# Patient Record
Sex: Male | Born: 1945 | Race: White | Hispanic: No | Marital: Married | Smoking: Former smoker
Health system: Southern US, Community
[De-identification: ages and names within clinical notes are randomized; demographics above are authoritative.]

## PROBLEM LIST (undated history)

## (undated) DIAGNOSIS — R0789 Other chest pain: Secondary | ICD-10-CM

## (undated) DIAGNOSIS — Z87442 Personal history of urinary calculi: Secondary | ICD-10-CM

## (undated) DIAGNOSIS — K219 Gastro-esophageal reflux disease without esophagitis: Secondary | ICD-10-CM

## (undated) DIAGNOSIS — I2510 Atherosclerotic heart disease of native coronary artery without angina pectoris: Secondary | ICD-10-CM

## (undated) DIAGNOSIS — E785 Hyperlipidemia, unspecified: Secondary | ICD-10-CM

## (undated) DIAGNOSIS — G4730 Sleep apnea, unspecified: Secondary | ICD-10-CM

## (undated) HISTORY — DX: Hyperlipidemia, unspecified: E78.5

## (undated) HISTORY — PX: FINGER SURGERY: SHX640

## (undated) HISTORY — DX: Other chest pain: R07.89

## (undated) HISTORY — PX: CORONARY ARTERY BYPASS GRAFT: SHX141

## (undated) HISTORY — PX: NO PAST SURGERIES: SHX2092

## (undated) HISTORY — DX: Atherosclerotic heart disease of native coronary artery without angina pectoris: I25.10

---

## 2001-06-27 ENCOUNTER — Ambulatory Visit (HOSPITAL_COMMUNITY): Admission: RE | Admit: 2001-06-27 | Discharge: 2001-06-28 | Payer: Self-pay | Admitting: Cardiology

## 2001-06-27 ENCOUNTER — Encounter: Payer: Self-pay | Admitting: Cardiology

## 2001-08-30 ENCOUNTER — Ambulatory Visit (HOSPITAL_BASED_OUTPATIENT_CLINIC_OR_DEPARTMENT_OTHER): Admission: RE | Admit: 2001-08-30 | Discharge: 2001-08-30 | Payer: Self-pay | Admitting: Urology

## 2003-01-17 ENCOUNTER — Inpatient Hospital Stay (HOSPITAL_COMMUNITY): Admission: RE | Admit: 2003-01-17 | Discharge: 2003-01-21 | Payer: Self-pay | Admitting: Cardiology

## 2003-01-17 ENCOUNTER — Encounter: Payer: Self-pay | Admitting: Cardiology

## 2008-10-28 DIAGNOSIS — Z87442 Personal history of urinary calculi: Secondary | ICD-10-CM | POA: Insufficient documentation

## 2008-10-28 DIAGNOSIS — I25708 Atherosclerosis of coronary artery bypass graft(s), unspecified, with other forms of angina pectoris: Secondary | ICD-10-CM

## 2008-10-28 DIAGNOSIS — E088 Diabetes mellitus due to underlying condition with unspecified complications: Secondary | ICD-10-CM

## 2008-10-28 DIAGNOSIS — I25709 Atherosclerosis of coronary artery bypass graft(s), unspecified, with unspecified angina pectoris: Secondary | ICD-10-CM | POA: Insufficient documentation

## 2008-10-28 DIAGNOSIS — E785 Hyperlipidemia, unspecified: Secondary | ICD-10-CM

## 2008-10-28 HISTORY — DX: Diabetes mellitus due to underlying condition with unspecified complications: E08.8

## 2008-10-28 HISTORY — DX: Atherosclerosis of coronary artery bypass graft(s), unspecified, with other forms of angina pectoris: I25.708

## 2008-10-28 HISTORY — DX: Atherosclerosis of coronary artery bypass graft(s), unspecified, with unspecified angina pectoris: I25.709

## 2008-10-28 HISTORY — DX: Personal history of urinary calculi: Z87.442

## 2008-10-29 ENCOUNTER — Encounter: Payer: Self-pay | Admitting: Cardiology

## 2008-10-29 ENCOUNTER — Ambulatory Visit: Payer: Self-pay | Admitting: Cardiology

## 2008-11-03 ENCOUNTER — Inpatient Hospital Stay (HOSPITAL_COMMUNITY): Admission: RE | Admit: 2008-11-03 | Discharge: 2008-11-04 | Payer: Self-pay | Admitting: Cardiology

## 2008-11-03 ENCOUNTER — Ambulatory Visit: Payer: Self-pay | Admitting: Cardiology

## 2008-11-12 ENCOUNTER — Telehealth (INDEPENDENT_AMBULATORY_CARE_PROVIDER_SITE_OTHER): Payer: Self-pay | Admitting: *Deleted

## 2010-09-07 ENCOUNTER — Inpatient Hospital Stay (HOSPITAL_COMMUNITY)
Admission: AD | Admit: 2010-09-07 | Discharge: 2010-09-17 | DRG: 234 | Disposition: A | Discharge status: Home Health | Payer: Medicare Other | Source: Other Acute Inpatient Hospital | Attending: Thoracic Surgery (Cardiothoracic Vascular Surgery) | Admitting: Thoracic Surgery (Cardiothoracic Vascular Surgery)

## 2010-09-07 DIAGNOSIS — E875 Hyperkalemia: Secondary | ICD-10-CM | POA: Diagnosis not present

## 2010-09-07 DIAGNOSIS — I2510 Atherosclerotic heart disease of native coronary artery without angina pectoris: Secondary | ICD-10-CM | POA: Diagnosis present

## 2010-09-07 DIAGNOSIS — I2582 Chronic total occlusion of coronary artery: Secondary | ICD-10-CM | POA: Diagnosis present

## 2010-09-07 DIAGNOSIS — Z9861 Coronary angioplasty status: Secondary | ICD-10-CM

## 2010-09-07 DIAGNOSIS — E119 Type 2 diabetes mellitus without complications: Secondary | ICD-10-CM | POA: Diagnosis present

## 2010-09-07 DIAGNOSIS — D62 Acute posthemorrhagic anemia: Secondary | ICD-10-CM | POA: Diagnosis not present

## 2010-09-07 DIAGNOSIS — E785 Hyperlipidemia, unspecified: Secondary | ICD-10-CM | POA: Diagnosis present

## 2010-09-07 DIAGNOSIS — Z7902 Long term (current) use of antithrombotics/antiplatelets: Secondary | ICD-10-CM

## 2010-09-07 DIAGNOSIS — G4733 Obstructive sleep apnea (adult) (pediatric): Secondary | ICD-10-CM | POA: Diagnosis present

## 2010-09-07 DIAGNOSIS — I214 Non-ST elevation (NSTEMI) myocardial infarction: Principal | ICD-10-CM | POA: Diagnosis present

## 2010-09-07 DIAGNOSIS — I10 Essential (primary) hypertension: Secondary | ICD-10-CM | POA: Diagnosis present

## 2010-09-07 DIAGNOSIS — I252 Old myocardial infarction: Secondary | ICD-10-CM

## 2010-09-07 DIAGNOSIS — Z87891 Personal history of nicotine dependence: Secondary | ICD-10-CM

## 2010-09-07 DIAGNOSIS — N179 Acute kidney failure, unspecified: Secondary | ICD-10-CM | POA: Diagnosis not present

## 2010-09-07 DIAGNOSIS — K560 Paralytic ileus: Secondary | ICD-10-CM | POA: Diagnosis not present

## 2010-09-07 DIAGNOSIS — I200 Unstable angina: Secondary | ICD-10-CM

## 2010-09-07 LAB — CARDIAC PANEL(CRET KIN+CKTOT+MB+TROPI)
CK, MB: 3 ng/mL (ref 0.3–4.0)
Relative Index: 2.3 (ref 0.0–2.5)
Troponin I: 0.17 ng/mL — ABNORMAL HIGH (ref 0.00–0.06)

## 2010-09-07 LAB — PLATELET INHIBITION P2Y12
P2Y12 % Inhibition: 13 %
Platelet Function  P2Y12: 219 [PRU] (ref 194–418)
Platelet Function Baseline: 253 [PRU] (ref 194–418)

## 2010-09-07 LAB — HEPARIN LEVEL (UNFRACTIONATED): Heparin Unfractionated: <0.1 IU/mL — ABNORMAL LOW (ref 0.30–0.70)

## 2010-09-07 LAB — GLUCOSE, CAPILLARY: Glucose-Capillary: 189 mg/dL — ABNORMAL HIGH (ref 70–99)

## 2010-09-08 ENCOUNTER — Inpatient Hospital Stay (HOSPITAL_COMMUNITY): Payer: Medicare Other

## 2010-09-08 DIAGNOSIS — I517 Cardiomegaly: Secondary | ICD-10-CM

## 2010-09-08 DIAGNOSIS — I2510 Atherosclerotic heart disease of native coronary artery without angina pectoris: Secondary | ICD-10-CM

## 2010-09-08 DIAGNOSIS — Z01810 Encounter for preprocedural cardiovascular examination: Secondary | ICD-10-CM

## 2010-09-08 LAB — COMPREHENSIVE METABOLIC PANEL
AST: 23 U/L (ref 0–37)
Albumin: 3.5 g/dL (ref 3.5–5.2)
Calcium: 9.5 mg/dL (ref 8.4–10.5)
Chloride: 106 mEq/L (ref 96–112)
Creatinine, Ser: 1.27 mg/dL (ref 0.4–1.5)
GFR calc Af Amer: >60 mL/min (ref >60)
Total Protein: 6.1 g/dL (ref 6.0–8.3)

## 2010-09-08 LAB — BLOOD GAS, ARTERIAL
Bicarbonate: 23.9 mEq/L (ref 20.0–24.0)
Drawn by: 270271
FIO2: 0.21 %
Patient temperature: 98.6
pH, Arterial: 7.413 (ref 7.350–7.450)
pO2, Arterial: 64.9 mmHg — ABNORMAL LOW (ref 80.0–100.0)

## 2010-09-08 LAB — LIPID PANEL
HDL: 29 mg/dL — ABNORMAL LOW (ref >39)
Total CHOL/HDL Ratio: 5.3 RATIO
Triglycerides: 461 mg/dL — ABNORMAL HIGH (ref <150)
VLDL: UNDETERMINED mg/dL (ref 0–40)

## 2010-09-08 LAB — TSH: TSH: 1.569 u[IU]/mL (ref 0.350–4.500)

## 2010-09-08 LAB — PROTIME-INR: Prothrombin Time: 13.6 seconds (ref 11.6–15.2)

## 2010-09-08 LAB — CBC
MCH: 27.5 pg (ref 26.0–34.0)
MCHC: 31.5 g/dL (ref 30.0–36.0)
Platelets: 259 10*3/uL (ref 150–400)
RBC: 4.77 MIL/uL (ref 4.22–5.81)
RDW: 13.4 % (ref 11.5–15.5)

## 2010-09-08 LAB — GLUCOSE, CAPILLARY
Glucose-Capillary: 167 mg/dL — ABNORMAL HIGH (ref 70–99)
Glucose-Capillary: 99 mg/dL (ref 70–99)
Glucose-Capillary: 114 mg/dL — ABNORMAL HIGH (ref 70–99)

## 2010-09-08 LAB — ABO/RH: ABO/RH(D): O POS

## 2010-09-08 LAB — CARDIAC PANEL(CRET KIN+CKTOT+MB+TROPI): Troponin I: 0.23 ng/mL — ABNORMAL HIGH (ref 0.00–0.06)

## 2010-09-08 LAB — HEMOGLOBIN A1C: Mean Plasma Glucose: 148 mg/dL — ABNORMAL HIGH (ref <117)

## 2010-09-08 NOTE — H&P (Signed)
NAME:  Glenn Roach, Glenn Roach NO.:  [id number]  MEDICAL RECORD NO.:  [id number]           PATIENT TYPE:  I  LOCATION:  2024                         FACILITY:  [facility]  PHYSICIAN:  Glenn Sidle, MD DATE OF BIRTH:  Jul 03, 1946  DATE OF ADMISSION:  09/07/2010 DATE OF DISCHARGE:                             HISTORY & PHYSICAL   PRIMARY CARDIOLOGIST:  Aundra Dubin. Revankar, MD  REASON FOR ADMISSION:  Unstable angina.  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  Please refer to the cardiac catheterization reports dating back to 2002 for details.  His most recent intervention was in April 2010 at which time he underwent placement of a drug-eluting stent within the right coronary artery with percutaneous intervention to the left anterior descending.  He follows with Dr. Tomie Roach and states that generally he has been feeling well until the last 24 hours.  Yesterday, he states that after walking up 1 flight of steps he felt unusually short of breath.  Later on that evening, he had some mild chest discomfort, however it resolved spontaneously.  Early this morning at around 6:20 he awoke with chest pressure reminiscent of angina, lasted for several minutes, and ultimately resolved with 2 nitroglycerin sprays.  With minimal exertion, he had recurrent symptoms over the morning and ultimately presented to the [facility] Emergency Department for further evaluation.  With low-level chest pain, his ECG showed a fairly impressive anterolateral ST-segment depression, which resolved following additional treatment  with nitroglycerin, and after being placed on heparin.  Cardiac markers have been equivocal so far with troponin-I level of 0.06, and he has not had any recurrent symptoms as yet.  He was transferred to [facility] for further management, and remains comfortable at this point.  ALLERGIES: 1. ALTACE. 2. MORPHINE. 3. History of neutropenia on TICLID.  MEDICATIONS: 1. Nitroglycerin spray p.r.n. 2. Enteric-coated aspirin 81 mg p.o. daily. 3. Centrum multivitamin one p.o. daily. 4. Toprol XL 50 mg p.o. daily. 5. Lipitor 40 mg p.o. at bedtime. 6. Janumet 50/1000 mg p.o. b.i.d. with meals. 7. Plavix 75 mg p.o. daily. 8. Omega-3 supplements 2000 mg p.o. b.i.d. 9. Pantoprazole 40 mg p.o. daily. 10.Exforge 04/319 mg p.o. daily. 11.Glimepiride 1 mg p.o. daily. 12.Fenofibrate 160 mg p.o. daily. At the present time he is also on heparin infusion and nitroglycerin paste.  PAST MEDICAL HISTORY:  Discussed above.  Records indicate placement of a bare-metal stent initially in the left anterior descending, subsequently bare-metal stents in the right coronary artery and mid left anterior descending with subsequent placement of drug-eluting stents in the proximal to mid RCA as well as proximal to mid LAD related to in-stent restenosis.  As noted above, most recent intervention was in 2010. Additional history includes nephrolithiasis requiring lithotripsy, occasional reflux symptoms, previous hand surgery.  SOCIAL HISTORY:  The patient is  married, lives in [place].  He is a retired Marine scientist.  Has a 25 pack-year history of tobacco use but quit smoking cigarettes in 1996.  No regular alcohol.  No regular exercise at this point.  FAMILY HISTORY:  The patient's mother died in her 50s with congestive heart failure and his father died in his 94s with a history of stroke. Two brothers with no clear history of cardiovascular disease.  REVIEW OF SYSTEMS:  As detailed above.  No recent fevers or  chills, no cough or hemoptysis.  No reported bleeding problems on aspirin and Plavix.  He does state that his statin medication was switched from Lipitor to Crestor and he had significant gastrointestinal side effects with this.  He is now working his Lipitor dose back up gradually to 80 mg a day, currently at 40 mg a day.  No melena or hematochezia. Otherwise reviewed and negative.  PHYSICAL EXAMINATION:  VITAL SIGNS:  Temperature is 98.1 degrees, heart rate 62 in sinus rhythm, respirations 18, blood pressure is 136/79, oxygen saturation is 98% on 2 L nasal cannula.  Weight is 96 kg. GENERAL:  This is an obese male in no acute distress without active chest pain. HEENT: Conjunctivae and lids are grossly normal.  Oropharynx clear. NECK:  Supple.  No elevated JVP or carotid bruits.  No thyromegaly. LUNGS:  Clear with clear without labored breathing at rest. CARDIAC:  Regular rate and rhythm, soft S4, no significant systolic murmur or pericardial rub. ABDOMEN:  Nontender.  Bowel sounds present. EXTREMITIES:  Exhibit no significant pitting edema.  Femorals and dorsalis pedis pulses as well as radial pulses are normal. SKIN:  Warm and dry. MUSCULOSKELETAL:  No kyphosis is noted. NEUROPSYCHIATRIC:  The patient is alert and oriented x3.  Affect is appropriate.  Laboratory data from [facility] shows a troponin-I level of 0.06, total CK of 96.  Sodium 142, potassium 5.0, chloride 105, glucose 165, BUN 20, creatinine 1.1, albumin 4.3.  ProBNP 245, AST 25, ALT 38, WBCs 5.8, hemoglobin 13.8, hematocrit 41.8, platelets 271.  C[facility] done at [facility] earlier today reports cardiomegaly with no active disease.  IMPRESSION: 1. Symptoms consistent with unstable angina, onset within the last 24     hours, and associated with dynamic ST-segment depression     principally in the anterolateral leads.  Troponin-I level from     [place] was in the equivocal range of 0.06, and at this point  he     is symptom free on nitroglycerin paste and heparin.  He reports     compliance with his medications, in particular dual antiplatelet     therapy, and has not had any progressive symptoms leading up to     this presentation. 2. Coronary artery disease status post remote myocardial infarction in     1996, status post multiple percutaneous interventions to the left     anterior descending and right coronary artery over the years, most     recently in 2010 with placement of a drug-eluting stent within the     right coronary artery and angioplasty to the left anterior     descending. 3. Type 2 diabetes mellitus. 4. Hypertension. 5. Hyperlipidemia.  The patient reports significant gastrointestinal     side effects related to Crestor, tolerating Lipitor at this point. 6. History of neutropenia on TICLID. 7. MORPHINE allergy.  PLAN:  The patient is now admitted to the telemetry unit, and is symptomatically and hemodynamically stable.  We will continue his home  medications with the exception of Janumet which will be held in anticipation of angiography, and otherwise continue nitroglycerin paste and heparin.  Labs will be repeated including continued cycling of cardiac markers and a PT/INR.  He will be scheduled for a diagnostic cardiac catheterization for tomorrow morning for reassessment of coronary anatomy and to assess for potential revascularizations options.     Glenn Sidle, MD     SGM/MEDQ  D:  09/07/2010  T:  09/07/2010  Job:  [id number]  cc:   Aundra Dubin. Revankar, M.D.  Electronically Signed by Nona Dell MD on 09/08/2010 11:30:47 AM

## 2010-09-09 ENCOUNTER — Inpatient Hospital Stay (HOSPITAL_COMMUNITY): Payer: Medicare Other

## 2010-09-09 DIAGNOSIS — I2510 Atherosclerotic heart disease of native coronary artery without angina pectoris: Secondary | ICD-10-CM

## 2010-09-09 LAB — POCT I-STAT 4, (NA,K, GLUC, HGB,HCT)
Glucose, Bld: 128 mg/dL — ABNORMAL HIGH (ref 70–99)
Glucose, Bld: 111 mg/dL — ABNORMAL HIGH (ref 70–99)
Glucose, Bld: 127 mg/dL — ABNORMAL HIGH (ref 70–99)
Glucose, Bld: 143 mg/dL — ABNORMAL HIGH (ref 70–99)
HCT: 40 % (ref 39.0–52.0)
HCT: 38 % — ABNORMAL LOW (ref 39.0–52.0)
HCT: 27 % — ABNORMAL LOW (ref 39.0–52.0)
HCT: 32 % — ABNORMAL LOW (ref 39.0–52.0)
Hemoglobin: 13.6 g/dL (ref 13.0–17.0)
Hemoglobin: 10.2 g/dL — ABNORMAL LOW (ref 13.0–17.0)
Hemoglobin: 9.2 g/dL — ABNORMAL LOW (ref 13.0–17.0)
Potassium: 4.3 mEq/L (ref 3.5–5.1)
Potassium: 5.5 mEq/L — ABNORMAL HIGH (ref 3.5–5.1)
Potassium: 4.4 mEq/L (ref 3.5–5.1)
Sodium: 141 mEq/L (ref 135–145)
Sodium: 140 mEq/L (ref 135–145)
Sodium: 135 mEq/L (ref 135–145)

## 2010-09-09 LAB — POCT I-STAT, CHEM 8
Creatinine, Ser: 1.3 mg/dL (ref 0.4–1.5)
Glucose, Bld: 159 mg/dL — ABNORMAL HIGH (ref 70–99)
HCT: 30 % — ABNORMAL LOW (ref 39.0–52.0)
Hemoglobin: 10.2 g/dL — ABNORMAL LOW (ref 13.0–17.0)
TCO2: 20 mmol/L (ref 0–100)

## 2010-09-09 LAB — POCT I-STAT 3, ART BLOOD GAS (G3+)
Bicarbonate: 24.7 mEq/L — ABNORMAL HIGH (ref 20.0–24.0)
O2 Saturation: 89 %
Patient temperature: 36.2
Patient temperature: 37.9
TCO2: 26 mmol/L (ref 0–100)
TCO2: 25 mmol/L (ref 0–100)
pCO2 arterial: 46.9 mmHg — ABNORMAL HIGH (ref 35.0–45.0)
pH, Arterial: 7.329 — ABNORMAL LOW (ref 7.350–7.450)
pH, Arterial: 7.332 — ABNORMAL LOW (ref 7.350–7.450)
pH, Arterial: 7.289 — ABNORMAL LOW (ref 7.350–7.450)
pO2, Arterial: 251 mmHg — ABNORMAL HIGH (ref 80.0–100.0)

## 2010-09-09 LAB — HEPARIN LEVEL (UNFRACTIONATED): Heparin Unfractionated: <0.1 IU/mL — ABNORMAL LOW (ref 0.30–0.70)

## 2010-09-09 LAB — MRSA PCR SCREENING: MRSA by PCR: NEGATIVE

## 2010-09-09 LAB — COMPREHENSIVE METABOLIC PANEL
AST: 22 U/L (ref 0–37)
Albumin: 3.8 g/dL (ref 3.5–5.2)
BUN: 11 mg/dL (ref 6–23)
Calcium: 9.4 mg/dL (ref 8.4–10.5)
Chloride: 109 mEq/L (ref 96–112)
Creatinine, Ser: 1.23 mg/dL (ref 0.4–1.5)
GFR calc Af Amer: >60 mL/min (ref >60)
Total Bilirubin: 0.7 mg/dL (ref 0.3–1.2)
Total Protein: 7 g/dL (ref 6.0–8.3)

## 2010-09-09 LAB — CBC
HCT: 31.7 % — ABNORMAL LOW (ref 39.0–52.0)
Hemoglobin: 13.6 g/dL (ref 13.0–17.0)
MCH: 28.3 pg (ref 26.0–34.0)
MCH: 28.2 pg (ref 26.0–34.0)
MCH: 27.8 pg (ref 26.0–34.0)
MCHC: 31.8 g/dL (ref 30.0–36.0)
MCV: 86.9 fL (ref 78.0–100.0)
MCV: 86.8 fL (ref 78.0–100.0)
Platelets: 244 10*3/uL (ref 150–400)
Platelets: 218 10*3/uL (ref 150–400)
RBC: 4.8 MIL/uL (ref 4.22–5.81)
RDW: 13.4 % (ref 11.5–15.5)
RDW: 13.4 % (ref 11.5–15.5)
WBC: 5.8 10*3/uL (ref 4.0–10.5)
WBC: 9.5 10*3/uL (ref 4.0–10.5)

## 2010-09-09 LAB — URINALYSIS, ROUTINE W REFLEX MICROSCOPIC
Bilirubin Urine: NEGATIVE
Nitrite: NEGATIVE
Specific Gravity, Urine: 1.018 (ref 1.005–1.030)
Urobilinogen, UA: 1 mg/dL (ref 0.0–1.0)
pH: 6 (ref 5.0–8.0)

## 2010-09-09 LAB — GLUCOSE, CAPILLARY
Glucose-Capillary: 104 mg/dL — ABNORMAL HIGH (ref 70–99)
Glucose-Capillary: 88 mg/dL (ref 70–99)
Glucose-Capillary: 144 mg/dL — ABNORMAL HIGH (ref 70–99)

## 2010-09-09 LAB — HEMOGLOBIN AND HEMATOCRIT, BLOOD
HCT: 28.6 % — ABNORMAL LOW (ref 39.0–52.0)
Hemoglobin: 9.3 g/dL — ABNORMAL LOW (ref 13.0–17.0)

## 2010-09-09 LAB — CREATININE, SERUM: GFR calc Af Amer: >60 mL/min (ref >60)

## 2010-09-09 LAB — APTT: aPTT: 30 seconds (ref 24–37)

## 2010-09-09 NOTE — Consult Note (Signed)
NAME:  Glenn Roach, AUXIER NO.:  [id number]  MEDICAL RECORD NO.:  [id number]           PATIENT TYPE:  I  LOCATION:  2024                         FACILITY:  [facility]  PHYSICIAN:  Salvatore Decent. Cornelius Moras, M.D. DATE OF BIRTH:  04/26/1946  DATE OF CONSULTATION:  09/08/2010 DATE OF DISCHARGE:                                CONSULTATION   REQUESTING PHYSICIAN:  Arturo Morton. Riley Kill, MD, [facility]  REASON FOR CONSULTATION:  Severe two-vessel coronary artery disease with class I unstable angina.  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  The patient's cardiac history dates back more than 15 years ago when he suffered an anterior wall myocardial infarction.  He was treated with PCI and stenting of both his left anterior descending coronary artery and the right coronary artery.  He has had numerous percutaneous coronary interventions since then, most recently in 2010 by Dr. Juanda Chance when stenosis of the distal right coronary artery was treated percutaneously.  The patient has continued to do well until yesterday morning.  He developed sudden onset of substernal chest tightness associated with shortness of breath.  Symptoms initially responded to sublingual nitroglycerin, but symptoms recurred on 3 more occasions ultimately prompting him to present to [facility].  Baseline electrocardiogram initially demonstrated some ST-segment depression across the anterolateral leads, but these ST changes resolved with medical therapy.  Cardiac enzymes have been borderline elevated consistent with unstable angina.  The patient was transferred to [facility] for further management.  Cardiac catheterization performed by Dr. Riley Kill this morning demonstrates severe two-vessel coronary artery disease with occlusion of the mid left  anterior descending coronary artery and high-grade 95% stenosis of mid right coronary artery.  Left ventricular function appears preserved.  The patient has had 2-3 brief episodes of substernal chest pain despite intravenous heparin since catheterization.  At present, the patient is pain free and elective cardiac surgical consultation has been requested.  REVIEW OF SYSTEMS:  GENERAL:  The patient reports normal appetite.  He has not been gaining nor losing weight recently.  RESPIRATORY: Negative.  The patient has intermittent nonproductive cough.  CARDIAC: Notable for new onset symptoms of unstable angina beginning yesterday. Prior to that, the patient reports feeling his usual self.  No recent history of any sort of chest pain nor shortness of breath.  The patient denies PND, orthopnea, or lower extremity edema.  The patient has not had dizzy spells or palpitations.  GASTROINTESTINAL:  Negative.  The patient has no difficulty swallowing.  He reports normal bowel function. He has occasional symptoms of reflux.  MUSCULOSKELETAL:  Negative. NEUROLOGIC:  Negative.  GENITOURINARY:  Negative.  HEENT:  Negative.  PAST MEDICAL HISTORY: 1. Coronary artery disease. 2. Hypertension. 3. Type 2 diabetes mellitus. 4. Hyperlipidemia. 5. Remote tobacco use. 6. GE reflux disease.  PAST SURGICAL HISTORY: 1. Right hand surgery. 2. Repair of blocked lacrimal ducts during childhood.  FAMILY HISTORY:  Noncontributory.  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  He lives a somewhat sedentary lifestyle.  He has a remote history of tobacco use, but he quit smoking 15 years ago.  He reports only social alcohol use.  MEDICATIONS PRIOR TO ADMISSION: 1. Lipitor 80 mg daily. 2. Metoprolol 50 mg daily. 3. Plavix 75 mg daily. 4. Fenofibrate 160 mg daily. 5. Iron. 6. Aspirin. 7. Exforge.. 8. Janumet 50/1000 one tablet twice daily with meals. 9. Fish oil  capsule twice daily. 10.Glimepiride 1 mg daily. 11.Multivitamin. 12.Protonix 40 mg daily. 13.Hydrocodone as needed. 14.Nitroglycerin spray as needed.  DRUG ALLERGIES: 1. ALTACE causes cough. 2. MORPHINE causes nausea. 3. TICLID causes neutropenia.  PHYSICAL EXAMINATION:  GENERAL:  The patient is a well-appearing, moderately obese male who appears his stated age, in no acute distress. HEENT:  Unrevealing. NECK:  Supple.  There is no cervical lymphadenopathy.  There is no jugular venous distention.  No carotid bruits noted. CHEST:  Auscultation of the chest demonstrates clear breath sounds that are symmetrical bilaterally.  No wheezes, rales, or rhonchi noted. CARDIOVASCULAR:  Regular rate and rhythm.  No murmurs, rubs, or gallops are appreciated. ABDOMEN:  Moderately obese, but soft and nontender.  Bowel sounds are present.  There are no palpable masses. EXTREMITIES:  Warm and well perfused.  There is no lower extremity edema.  Distal pulses are palpable in the posterior tibial position, both lower legs and at the ankle. RECTAL AND GU:  Both deferred. NEUROLOGIC:  Grossly nonfocal and symmetrical throughout.  DIAGNOSTIC TESTS:  Cardiac catheterization performed by Dr. Riley Kill is reviewed.  This is compared with previous catheterizations from 2010. Today's catheterization reveals 100% occlusion of the mid left anterior descending coronary artery.  There is high-grade 95% stenosis of mid right coronary artery.  There is right dominant coronary circulation. The distal left anterior descending coronary artery is faintly visualized on right-to-left collaterals, but not visualized well.  Left ventricular function appears preserved.  LABORATORY TESTS:  Routine bloodwork are all normal.  Plavix platelet function is assessed with the P2Y12 assay, and baseline platelet function is within normal range with only 13% functional inhibition.  IMPRESSION:  Severe two-vessel coronary artery  disease with restenosis and stent thrombosis following multiple previous percutaneous coronary interventions.  The patient has unstable angina.  Left ventricular function is preserved.  I agree that Mr. Gilmer would best be treated with surgical revascularization.  The patient has been taking Plavix but appears to have very low functional inhibition of platelet function.  PLAN:  I have discussed options at length with Mr. Petion and his family.  Alternative treatment strategies have been discussed.  They understand and accept all associated risks of surgery including but not limited to risks of death, stroke, myocardial infarction, congestive heart failure, respiratory failure, pneumonia, bleeding requiring blood transfusion, arrhythmia, infection, and late recurrence of coronary artery disease.  All of their questions have been addressed.  We plan to proceed with surgery tomorrow.     Salvatore Decent. Cornelius Moras, M.D.     CHO/MEDQ  D:  09/08/2010  T:  09/09/2010  Job:  [id number]  cc:   Aundra Dubin. Revankar, M.D. Beulah Gandy. Sheria Lang, MD  Electronically Signed by Tressie Stalker M.D. on 09/09/2010 06:33:32 AM

## 2010-09-10 ENCOUNTER — Inpatient Hospital Stay (HOSPITAL_COMMUNITY): Payer: Medicare Other

## 2010-09-10 LAB — CBC
HCT: 32.2 % — ABNORMAL LOW (ref 39.0–52.0)
Hemoglobin: 10.1 g/dL — ABNORMAL LOW (ref 13.0–17.0)
MCH: 27.9 pg (ref 26.0–34.0)
MCV: 89 fL (ref 78.0–100.0)
RBC: 3.62 MIL/uL — ABNORMAL LOW (ref 4.22–5.81)

## 2010-09-10 LAB — PREPARE FRESH FROZEN PLASMA
Unit division: 0
Unit division: 0

## 2010-09-10 LAB — PREPARE PLATELETS: Unit division: 0

## 2010-09-10 LAB — MAGNESIUM: Magnesium: 3.3 mg/dL — ABNORMAL HIGH (ref 1.5–2.5)

## 2010-09-10 LAB — BASIC METABOLIC PANEL
Calcium: 7.8 mg/dL — ABNORMAL LOW (ref 8.4–10.5)
GFR calc Af Amer: 44 mL/min — ABNORMAL LOW (ref >60)
GFR calc non Af Amer: 36 mL/min — ABNORMAL LOW (ref >60)
Potassium: 4.4 mEq/L (ref 3.5–5.1)
Sodium: 137 mEq/L (ref 135–145)

## 2010-09-10 LAB — GLUCOSE, CAPILLARY: Glucose-Capillary: 189 mg/dL — ABNORMAL HIGH (ref 70–99)

## 2010-09-11 ENCOUNTER — Inpatient Hospital Stay (HOSPITAL_COMMUNITY): Payer: Medicare Other

## 2010-09-11 LAB — BASIC METABOLIC PANEL
BUN: 28 mg/dL — ABNORMAL HIGH (ref 6–23)
CO2: 25 mEq/L (ref 19–32)
Chloride: 105 mEq/L (ref 96–112)
Chloride: 101 mEq/L (ref 96–112)
Creatinine, Ser: 2.48 mg/dL — ABNORMAL HIGH (ref 0.4–1.5)
GFR calc Af Amer: 32 mL/min — ABNORMAL LOW (ref >60)
GFR calc Af Amer: 31 mL/min — ABNORMAL LOW (ref >60)
GFR calc non Af Amer: 26 mL/min — ABNORMAL LOW (ref >60)
Potassium: 4.5 mEq/L (ref 3.5–5.1)
Sodium: 134 mEq/L — ABNORMAL LOW (ref 135–145)

## 2010-09-11 LAB — GLUCOSE, CAPILLARY
Glucose-Capillary: 122 mg/dL — ABNORMAL HIGH (ref 70–99)
Glucose-Capillary: 131 mg/dL — ABNORMAL HIGH (ref 70–99)
Glucose-Capillary: 168 mg/dL — ABNORMAL HIGH (ref 70–99)

## 2010-09-11 LAB — CBC
MCH: 28.1 pg (ref 26.0–34.0)
MCV: 89.8 fL (ref 78.0–100.0)
Platelets: 225 10*3/uL (ref 150–400)
RBC: 3.42 MIL/uL — ABNORMAL LOW (ref 4.22–5.81)
RDW: 14.1 % (ref 11.5–15.5)
WBC: 12 10*3/uL — ABNORMAL HIGH (ref 4.0–10.5)

## 2010-09-11 LAB — CROSSMATCH
ABO/RH(D): O POS
Antibody Screen: NEGATIVE
Unit division: 0

## 2010-09-12 ENCOUNTER — Inpatient Hospital Stay (HOSPITAL_COMMUNITY): Payer: Medicare Other

## 2010-09-12 LAB — CBC
Hemoglobin: 9.4 g/dL — ABNORMAL LOW (ref 13.0–17.0)
MCH: 28.2 pg (ref 26.0–34.0)
Platelets: 228 10*3/uL (ref 150–400)
RBC: 3.33 MIL/uL — ABNORMAL LOW (ref 4.22–5.81)
WBC: 9.5 10*3/uL (ref 4.0–10.5)

## 2010-09-12 LAB — BASIC METABOLIC PANEL
BUN: 37 mg/dL — ABNORMAL HIGH (ref 6–23)
CO2: 27 mEq/L (ref 19–32)
Chloride: 105 mEq/L (ref 96–112)
Chloride: 101 mEq/L (ref 96–112)
Creatinine, Ser: 2.6 mg/dL — ABNORMAL HIGH (ref 0.4–1.5)
GFR calc Af Amer: 30 mL/min — ABNORMAL LOW (ref >60)
GFR calc non Af Amer: 31 mL/min — ABNORMAL LOW (ref >60)
Glucose, Bld: 216 mg/dL — ABNORMAL HIGH (ref 70–99)
Potassium: 4.9 mEq/L (ref 3.5–5.1)
Sodium: 138 mEq/L (ref 135–145)

## 2010-09-12 LAB — GLUCOSE, CAPILLARY
Glucose-Capillary: 138 mg/dL — ABNORMAL HIGH (ref 70–99)
Glucose-Capillary: 140 mg/dL — ABNORMAL HIGH (ref 70–99)

## 2010-09-13 LAB — CBC
HCT: 28.3 % — ABNORMAL LOW (ref 39.0–52.0)
MCV: 87.1 fL (ref 78.0–100.0)
RBC: 3.25 MIL/uL — ABNORMAL LOW (ref 4.22–5.81)
WBC: 7.3 10*3/uL (ref 4.0–10.5)

## 2010-09-13 LAB — GLUCOSE, CAPILLARY

## 2010-09-13 LAB — BASIC METABOLIC PANEL
BUN: 34 mg/dL — ABNORMAL HIGH (ref 6–23)
CO2: 27 mEq/L (ref 19–32)
Chloride: 103 mEq/L (ref 96–112)
Glucose, Bld: 130 mg/dL — ABNORMAL HIGH (ref 70–99)
Potassium: 4 mEq/L (ref 3.5–5.1)

## 2010-09-14 ENCOUNTER — Inpatient Hospital Stay (HOSPITAL_COMMUNITY): Payer: Medicare Other

## 2010-09-14 LAB — BASIC METABOLIC PANEL
Calcium: 8.8 mg/dL (ref 8.4–10.5)
GFR calc Af Amer: 53 mL/min — ABNORMAL LOW (ref >60)
GFR calc non Af Amer: 44 mL/min — ABNORMAL LOW (ref >60)
Glucose, Bld: 133 mg/dL — ABNORMAL HIGH (ref 70–99)
Sodium: 139 mEq/L (ref 135–145)

## 2010-09-14 LAB — GLUCOSE, CAPILLARY

## 2010-09-14 LAB — CBC
HCT: 29.4 % — ABNORMAL LOW (ref 39.0–52.0)
MCHC: 32.3 g/dL (ref 30.0–36.0)
RDW: 14.2 % (ref 11.5–15.5)

## 2010-09-15 LAB — BASIC METABOLIC PANEL
GFR calc Af Amer: 59 mL/min — ABNORMAL LOW (ref >60)
GFR calc non Af Amer: 48 mL/min — ABNORMAL LOW (ref >60)
GFR calc non Af Amer: 49 mL/min — ABNORMAL LOW (ref >60)
Glucose, Bld: 130 mg/dL — ABNORMAL HIGH (ref 70–99)
Glucose, Bld: 181 mg/dL — ABNORMAL HIGH (ref 70–99)
Potassium: 4.2 mEq/L (ref 3.5–5.1)
Potassium: 4.1 mEq/L (ref 3.5–5.1)
Sodium: 137 mEq/L (ref 135–145)
Sodium: 136 mEq/L (ref 135–145)

## 2010-09-15 LAB — GLUCOSE, CAPILLARY
Glucose-Capillary: 150 mg/dL — ABNORMAL HIGH (ref 70–99)
Glucose-Capillary: 153 mg/dL — ABNORMAL HIGH (ref 70–99)
Glucose-Capillary: 123 mg/dL — ABNORMAL HIGH (ref 70–99)

## 2010-09-16 LAB — BASIC METABOLIC PANEL
BUN: 22 mg/dL (ref 6–23)
CO2: 27 mEq/L (ref 19–32)
Calcium: 9.1 mg/dL (ref 8.4–10.5)
Chloride: 101 mEq/L (ref 96–112)
Creatinine, Ser: 1.36 mg/dL (ref 0.4–1.5)
GFR calc Af Amer: >60 mL/min (ref >60)
GFR calc non Af Amer: 52 mL/min — ABNORMAL LOW (ref >60)
Glucose, Bld: 136 mg/dL — ABNORMAL HIGH (ref 70–99)
Potassium: 4.3 mEq/L (ref 3.5–5.1)
Sodium: 136 mEq/L (ref 135–145)

## 2010-09-16 LAB — GLUCOSE, CAPILLARY

## 2010-09-16 NOTE — Op Note (Signed)
NAME:  Glenn Roach, Glenn Roach NO.:  [id number]  MEDICAL RECORD NO.:  [id number]           PATIENT TYPE:  LOCATION:                                 FACILITY:  PHYSICIAN:  Salvatore Decent. Cornelius Moras, M.D. DATE OF BIRTH:  Oct 28, 1945  DATE OF PROCEDURE:  09/09/2010 DATE OF DISCHARGE:                              OPERATIVE REPORT   PREOPERATIVE DIAGNOSIS:  Severe two-vessel coronary artery disease with class IV unstable angina.  POSTOPERATIVE DIAGNOSIS:  Severe two-vessel coronary artery disease with class IV unstable angina.  PROCEDURE:  Median sternotomy for coronary artery bypass grafting x3 (left internal mammary artery to distal left anterior descending coronary artery, saphenous vein graft to second diagonal branch, saphenous vein graft to right posterolateral branch, endoscopic saphenous vein harvest from right thigh).  SURGEON:  Salvatore Decent. Cornelius Moras, MD  ASSISTANT:  Doree Fudge, PA  ANESTHESIOLOGIST:  Burna Forts, MD  BRIEF CLINICAL NOTE:  The patient is a 65 year old male with history of coronary artery disease, hypertension, hyperlipidemia, and type 2 diabetes mellitus.  The patient presents with unstable angina.  Cardiac catheterization demonstrates severe two-vessel coronary artery disease with progression of disease following multiple previous percutaneous coronary intervention procedures.  There is normal left ventricular function.  A full consultation note has been dictated previously.  The patient has been counseled at length regarding the indications, risks, and potential benefits of surgery.  The patient provides full informed consent for the surgery as described.  OPERATIVE FINDINGS: 1. Normal left ventricular systolic function with moderate left     ventricular hypertrophy. 2. Good-quality left internal mammary artery and saphenous vein     conduit for grafting. 3. Fair-quality target vessels for grafting.  OPERATIVE PROCEDURE IN DETAIL:   The patient was brought to the operating room on the above-mentioned date and central monitoring was established by the anesthesia team under the care and direction of Dr. Sharee Holster.  Specifically, a Swan-Ganz catheter was placed through the right internal jugular approach.  A radial arterial line was placed. Intravenous antibiotics were administered.  Following induction with general endotracheal anesthesia, a Foley catheter was placed.  The patient's chest, abdomen, both groins, and both lower extremities were prepared and draped in sterile manner.  Baseline transesophageal echocardiogram was performed by Dr. Jacklynn Bue.  This demonstrates normal left ventricular systolic function.  There was moderate left ventricular hypertrophy.  A median sternotomy incision was performed and the left internal mammary artery was dissected from the chest wall and prepared for bypass grafting.  Left internal mammary artery was good-quality conduit. Simultaneously, saphenous vein was obtained from the patient's right thigh using endoscopic vein harvest technique through a small incision made just above the right knee.  The saphenous vein was good-quality conduit.  After removal of the saphenous vein, the small incision in the right lower extremity was closed with absorbable suture.  Following systemic heparinization, the left internal mammary artery was transected distally and noted to have excellent flow.  The pericardium was opened.  The ascending aorta was normal in appearance.  The ascending aorta and the right atrium were cannulated for cardiopulmonary bypass.  Cardiopulmonary bypass  was begun and distal target vessels were selected for coronary bypass grafting.  A cardioplegic cannula was placed in the ascending aorta and a temperature probe was placed in the interventricular septum.  The patient was allowed to cool passively to 32 degrees systemic temperature.  The aortic cross-clamp was  applied and cold blood cardioplegia was delivered in antegrade fashion through the aortic root. Iced saline slush was applied for topical hypothermia.  The initial cardioplegic arrest was rapid with early diastolic arrest.  Repeat doses of cardioplegia were administered intermittently throughout the entire cross-clamp portion of the operation through the aortic root and down the subsequently placed vein grafts to maintain completely flat electrocardiogram and left ventricular septal myocardial temperature below 15 degrees centigrade.  The following distal coronary anastomoses were performed: 1. The posterolateral branch off the distal right coronary artery is     grafted with a saphenous vein graft in an end-to-side fashion.     This vessel measured 1.7 mm in diameter and is a fair to good     quality target vessel for grafting.  There was diffuse disease     proximally. 2. The second diagonal branch off the left anterior descending     coronary artery is grafted with a saphenous vein graft in end-to-     side fashion.  This vessel measured 1.5 mm in diameter and is a     fair to good quality target vessel for grafting. 3. The distal left anterior descending coronary artery is grafted with     left internal mammary artery in an end-to-side fashion.  This     vessel measured 1.5 mm in diameter and is a fair to good quality     target vessel for grafting.  There was diffuse disease proximally.  Both proximal saphenous vein anastomoses were performed directly to the ascending aorta prior to removal of the aortic cross-clamp.  The left ventricular septal temperature rises rapidly with reperfusion of the left internal mammary artery graft.  The aortic cross-clamp was removed after a total cross-clamp time of 65 minutes.  The heart began to beat spontaneously without need for cardioversion.  All proximal and distal coronary anastomoses were inspected for hemostasis and appropriate  graft orientation.  Epicardial pacing wires were fixed to the right ventricular free wall into the right atrial appendage.  The patient was rewarmed to 37 degrees centigrade temperature.  The patient was weaned from cardiopulmonary bypass without difficulty.  The patient's rhythm at separation from bypass was sinus rhythm.  Total cardiopulmonary bypass time for the operation is 89 minutes.  No inotropic support was required.  Followup transesophageal echocardiogram performed by Dr. Jacklynn Bue after separation from bypass demonstrates preserved left ventricular function.  The venous and arterial cannulae were removed uneventfully.  Protamine was administered to reverse the anticoagulation.  There was mild coagulopathy.  The patient was transfused two packs of platelets, 2 units of fresh frozen plasma.  The mediastinum and the left pleural space were irrigated with saline solution.  Meticulous surgical hemostasis was ascertained.  The mediastinum and the left pleural space were drained with three chest tubes placed through separate stab incisions inferiorly.  The pericardium and soft tissues anterior to the aorta were reapproximated loosely.  The sternum was closed using double- strength sternal wire.  The soft tissues anterior to the sternum were closed in multiple layers and the skin was closed with a running subcuticular skin closure.  The patient tolerated the procedure well and was transported to the  surgical intensive care unit in stable condition.  There were no intraoperative complications.  All sponge, instrument, and needle counts were verified correct at completion of the operation.     Salvatore Decent. Cornelius Moras, M.D.     CHO/MEDQ  D:  09/09/2010  T:  09/10/2010  Job:  [id number]  cc:   Arturo Morton. Riley Kill, MD, [facility] Aundra Dubin. Revankar, M.D. Desmond Dike, MD  Electronically Signed by Tressie Stalker M.D. on 09/15/2010 03:37:16 PM

## 2010-09-17 LAB — GLUCOSE, CAPILLARY
Glucose-Capillary: 147 mg/dL — ABNORMAL HIGH (ref 70–99)
Glucose-Capillary: 125 mg/dL — ABNORMAL HIGH (ref 70–99)

## 2010-09-18 NOTE — Discharge Summary (Signed)
NAME:  Glenn Roach, LUHMAN NO.:  [id number]  MEDICAL RECORD NO.:  [id number]           PATIENT TYPE:  I  LOCATION:  2017                         FACILITY:  [facility]  PHYSICIAN:  Salvatore Decent. Cornelius Moras, M.D. DATE OF BIRTH:  24-Sep-1945  DATE OF ADMISSION:  09/07/2010 DATE OF DISCHARGE:                              DISCHARGE SUMMARY   ADMITTING DIAGNOSES: 1. History of coronary artery disease (status post percutaneous     coronary intervention and stent of his left anterior descending     artery and right coronary artery).  Previous history of myocardial     infarction. 2. Non-ST segment elevation myocardial infarction. 3. History of hypertension. 4. History of hyperlipidemia. 5. History of diabetes mellitus. 6. History of tobacco abuse. 7. He history of obesity. 8. History of gastroesophageal reflux disease. 9. History of remote tobacco abuse.  DISCHARGE DIAGNOSES: 1. History of coronary artery disease (status post percutaneous     coronary intervention and stent of his left anterior descending     artery and right coronary artery).  Previous history of myocardial     infarction. 2. Non-ST segment elevation myocardial infarction. 3. History of hypertension. 4. History of hyperlipidemia. 5. History of diabetes mellitus. 6. History of tobacco abuse. 7. He history of obesity. 8. History of gastroesophageal reflux disease. 9. History of remote tobacco abuse. 10.Acute renal insufficiency. 11.Acute blood loss anemia.  PROCEDURES: 1. Cardiac catheterization performed by Dr. Riley Kill on September 08, 2010. 2. Two-D echocardiogram performed on September 08, 2010. 3. Median sternotomy for coronary artery bypass graft x3 (left     internal mammary artery to left anterior descending artery,     saphenous vein graft to diagonal 2, saphenous vein graft to right     posterior lateral branch with endoscopic vein harvesting from the     right side by Dr. Cornelius Moras on September 09, 2010. 4. Intraoperative TEE by Dr. Jacklynn Bue on September 10, 2010.  HISTORY OF PRESENT ILLNESS:  This is a 65 year old Caucasian male, patient of Dr. Kem Parkinson whose cardiac history dates back to more than 15 years ago.  At that time, he had suffered an anterior wall myocardial infarction and was treated with PCI stenting of both his LAD as well as right coronary artery.  He has had numerous percutaneous coronary interventions since then (most recently by Dr. Juanda Chance in 2010 when a stenosis of the distal RCA was treated percutaneously).  The patient had been doing fairly well until the morning of September 07, 2010, when he developed substernal chest tightness associated with shortness of breath.  His symptoms initially respond to sublingual nitroglycerin; however, these symptoms recurred at least three more times which ultimately prompted him to present to [facility].  Baseline electrocardiogram initially demonstrated some ST-segment depression across the anterolateral leads, but these ST changes resolve with medical therapy.  Cardiac enzymes have been borderline elevated consistent with unstable angina.  He was then transferred to [facility] for further evaluation and management.  A cardiac catheterization was performed by Dr. Riley Kill on September 08, 2010.  Results indicated severe two-vessel coronary artery disease  with occlusion of the mid LAD and a high-grade 95% stenosis of the mid right coronary artery.  Left ventricular function appeared to be preserved.  The patient then had 2-3 brief episodes of substernal chest pain despite IV heparin since catheterization.  A cardiothoracic consultation was then obtained with Dr. Cornelius Moras for consideration of coronary artery bypass grafting surgery. The patient underwent preoperative carotid duplex ultrasound, which showed no significant internal carotid artery stenosis bilaterally. ABIs on the right was found to be 1.3 and on the left 1.25.   Potential risks, complications and benefits of the surgery were discussed with the patient.  The patient agreed to proceed with surgery.  He underwent CABG x3 on September 09, 2010 with Dr. Cornelius Moras.  BRIEF HOSPITAL COURSE STAY:  The patient was extubated without difficulty early the evening of surgery.  He remained afebrile and hemodynamically stable.  He was on low-dose Neo-Synephrine and this was able to be weaned off.  His [place] A-line chest tubes and Foley were all removed early in his postoperative course.  He was started on a low- dose beta-blocker.  He was found to have acute blood loss anemia.  His H and H went as low as 9.1 and 28.3; however, his last H and H was 9.5 and 29.4, he did not require any postoperative transfusion.  In addition, he was found to have acute renal insufficiency.  His creatinine went as high as 2.6; however, his last creatinine was normalized down to 1.36. In addition, the patient has a history of diabetes mellitus.  His oral diabetic medicines were held until his creatinine was 1.36.  The Lantus that he had been on was discontinued and he was restarted on his Amaryl as well as Janumet.  His glucose remained well controlled.  He was felt surgically stable for transfer from the Intensive Care Unit to [facility] for further convalescence on September 10, 2010.  The patient continued to progress with cardiac rehab.  It should be noted, however, the patient did require several liters of oxygen via nasal cannula postoperatively. Currently, he is down to 2 liters of oxygen via nasal cannula and on this day on postop day #7, T-max 99.1 and became afebrile, heart rate in the 70, BP 118/66, preop weight is 90 kg, today's weight 97.5 kg.  CBGs are 131, 123 and 153 respectively.  PHYSICAL EXAMINATION:  CARDIOVASCULAR:  Regular rate and rhythm. PULMONARY:  Slight decreased at the bases. ABDOMEN:  Soft, nontender, is distended and has been, however, he has had previous bowel  movements.  He remains as stated nontender and he has had no nausea or emesis.  He does have good bowel sounds. EXTREMITIES:  Trace lower extremity edema.  His sternal and right lower extremity wounds are clean, dry and continuing to heal.  Provided the patient is weaned off O2 and he remains afebrile and hemodynamically stable and pending morning round evaluation, the patient has already been tolerating a diet and has had bowel movement, he was surgically stable for discharge on September 17, 2010.  Latest laboratory studies are as follows:  BMET done on September 16, 2010; potassium 4.3, sodium 136, BUN and creatinine 21 and 1.36 respectively. Last CBC done on September 14, 2010; H and H 9.5 and 29.4, white count is 7000, platelet count 307,000.  Last chest x-ray done on September 14, 2010, showed stable cardiomegaly, mild pulmonary venous hypertension without overt edema, bilateral pleural effusions with bibasilar atelectasis.  Discharge instructions include the following.  ACTIVITY:  The patient may walk up steps.  He may shower.  He is not to lift more than 10 pounds for 4 weeks.  He is not to drive until after 4 weeks.  He is to continue with his breathing exercise daily.  He is to walk every day and increase his frequency and duration as tolerates.  DIET:  Low-sodium, heart-healthy, diabetic diet.  WOUND CARE:  He is to use soap and water on his wounds.  He is to contact the office if any wound problems arise.  FOLLOWUP APPOINTMENTS: 1. The patient needs to contact Dr. Kem Parkinson office for followup     appointment in 2 weeks. 2. The patient has an appointment to see Dr. Orvan July PA on October 11, 2010 at 1:15 p.m.  Thirty minutes prior to this office appointment,     a chest x-ray will be obtained.  DISCHARGE MEDICATIONS: 1. Amlodipine 10 mg p.o. daily. 2. Robitussin DM syrup 15 mL p.o. q.4 h. p.r.n. cough. 3. Lopressor 12.5 mg p.o. two times daily. 4. Oxycodone 5 mg one to two tablets  every 4-6 hours as needed for     pain. 5. Enteric-coated aspirin 81 mg p.o. daily. 6. Centrum Silver over the counter one tablet p.o. daily. 7. Fenofibrate 160 mg p.o. daily. 8. Fish oil one tablet p.o. two times daily. 9. Amaryl 1 mg one half tablet p.o. daily. 10.Janumet 50/1000 mg one tablet p.o. two times daily. 11.Lipitor 40 mg p.o. at bedtime. 12.Pantoprazole 40 mg p.o. daily. 13.Plavix 75 mg p.o. daily.  Please note that the patient was not placed on an ACE inhibitor secondary to previously elevated creatnine and he has a preserved EF.     Doree Fudge, PA   ______________________________ Salvatore Decent Cornelius Moras, M.D.    DZ/MEDQ  D:  09/16/2010  T:  09/16/2010  Job:  [id number]  cc:   Aundra Dubin. Revankar, M.D. Arturo Morton. Riley Kill, MD, [facility]  Electronically Signed by Doree Fudge PA on 09/17/2010 09:28:31 AM Electronically Signed by Tressie Stalker M.D. on 09/18/2010 07:53:03 AM

## 2010-09-23 NOTE — Op Note (Signed)
NAME:  Glenn Roach, CAST NO.:  [id number]  MEDICAL RECORD NO.:  [id number]           PATIENT TYPE:  I  LOCATION:  2309                         FACILITY:  [facility]  PHYSICIAN:  Burna Forts, M.D.DATE OF BIRTH:  01-Sep-1945  DATE OF PROCEDURE:  09/10/2010 DATE OF DISCHARGE:                              OPERATIVE REPORT   INTRAOPERATIVE ANESTHESIA REPORT  INDICATIONS FOR PROCEDURE:  [name] is a 65 year old gentleman, a patient of Dr. Rollene Rotunda, who presented for coronary artery bypass grafting to be performed by Dr. Tressie Stalker.  He was brought to the holding area on the morning of surgery where he underwent local anesthesia with sedation.  The pulmonary artery catheter and radial arterial lines were placed.  He was then taken to the OR for routine induction of general anesthesia, after which the TEE probe is prepared and passed oropharyngeally to the stomach and slightly withdrawn for imaging of the cardiac structures.  PRECARDIOPULMONARY BYPASS TEE EXAMINATION:  Left ventricle.  The left ventricular chamber is seen initially in a short axis view.  There is good excellent overall contractile pattern appreciated.  There is mild- to-moderate left ventricular hypertrophy that is concentric in nature. Papillary muscles are well outlined.  There are no masses noted within.  Mitral valve.  The mitral valve apparatus is seen in the 4-chamber view. The leaflets are thin with only moderate thickness appreciated. Overall, there appears to be normal motion opening and coaptation of the mitral valve.  Color Doppler revealed trace mitral regurgitant flow.  Left atrium.  This is a normal left atrial chamber.  The interatrial septum is interrogated and is intact.  Right atrium.  Normal right atrial chamber is appreciated in size; however, there is a Chiari network appreciated in the upper area of the right atrium.  This is a normal variant.  Aortic valve.   Three cusps of the aortic valve are appreciated, and compliant, mobile, normal structures.  Right ventricular chamber is seen in several views in addition to the 4- chamber view.  It is of normal size and contractile pattern.  The patient placed was on cardiopulmonary bypass.  Coronary artery bypass grafting was carried.  Afterward, the patient is rewarmed and separated from cardiopulmonary bypass with the initial attempt.  POST CARDIOPULMONARY BYPASS TEE EXAMINATION (LIMITED EXAM):  Left ventricle.  The left ventricular chamber is seen initially in a short axis view.  Overall, there is mild diminution of the contractile pattern in this early bypass period with some mild to moderate hypokinesis appreciated initially in the inferior wall.  With time and separation of cardiopulmonary bypass, the overall contractile pattern appeared to be re-approve and returned to near baseline prior to leaving the operating room.  Short axis and long-axis views are carried out.  There are no major changes from the prebypass period except for the inferior hypokinesis noted, but that did improve prior to transfer.  Rest of the cardiac examination was without significant change.  The patient was returned to the cardiac intensive care unit in stable condition.          ______________________________ Burna Forts, M.D.  JTM/MEDQ  D:  09/09/2010  T:  09/10/2010  Job:  [id number]  Electronically Signed by Ester Rink M.D. on 09/23/2010 09:53:35 AM

## 2010-10-08 ENCOUNTER — Other Ambulatory Visit: Payer: Self-pay | Admitting: Thoracic Surgery (Cardiothoracic Vascular Surgery)

## 2010-10-08 DIAGNOSIS — I2510 Atherosclerotic heart disease of native coronary artery without angina pectoris: Secondary | ICD-10-CM

## 2010-10-11 ENCOUNTER — Encounter (INDEPENDENT_AMBULATORY_CARE_PROVIDER_SITE_OTHER): Payer: Self-pay

## 2010-10-11 ENCOUNTER — Ambulatory Visit
Admission: RE | Admit: 2010-10-11 | Discharge: 2010-10-11 | Disposition: A | Discharge status: Home / Self Care | Payer: Medicare Other | Source: Ambulatory Visit | Attending: Thoracic Surgery (Cardiothoracic Vascular Surgery) | Admitting: Thoracic Surgery (Cardiothoracic Vascular Surgery)

## 2010-10-11 DIAGNOSIS — I2510 Atherosclerotic heart disease of native coronary artery without angina pectoris: Secondary | ICD-10-CM

## 2010-10-12 NOTE — Assessment & Plan Note (Signed)
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  On September 09, 2010, he underwent coronary artery bypass grafting x3 by Dr. Tressie Stalker.  He is doing quite well. He does have some difficulty with sleeping as he normally is a side sleeper and has difficulty sleeping on his back.  He is having only minimal discomfort and uses a rare pain pill.  His chest tube incisions have had scant drainage, but he denies fevers, chills, or other constitutional symptoms.  Chest x-ray was obtained on today's date.  It shows a minimal left-sided pleural effusion.  There are no other significant findings.  PHYSICAL EXAMINATION:  VITAL SIGNS:  Blood pressure 104/73, pulse 76, respirations 20, oxygen saturation is 95% on room air.  GENERAL APPEARANCE:  Well-developed adult male in no acute distress.  PULMONARY: Clear lungs bilaterally.  CARDIAC:  Regular rate and rhythm.  Normal S1 and S2.  ABDOMEN:  Obese.  EXTREMITIES:  No edema.  Incisions healing well without evidence of infection.  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  He has also had some lower appetite and nausea, but this has also improved with hydration as well as stopping his amlodipine.  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  I have instructed him in regards to several postoperative questions regarding sleeping on his side and starting cardiac rehabilitation, driving, lifting restrictions.  He has seen Dr. Tomie China in followup and is scheduled for another appointment in approximately 2 weeks. He will start cardiac rehabilitation in approximately 1  week.  We will certainly see him at any time requested, but at this time there is no planned office visit followup.  Rowe Clack, P.A.-C.  Sherryll Burger  D:  10/11/2010  T:  10/12/2010  Job:  [id number]  cc:   Aundra Dubin. Revankar, M.D. Arturo Morton. Riley Kill, MD, [facility] Desmond Dike, MD

## 2010-10-20 LAB — BASIC METABOLIC PANEL
BUN: 14 mg/dL (ref 6–23)
Calcium: 9.8 mg/dL (ref 8.4–10.5)
Chloride: 108 mEq/L (ref 96–112)
GFR calc non Af Amer: 51 mL/min — ABNORMAL LOW (ref >60)
GFR calc non Af Amer: 54 mL/min — ABNORMAL LOW (ref >60)
Potassium: 4 mEq/L (ref 3.5–5.1)
Potassium: 4.8 mEq/L (ref 3.5–5.1)
Sodium: 140 mEq/L (ref 135–145)
Sodium: 140 mEq/L (ref 135–145)

## 2010-10-20 LAB — CBC
HCT: 36.4 % — ABNORMAL LOW (ref 39.0–52.0)
HCT: 37.2 % — ABNORMAL LOW (ref 39.0–52.0)
Hemoglobin: 11.8 g/dL — ABNORMAL LOW (ref 13.0–17.0)
Hemoglobin: 12.2 g/dL — ABNORMAL LOW (ref 13.0–17.0)
Platelets: 261 10*3/uL (ref 150–400)
Platelets: 279 10*3/uL (ref 150–400)
WBC: 5.1 10*3/uL (ref 4.0–10.5)
WBC: 6.9 10*3/uL (ref 4.0–10.5)

## 2010-10-20 LAB — LIPID PANEL
Cholesterol: 142 mg/dL (ref 0–200)
HDL: 30 mg/dL — ABNORMAL LOW (ref >39)
Total CHOL/HDL Ratio: 4.7 RATIO
Triglycerides: 124 mg/dL (ref <150)

## 2010-10-20 LAB — PROTIME-INR: INR: 1.1 (ref 0.00–1.49)

## 2010-10-20 LAB — GLUCOSE, CAPILLARY: Glucose-Capillary: 115 mg/dL — ABNORMAL HIGH (ref 70–99)

## 2010-10-21 NOTE — Procedures (Signed)
NAME:  Glenn Roach, Glenn Roach NO.:  [id number]  MEDICAL RECORD NO.:  [id number]           PATIENT TYPE:  LOCATION:                                 FACILITY:  PHYSICIAN:  Arturo Morton. 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  He presented to the hospital with recurrent chest pain.  We have known Dr. Mayford Knife well over many years, and he has had prior stenting of the LAD and right coronary artery with initially non- drug-eluting stents in the mid 90s and drug-eluting stents in the mid 2000s.  In 2010, he had a new stent put distally in the right coronary. He now presents with recurrent symptoms.  The patient has diabetes.  The current study was done to assess his coronary anatomy.  His cardiac enzymes are positive with slight bumps in troponins.  PROCEDURES: 1. Left heart catheterization. 2. Selective coronary arteriography. 3. Selective left ventriculography.  DESCRIPTION OF THE PROCEDURE:  The procedure was performed via the right radial artery.  A 3 mg of intra-arterial verapamil and  4500 units of intravenous heparin were administered.  A 5-French sheath was placed, views of the right and left coronary arteries were obtained in multiple angiographic projections.  Central aortic and left ventricular pressures were measured with pigtail.  Ventriculography was performed in the RAO projection.  There were no major complications.  I went out and spoke with his family following the procedure and spoke with the patient at the completion of the procedure.  HEMODYNAMIC DATA: 1. Central aortic pressure 109/64, mean 83. 2. LV pressure 109/60. 3. No gradient or pullback across the aortic valve.  ANGIOGRAPHIC DATA: 1. The left main coronary artery is free of critical disease. 2. The  left anterior descending artery courses to the apex.  There is     a septal perforator with some ostial narrowing and modest plaque of     about 50% in the proximal vessel.  This supplies a fairly large     first diagonal branch.  After this, there is a previously placed     stent which has diffuse in-stent restenosis and that is totally     occluded in its distal portion after the takeoff of a large second     diagonal.  The large second diagonal appears to be suitable for     grafting. 3. There is a fairly large ramus intermedius vessel that has minor     luminal irregularities with perhaps 30% mid narrowing but no     critical narrowing. 4. The AV circumflex provides tiny first marginal branch that is     insignificant diabetic-appearing artery without critical disease.     There is a second marginal branch also which fills slowly and has a     diabetic appearance.  The large third marginal branch appears     widely patent and does not have critical disease other than minor     luminal irregularity.  The  vessel terminates in the AV groove is an     AV circumflex. 5. The right coronary artery is an extensively stented vessel     throughout the proximal to the midvessel.  There is also a distal     stent.  The proximal mid stent has perhaps 30% in-stent restenosis     and just distal to the previously placed stent.  At the very     terminal edge is a high-grade 95% stenosis.  The vessel terminates     distally as a diabetic-appearing diffusely irregular PDA and a     large posterolateral branch, both of which appear to be reasonably     suitable for grafting.  Importantly, the proximal right coronary     artery supplies collaterals which then collateralizes the distal     LAD which fills somewhat suboptimally. 6. Ventriculography in the RAO projection reveals preserved global     systolic function.  Post systolically, overall systolic function is     normal.  There may be mild  hypokinesis on sinus beats in the     anterolateral segment.  Ejection fraction is in excess of 50%.  CONCLUSIONS:  Progressive coronary artery disease with total occlusion of the left anterior descending stent, high-grade stenosis distal to the right coronary artery stent, and compromised large diagonal branch.  DISPOSITION:  We will get a surgical consult.  My leaning would be in their direction given the patient's diabetes mellitus of a revascularization surgery.  This has been explained to the patient and the family.  They are agreeable to a surgical consult.  He will be taken to the 2000 unit and monitored.     Arturo Morton. Riley Kill, MD, [facility]     TDS/MEDQ  D:  09/08/2010  T:  09/09/2010  Job:  [id number]  cc:   CV laboratory Bruce R. Juanda Chance, MD, [facility] [employer] Aundra Dubin. Revankar, M.D.  Electronically Signed by Shawnie Pons MD [facility] on 10/21/2010 05:37:46 AM

## 2010-11-23 NOTE — Cardiovascular Report (Signed)
NAME:  OVERTON, BOGGUS NO.:  [id number]   MEDICAL RECORD NO.:  [id number]          PATIENT TYPE:  INP   LOCATION:  2506                         FACILITY:  [facility]   PHYSICIAN:  Everardo Beals. 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  He has had multiple prior  percutaneous coronary interventions.  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  Recently, he developed recurrent  symptoms and was seen by Dr. Sherril Croon and Dr. Riley Kill and scheduled for  evaluation angiography today.   PROCEDURE:  The procedure was performed by the femoral arterial sheath  and 5-French preformed coronary artery catheters.  A front wall arterial  puncture was performed and Omnipaque contrast was used.  After  completion of the diagnostic study,  we made a decision to proceed with  intervention on the lesions in the distal right coronary artery and mid  LAD.   The patient was given Angiomax bolus and infusion.  He was given  additional 300 mg of Plavix and he previously received chewable aspirin.  We approached the right coronary first.  We used a 6-French JR-4 guiding  catheter side hole and crossed the lesion with a Prowater wire without  difficulty.  We predilated with a 2.25 x 20-mm apex balloon performing 2  inflations up to 10 atmospheres for 30 seconds.  We then deployed a 2.5  x 23 mm Xience stent ending just at the bifurcation of the distal right  coronary artery with the post descending and posterolateral branches.  We deployed this with one  inflation at 12 atmospheres for 30 seconds.  We postdilated with a 3.0- x 15-mm  Voyager performing 2 inflations up  to 16 atmospheres for 30 seconds.   We then approached the in-stent restenotic lesion in the mid LAD.  We  passed a PT2 light support wire across the lesion down the LAD.  We  avoided the very distal LAD because it was severely and diffusely  diseased and we parked the wire in a diagonal side branch.  We  predilated the lesion with a 2.0- x 15-mm apex balloon performing one  inflation up to 8 atmospheres for 30 seconds.  We then used a 2.25- x 15-  mm cutting balloon and performed 3 inflations up to 10 atmospheres for  30 seconds.  There was a lesion just outside the stent, which we also  covered with a cutting balloon.  We then went back in with a 2.5- x 10-  mm cutting balloon and dilated twice more within the stent up to 10  atmospheres for 30 seconds.  Final diagnosis was then performed through  the guiding catheter.  The  patient tolerated the procedure well and left  the laboratory in satisfactory condition.   RESULTS:  The left main coronary artery.  The left main coronary artery  is free of significant disease.   Left anterior descending artery.  Please note the left descending artery  gave rise to 2 diagonal branches and several septal perforators.  There  was 50% narrowing before the first diagonal branch.  Three overlapping  stents began just after the first diagonal branch and crossed the second  diagonal branch.  There was diffuse 40% narrowing with 90% narrowing  focally in the distal portion of the stent.  There was 70% narrowing  just outside the stent.   The circumflex artery.  The circumflex artery gave rise to a ramus  branch, 2 marginal branches, and a large posterolateral branch.  These  vessels were free of significant disease.   The right coronary artery.  The right coronary artery is a large  dominant vessel gave rise to a conus branch, right  ventricular branch,  posterior descending branch, and a posterolateral branch.  The 3  overlapping stents in the proximal right coronary had 30-40% narrowing  in the midportion.  There was a 95% stenosis in the distal right  coronary artery, which extended to just before the bifurcation with the  posterior descending and posterolateral branches.   No left ventriculogram was performed to save contrast.   Following stenting of the lesion in the distal right coronary artery,  stenosis improved from 95% to 0%.   Following cutting balloon angioplasty of the lesion in the coronary  intervention as described above with 50% narrowing in the proximal LAD,  diffuse 40% narrowing within the 3 overlapping stents in the proximal to  mid-LAD with 90% focal stenosis in the mid-LAD within the stent and  diffuse distal disease with 95% stenosis in the very distal LAD, no  significant obstruction of circumflex artery, and 30-40% narrowing in  the mid-right coronary within the stent and 95% stenosis in the distal  right coronary artery.   Successful PCI of the lesion in the distal right coronary artery using a  Xience drug-eluting stent with improvement in center narrowing from 95%  to 0%.  Successful PCI of the in-stent restenotic lesion in the mid LAD using  cutting balloon angioplasty with improvement in center narrowing from  90% to less than 30%.   DISPOSITION:  The patient returned to [facility] room for further  observation.  We will recommend long-term Plavix.      Bruce Elvera Lennox Juanda Chance, MD, [facility]  Electronically Signed     BRB/MEDQ  D:  11/03/2008  T:  11/04/2008  Job:  [id number]   cc:   Doreen Beam, MD  Arturo Morton. Riley Kill, MD, [facility]  Cardiopulmonary Laboratory  Elicia Lamp, MD

## 2010-11-23 NOTE — Discharge Summary (Signed)
NAME:  Glenn Roach, Glenn Roach NO.:  [id number]   MEDICAL RECORD NO.:  [id number]          PATIENT TYPE:  INP   LOCATION:  2506                         FACILITY:  [facility]   PHYSICIAN:  Everardo Beals. Juanda Chance, MD, FACCDATE OF BIRTH:  Oct 19, 1945   DATE OF ADMISSION:  11/03/2008  DATE OF DISCHARGE:  11/04/2008                               DISCHARGE SUMMARY   PRIMARY CARDIOLOGIST:  Arturo Morton. Riley Kill, MD, [facility].   PRIMARY CARE PHYSICIAN:  Dr. Sherril Croon.   PROCEDURES PERFORMED DURING HOSPITALIZATION:  Cardiac catheterization  completed by Dr. Charlies Constable on November 03, 2008.  a.  LAD proximal 40% in-stent, mid with 90% diffuse disease, distal with  95%, circumflex okay, right coronary artery 30% mid in-stent, 95%  distal.  No LVG.  b.  Stent to the right coronary artery using a Xience drug-eluting  stent, 2.5 mm x 23 mm reducing it from 95% to 0%.  c.  PTCA of the LAD, reducing it from 90% to less than 30%.   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  He has a history of  CAD with prior stenting of the LAD and RCA with subsequent restenosis  and then retreatment using two different drug-eluting platform.  The  patient had an episode of chest pain back in January, took antacids with  some relief.  The patient began to have chest discomfort early this  month when he was walking up a hill in [place] similar to what he had  in the past.  Later, he had some shortness of breath similar to what he  had had in the past.  The patient was seen by Dr. Riley Kill in the office  on October 29, 2008, with a discussion of the patient's symptoms.  The  patient has agreed to proceed with cardiac catheterization secondary to  recurrent symptoms of angina and this was scheduled for November 03, 2008.   The patient did undergo cardiac catheterization as stated above with PCI  of the right coronary artery and PTCA of the LAD using a  Xience drug-  eluting stent to the right coronary artery.  The patient tolerated the  procedure well and had no complications postprocedure.  Following  morning, the patient was seen and examined by myself and Dr. Charlies Constable and found to be stable.  There was no evidence of bleeding,  hematoma, or infection at the right groin site.  He was without  discomfort.  Assessment did not reveal any signs of heart failure or any  other abnormalities.   DISCHARGE LABORATORIES:  Sodium 140, potassium 4.1, chloride 108, CO2 of  23, BUN 14, creatinine 1.3, glucose 121.  Total  cholesterol 142,  triglycerides 124, LDL 87, HDL 30.  Hemoglobin 11.8, hematocrit 36.4,  white blood cells 6.9, platelets 261.  Chest x-ray revealing coronary  artery stents with no active process.   Discharge EKG:  Normal sinus rhythm, sinus bradycardia, ventricular rate  of 59 beats per minute.   VITAL SIGNS AT DISCHARGE:  Blood pressure 122/59,  heart rate 62,  respirations 18, temperature 97.9, O2 sat 95% on room air.   DISCHARGE MEDICATIONS:  1. Enteric-coated aspirin 325 mg daily.  2. Metoprolol ER 50 mg daily.  3. Lipitor 80 mg daily.  4. Plavix 75 mg daily.  5. Prevacid 30 mg 1-2 tablets daily.  6. Exforge HCT 10/320/25 mg daily.  7. Glimepiride 1 mg daily.  8. TriCor 145 mg daily.  9. Janumet 50 mg/1000 mg daily, (The patient will not take until      Friday).  10.Nitroglycerin under the tongue p.r.n.  11.Fish oil 1 g twice a day.   ALLERGIES:  TICLID and MORPHINE.   FOLLOWUP PLANS AND APPOINTMENT:  1. The patient has been given post cardiac catheterization      instructions with particular emphasis on the right groin site for      evidence of bleeding, hematoma, or signs of infection.  2. The patient is to return to see Dr. Riley Kill on Tuesday, Nov 18, 2008, at 12 p.m.  3. The patient will follow up with Dr. Sherril Croon, primary care physician      for continued management of diabetes and other medical  issues.  4. The patient has been advised to bring all medications to followup      appointment.   TIME SPENT WITH THE PATIENT TO INCLUDE PHYSICIAN TIME:  35 minutes.      Bettey Mare. Lyman Bishop, NP      Everardo Beals. Juanda Chance, MD, [facility]  Electronically Signed    KML/MEDQ  D:  11/04/2008  T:  11/04/2008  Job:  [id number]   cc:   Dr. Sherril Croon

## 2010-11-26 NOTE — Cardiovascular Report (Signed)
NAME:  Glenn Roach, Glenn Roach NO.:  [id number]   MEDICAL RECORD NO.:  [id number]                   PATIENT TYPE:  OIB   LOCATION:  4711                                 FACILITY:  [facility]   PHYSICIAN:  Charlies Constable, M.D.                  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  He has had multiple previous percutaneous interventions.  He has had two overlaying 3.0 x 15-mm Palmaz-Schatz stents placed in the  proximal and mid LAD with subsequent cutting balloon angioplasty for in-  stent restenosis a year and a half ago.  He has also had stents placed in  the proximal and mid right coronary artery.  He was admitted last Friday  with unstable angina and had in-stent restenosis in both his right coronary  artery stents and in the stent in LAD.  We treated the right coronary artery  stents with drug-eluting stents with cutting balloon following by  overlapping drug-eluting stents in the proximal and mid right coronary  artery and brought him back today for intervention on the LAD and stent  restenosis.   PROCEDURE:  The procedure was performed via the left femoral artery using  arterial sheath and 8 [place] JL-4 guiding catheter with side holes.  The  patient was given Angiomax bolus and infusion and had been on Plavix. We  crossed the lesion in the LAD with a Rotafloppy wire without too much  difficulty.  Initially, went in with a 1.75 bur and performed approximately  six runs for approximately 10 seconds each.  We then IVUS the lesion  performed passing an Atlantis IVUS catheter across the lesion with automatic  pullback.  Following pullback of the IVUS, the patient developed chest pain  and slow flow down the LAD which was treated with nitroglycerin and  Verapamil.  These symptoms  then resolved.  The IVUS documented a distal  reference vessel was 2.5 and we elected to perform further cutting balloon  within the stents and then place drug-eluting stents within the old stents.  We used a 2.5 x 50-mm cutting balloon and performed approximately  five  inflations within both stents up to 8 atmospheres for 30 seconds.  We then  passed a 2.5 x 28-mm Cypher stent just distal to the second of the two  Palmaz-Schatz stents.  We deployed this with one inflation of 10 atmospheres  for 30 seconds and a second inflation of 14 atmospheres for 30 seconds with  the balloon inside the distal edge.  We then deployed a second 2.5 x 13-mm  Cypher stent overlapping the first stent and ending just after the first  large diagonal branch right at the proximal edge of the old Palmaz-Schatz  stent.  We deployed  this with one inflation of 14 atmospheres for 30 seconds  and then we moved the balloon inside the two stents and performed two more  inflations up to 16 atmospheres for 30 seconds.  Repeat diagnostics were  then performed through the guiding catheter.   The procedure was long and the patient had quite a bit of pain.  Some of the  pain was  just with the dye injection and some of the pain was with the slow  flow and some of the pain was with balloon inflations.  He received 9 mg of  IV Valium and 275 mg of fentanyl.  Despite overall, he tolerated the  procedure well and left the laboratory in satisfactory condition.   RESULTS:  Initially, the stenosis within the two Palmaz-Schatz stents was  diffuse with focal narrowing as  high as 90%.  Following the stenting, this  improved to 10%.   CONCLUSIONS:  1. Successful rotational arthrectomy and stenting of the proximal to mid     left anterior descending artery in-stent restenosis using two Cypher     stents with improvement in stent narrowing from 90% to 10%.   DISPOSITION:  The patient to the floor for further observation.                                                  Charlies Constable, M.D.    BB/MEDQ  D:  01/20/2003  T:  01/20/2003  Job:  [id number]  Dr. Desmond Dike   Heide Guile, MD  [address]  [place]  [place] [address]  Fax: [phone]   cc:   Dr. Desmond Dike   Heide Guile, MD  [address]  [place]  [place] [address]  Fax: [phone]

## 2010-11-26 NOTE — Discharge Summary (Signed)
NAME:  EAN, GETTEL                        ACCOUNT NO.:  [id number]   MEDICAL RECORD NO.:  [id number]                   PATIENT TYPE:  OIB   LOCATION:  6533                                 FACILITY:  [facility]   PHYSICIAN:  Charlies Constable, M.D.                  DATE OF BIRTH:  06-05-1946   DATE OF ADMISSION:  01/17/2003  DATE OF DISCHARGE:  01/21/2003                           DISCHARGE SUMMARY - REFERRING   PROCEDURES:  1. Cardiac catheterization.  2. Coronary arteriogram.  3. Left ventriculogram.  4. Percutaneous transluminal coronary angiography and stent x 2 to one     vessel.  5. Recatheterization and percutaneous transluminal coronary angiography and     Cypher stent x 2 with high-speed rotational atherectomy to the second     vessel.   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  He has a history of coronary artery  disease starting with an MI in 1996.  His last intervention was 2002 with  high-speed rotational atherectomy to the LAD, as well as PTCA and a cutting  balloon to the LAD and RCA with restent restenosis.  Additionally, he had a  second stent placed in the RCA.  He has been having episodic chest pain and  saw his primary cardiologist, who set him up to be admitted for cardiac  catheterization.   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  The catheterization showed a normal left main and culprit  lesions in both the LAD and RCA with in-stent restenosis 80-90% and a normal  EF.  The films were reviewed by Dr. Juanda Chance and it was felt that intervention  on both vessels was indicated.  He was enrolled in the MATRIX trial.  He was  initially treated for in-stent restenosis to the RCA, reducing that stenosis  from 90% to less than 10% and 80% to less than 10%.  TAXUS stent and a  cutting balloon were used.  He tolerated the procedure well.  A staged  procedure was planned for the LAD on January 20, 2003.   On January 20, 2003, Dr. Mayford Knife was taken back to the lab and he had  intravascular ultrasound and two Cypher stents placed to the LAD for in-  stent restenosis.  That stenosis was reduced from 90% to less than 10%.  He  tolerated the procedure well.  The sheath was removed without difficulty.   By January 21, 2003, Dr. Mayford Knife was ambulating without chest pain or  shortness of breath.  He had his Altace increased from 2.5 mg to 5 mg daily  for better blood pressure control and was placed on Plavix for at least six  months.  He is to follow up with Dr. Sylvie Farrier in one week.  He was considered  stable for discharge on January 21, 2003.   LABORATORY  VALUES:  Hemoglobin 13.4, hematocrit 39.1, WBC 5.8, platelets  193.  Post procedure CK-MB 60/2.3.  Sodium 139, potassium 4.1, chloride 104,  CO2 28, BUN 17, creatinine 1.0, glucose 133.  Other CMET values within  normal limits.  Total cholesterol 153, triglycerides 260, HDL 32, LDL 69.   Chest x-ray with COPD, cardiomegaly, minimal spur formation of thoracic  spine, and osteoporosis seen.   CONDITION ON DISCHARGE:  Improved.   DISCHARGE DIAGNOSES:  1. Unstable anginal pain, status post percutaneous transluminal coronary     angiography with a cutting balloon and TAXUS stent to two lesions in the     right coronary artery, as well as two Cypher stents to the left anterior     descending, all for in-stent restenosis.  2. Preserved left ventricular function with an ejection fraction of 60% by     catheterization this admission.  3. Status post myocardial infarction in 1996 with percutaneous transluminal     coronary angiography to the left anterior descending.  4. Status post stent x 2 to the right coronary artery in 1998.  5. High-speed rotational atherectomy and percutaneous transluminal coronary     angiography with a cutting balloon to both the right coronary artery and     left anterior  descending in 2002 for in-stent restenosis.  6. Dyslipidemia.  7. Family history of peripheral vascular disease.  8. Remote history of tobacco use.  9. Adult onset diabetes.  10.      History of nephrolithiasis.  11.      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  12.      Intolerance to morphine.  13.      History of hematuria and microalbuminuria.  14.      Occasional reflux symptoms.  15.      Status post hand surgery.   DISCHARGE INSTRUCTIONS:  1. His activity level is to include no driving for two days and he may     return to work in one week.  2. He is to stick to a low-fat diabetic diet.  3. He is to call the office for problems with the catheterization site.  4. He is enrolled in the MATRIX study.  5. He is to see Dr. Sylvie Farrier next week and Dr. Sheria Lang as scheduled.   DISCHARGE MEDICATIONS:  1. Nitroglycerin sublingual p.r.n.  2. Lopressor 50 mg one-half tablet b.i.d.  3. Avandomet 10/998 b.i.d.  Restart Thursday.  4. Aspirin 325 mg daily.  5. Lopid 600 mg b.i.d.  6. Plavix 75 mg daily.  7. Lipitor 60 mg daily.  8. Altace 5 mg daily.  9. Vitamin E 800 international units daily.     Lavella Hammock, P.A. [facility]                  Charlies Constable, M.D.    RG/MEDQ  D:  01/21/2003  T:  01/21/2003  Job:  [id number]   cc:   Charlies Constable, M.D.   Heide Guile, MD  [address]  [place]  [place] [address]  Fax: [phone]   Dr. Netta Cedars, [place]    cc:   Charlies Constable, M.D.   Heide Guile, MD  [address]  [place]  [place] [address]  Fax: [phone]   Dr. Netta Cedars, [place]

## 2010-11-26 NOTE — H&P (Signed)
NAME:  Glenn Roach, CELLUCCI NO.:  [id number]   MEDICAL RECORD NO.:  [id number]                   PATIENT TYPE:  OIB   LOCATION:  6529                                 FACILITY:  [facility]   PHYSICIAN:  Lavella Hammock, P.A. [facility]            DATE OF BIRTH:  1945/08/04   DATE OF ADMISSION:  01/17/2003  DATE OF DISCHARGE:                                HISTORY & PHYSICAL   PRIMARY CARDIOLOGIST:  Heide Guile, M.D.   CHIEF COMPLAINT:  Chest pain.   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  He began having episodic substernal  chest pain, described as a burning and a 4/10.  It was relieved either by  rest or sublingual nitroglycerin or antacids.  The duration was generally  five to ten minutes and it was not associated with shortness of breath,  nausea, vomiting or diaphoresis.  He had episodes consistently with exertion  but some episodes were postprandial.  The symptoms were exactly the same  each time.  If the symptoms postprandial did not resolve with antacids, he  would try nitroglycerin which then worked.  He has been unable to exercise  recently secondary to chest pain, but otherwise, denies any recent fatigue,  shortness of breath or exertional dyspnea.   PAST MEDICAL HISTORY:  1. 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  2. Status post stent to the RCA x2 in 1998.  3. Status post cardiac catheterization in 2002 with left main okay.  LAD     80%, treated with HSRA, less than 20% and then PTCA with a coating     balloon in the LAD and RCA and stent restenosis.  He had an additional     stent placed in the RCA.  There was also 40% proximal end-stent     restenosis in the proximal RCA stent.  His EF was preserved at that time.  4. Hyperlipidemia/dyslipidemia.  5. Family history of peripheral vascular disease.  6. Remote history of tobacco use.  7. Adult onset diabetes.  8. Nephrolithiasis.  9. 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   PAST SURGICAL HISTORY:  Cardiac catheterization x3 and lithotripsy x2.   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  He has  approximately a 25 pack-year history of tobacco use, but quit in 1996.  He  does not abuse alcohol or drugs.  He exercises regularly on a treadmill when  he is not having chest pain with this.  He eats a generally healthy diet.   FAMILY HISTORY:  His mother died of CHF in her 72's; father died in his 17's  with CVA.  He has two brothers, neither of which has coronary artery  disease.   ALLERGIES:  He had  neutropenia with TICLID and gets nauseated and vomiting  secondary to MORPHINE.   MEDICATIONS:  1. Nitroglycerin p.r.n.  2. Metoprolol 50 mg 1/2 tablet b.i.d.  3. Avandia 10/998 mg p.o. b.i.d.  4. Altace 2.5 mg daily.  5. Lasix 20 mg b.i.d.  6. Vitamin E 800 International Units daily.  7. Lipitor 60 mg daily.   REVIEW OF SYSTEMS:  Positive for chest pain as described above.  He also has  a history of hematuria secondary to kidney stones.  He has occasional  arthralgias, but nothing consistent.  It is possible that he also has  occasional reflux symptoms but he is not able to completely differentiate  these from anginal symptoms.   PHYSICAL EXAMINATION:  VITAL SIGNS:  Temperature 98.1, blood pressure  133/80, heart rate 59, irregular, respirations 16 and not labored.  GENERAL:  Well-developed, well-nourished white male in no acute distress.  HEENT:  He is normocephalic and atraumatic, with pupils equal, round and  reactive to light and accommodation.  Extraocular movements are intact.  Sclerae are clear and nose is without discharge.  NECK:  Supple without lymphadenopathy, thyromegaly, bruit or JVD.  CV:  Heart is regular in rate and rhythm with an S1 and S3, possible S4.  There is no  significant murmur or rub.  EXTREMITIES:  Distal pulses are 2+ in all four extremities and no femoral  bruits are appreciated.  There is no cyanosis, clubbing or edema.  There are  no rashes, lesions or petechiae.  He has no joint deformity and no spinal or  CVA tenderness.  LUNGS:  Clear to auscultation bilaterally.  SKIN:  No rashes or lesions.  ABDOMEN:  Firm and nontender with active bowel sounds and no  hepatosplenomegaly is appreciated by percussion.  NEUROLOGIC:  Alert and oriented x3 with cranial nerves II-XII  grossly  intact.  Strength 5/5 all extremities.   STUDIES:  Chest x-ray is pending.  EKG is pending.   LABORATORY DATA:  Sodium 140, potassium 5.1, chloride 105, CO2 30, BUN 18,  creatinine 1.1, glucose 90.  Other CMET valves within normal limits.  Hemoglobin 14.8, hematocrit 43.4, WBC 4.5, platelets 225.  PTT 31 with an  INR of 1.1.   ASSESSMENT/PLAN:  1. Chest pain, unstable anginal pain and he is for cardiac catheterization     today.  2. Reflux symptoms.  The patient occasionally takes over-the-counter Pepcid.     We will give him Nexium while he is in the hospital and he can followup     with continuing over-the-counter medications or with his primary care     physician.  3. The patient is otherwise stable and is followed closely by his primary     cardiologist and primary care physician in [place].                                                 Lavella Hammock, P.A. [facility]    RG/MEDQ  D:  01/17/2003  T:  01/18/2003  Job:  [id number]   cc:   Dr. Netta Cedars, [place]   Heide Guile, MD  [address]  [place]  [place] [address]  Fax: [phone]    cc:   Dr. Netta Cedars, [facility]   Heide Guile, MD  [address]  [place]  [place] [address]  Fax: [phone]

## 2010-11-26 NOTE — Discharge Summary (Signed)
[place]. [facility]  Patient:    Glenn Roach, Glenn Roach Visit Number: [id number] MRN: [id number]          Service Type: CAT Location: 3700 [id number] Attending Physician:  Lenoria Farrier Dictated by:   Rozell Searing, P.A. Admit Date:  06/27/2001 Discharge Date: 06/28/2001   CC:         Dr. Desmond Dike of [place]  Dr. Gilford Raid Harsh of [place]   Referring Physician Discharge [place]  REASON FOR ADMISSION:  [name] is a 65 year old male with documented coronary artery disease status post multiple prior percutaneous interventions who presented for elective coronary angiography for evaluation of two-week history of exertional chest discomfort.  HOSPITAL COURSE:  Coronary angiogram performed by Dr. Ephraim Hamburger (see report for full details) revealed a 90% in-stent RCA, 70% in-stent LAD lesion, and an 80% mid LAD lesion.  All were successfully treated with percutaneous intervention, with PTCA of the in-stent RCA lesion and HSRA/PTCA of both the in-stent and mid LAD lesions.  Residual anatomy revealed normal circumflex and left ventriculogram was normal.  The patient was cleared for discharge the following morning in hemodynamically-stable condition.  Dr. Juanda Chance recommended maintenance on baby aspirin/Plavix x 1 month.  He was also to defer lithotripsy for one month.  LABORATORY DATA:  WBC 8, HGB 12.9, HCT 36.1, platelet 262 at discharge. Sodium 138, potassium 3.5, glucose 213, BUN 6, creatinine 0.9 at discharge. Post intervention CPK 66/2.6.  DISCHARGE MEDICATIONS: 1. Plavix 75 mg q.d. (x 1 month). 2. Coated aspirin 81 mg q.d. 3. Lopid 600 mg b.i.d. 4. Lopressor 25 mg t.i.d. 5. Lipitor 60 mg q.d. 6. Amaryl 2 mg q.d.  DISCHARGE DIAGNOSES: 1. Coronary artery disease progression.    a. Status post percutaneous transluminal coronary angioplasty in-stent       right coronary artery; high-speed rotational atherectomy/percutaneous       transluminal  coronary angioplasty in-stent left anterior descending       artery and mid left anterior descending artery on June 27, 2001.    b. Normal left ventricle. 2. Nephrolithiasis - recommend deferring lithotripsy x 1 month. 3. Type 2 diabetes mellitus. 4. Dislipidemia. Dictated by:   Rozell Searing, P.A. Attending Physician:  Lenoria Farrier DD:  06/28/01 TD:  06/28/01 Job: [id number]

## 2010-11-26 NOTE — Cardiovascular Report (Signed)
NAME:  Glenn Roach NO.:  [id number]   MEDICAL RECORD NO.:  [id number]                   PATIENT TYPE:  OIB   LOCATION:  4711                                 FACILITY:  [facility]   PHYSICIAN:  Charlies Constable, M.D.                  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  He has had  previous stents placed to the proximal and mid right coronary artery and  LAD.  A year and a half ago he had cutting balloon angioplasty for in-stent  restenosis in the LAD and he had cutting balloon angioplasty for in-stent  restenosis in the mid right coronary stent with a new stent placed at the  distal portion of the stent in the mid RN overlapping this stent.  He  returns now with one week history with symptoms consistent with progressive  angina.   PROCEDURE:  The procedure was performed via the right femoral artery using  arterial sheath and 6 French preformed coronary catheters.  A front wall  arterial puncture was performed and Omnipaque contrast was used.  After  completion of the diagnostic study, we made a decision to proceed with  intervention on the tandem in-stent restenosis lesions in the proximal and  mid right coronary artery with plans to stage the intervention on the lesion  in the LAD within the stent.   The patient was given Angiomax bolus and infusion was given 300 mg of  Plavix.  We used a JL-4 6 [place] guiding catheter with side holes and a Luge  wire.  We crossed the lesion with the wire without difficulty.  We first  used a 3.25 x 50-mm cutting balloon and performed two inflations up to 10  atmospheres for 30 seconds in the proximal lesion.  We were unable to  advance the cutting balloon across the mid lesion despite the  fact that this  lesion did not appear as tight.  We used a 3.25 x 20-mm Maverick and  predilated the lesion in the mid coronary artery within the stent with two  inflations up to 8 atmospheres for 30 seconds.  We then deployed a 3.0 x 28-  mm Taxus stent in the mid portion of the right coronary artery within the  two stents in the right coronary artery.  We positioned the distal edge of  stent right inside the distal edge of the previous second stent.  We  deployed this with one inflation up to 18 atmospheres for 30 seconds.  We  then deployed a second Taxus stent which was 3.0 x 20 mm overlapping the  first stent and extending into the proximal lesion and covering the proximal  old stent.  We deployed this  with one inflation of 16 atmospheres for 30  seconds.  We then post dilated within the two new overlapping stents with a  3.5 x 20-mm Quantum Maverick performing three inflations up to 14  atmospheres for 30 seconds.  Repeat diagnostic study was then performed  through the guiding catheter.  The patient tolerated the procedure well and  left the laboratory in satisfactory condition.   RESULTS:  The aortic pressure was 04540 with a mean of 98.  Left ventricular  pressure was 129/22.   Left main coronary artery:  The left main coronary was free of significant  disease.   Left anterior descending artery:  Left anterior descending artery gave rise  to a large septal perforator and two diagonal branches.  There was a stent  which extended from just distal to the first diagonal branch to just past  the second diagonal branch.  There was diffuse in-stent restenosis estimated  at 90%.  Distal vessel appeared to be approximately 2.25 in luminal  diameter.   Circumflex artery:  The circumflex artery gave rise to two small marginal  branches and a posterior lateral branch.  These vessels were free of  significant disease.   Right coronary artery:  The right coronary artery was a large  dominant  vessel that gave rise to a clonus branch, right ventricular branch,  posterior descending branch, posterior lateral branch.  There was 90%  diffuse in-stent restenosis within the stent in the proximal right coronary  artery.  There was 80% diffuse in-stent restenosis within the two  overlapping stents in the mid right coronary artery.   LEFT VENTRICULOGRAM:  The left ventriculogram performed in the RAO  projection showed good wall motion with no areas of hypokinesis.  The  estimated ejection fraction was 60%.   Following placement of tandem overlapping Taxus stents for in-stent  restenosis within the proximal stent and mid stent in the right coronary  artery, the proximal stenosis improved from 90% to 10% and the mid stenosis  improved from 90% to 10%.   CONCLUSIONS:  1. Coronary artery disease status multiple percutaneous interventions as     described above with 90% diffuse in-stent restenosis in the proximal to     mid left anterior descending artery, no major obstruction in the     circumflex artery, 90% in-stent restenosis within the stent in the     proximal right coronary artery and 80% in-stent restenosis within the     stent in the mid right coronary artery and normal LV function.  2. Successful placement of tandem overlying stents in the proximal and mid     right coronary artery within the previous two stents for in-stent     restenosis with improvement in the proximal stenosis from 90% to 10% and     improvement in the mid stenosis from 80% to 10%.   DISPOSITION:  The patient will return __________ for further observation.  Will plan intervention on the LAD next week.                                                   Charlies Constable, M.D.    BB/MEDQ  D:  01/17/2003  T:  01/19/2003  Job:  [id number]  Glenn Guile, MD  [address]  [place]  [place] [address]  Fax: [phone]  Dr. Tresa Moore in [place]   cc:   Glenn Guile, MD [address]   [place]  [place] [address]  Fax: [phone]   Dr. Tresa Moore in [place]

## 2010-11-26 NOTE — Cardiovascular Report (Signed)
[place]. [facility]  Patient:    Glenn Roach, Glenn Roach. Visit Number: [id number] MRN: [id number]          Service Type: Attending:  Everardo Beals. Juanda Chance, M.D. [facility] Dictated by:   Everardo Beals. Juanda Chance, M.D. [facility] Proc. Date: 06/27/01   CC:         Shiv K. Harsh, M.D.  Dr. Sheria Lang  Cardiopulmonary Laboratory   Cardiac Catheterization  PROCEDURES PERFORMED: Cardiac catheterization and percutaneous coronary intervention.  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 Sylvie Farrier for recurrent chest pain. He had a DCA of the right coronary artery in 1994 and had placement of tandem overlying stents in the LAD by Dr. Riley Kill in 1996 after being treated with t-PA for an anterior infarction. In 1998, he had stenting of the proximal and mid right coronary artery for recurrent disease. He is now brought in for a repeat evaluation with angiography because of recurrent angina.  DESCRIPTION OF PROCEDURE: The procedure was performed via the right femoral artery using an arterial sheath and 6 French preformed coronary catheters. After completion of the diagnostic study and after discussing the options with the patient and his family, we made a decision to proceed with percutaneous intervention of the right coronary artery and LAD.  The patient was given weight-adjusted heparin to prolong the ACT to greater than 200 seconds and was given double bolus Integrilin and infusion. We first approached the right coronary artery and used a 7 [place] guiding catheter with side holes and a short luge wire. We crossed the lesion in the mid right coronary artery within the stent with the wire without difficulty. We used a 3.75 x 10 mm Cutting Balloon and performed four inflations up to 10 atmospheres for 38 seconds. This gave a good result in the Cutting Balloon but there appeared to be a lesion that developed distal to the stent that  appeared to be about 80%. It is not clear how this developed but we felt it should be treated and we placed a 3.5 x 8 mm Express stent just overlapping the old stent and covering the lesion then we deployed this with one inflation at 15 atmospheres for 43 seconds. Repeat studies were then performed through the catheter.  We then approached the left anterior descending artery. We used an 8 [place] guiding catheter with side holes. We tried to cross with a rotafloppy wire but were unable to do this. We were able to cross with a PT Graphix wire and then we used a transient wire to exchange for our rotafloppy wire. We then used a 1.5 bur and performed a total of 7 runs at approximately 156,000 RPMs for 10-15 seconds each. We then used a Cutting Balloon and performed multiple inflations with a 2.25 bur in the mid LAD just distal to the stent and just distal to a diagonal branch. We performed inflations up to 10 atmospheres for 64 seconds. We then exchanged for a 2.5 Cutting Balloon and performed several inflations within the stent up to 10 atmospheres for 42 seconds. We performed a final inflation distal to the stent up to 5 atmospheres for 47 seconds. Repeat diagnostic studies were then performed through the guiding catheter.  The patient tolerated the procedure well and had a great amount of back pain and chest pain and required high doses of Valium and fentanyl. Otherwise, he tolerated the procedure well and left the laboratory in satisfactory condition.  RESULTS:  The left main coronary artery: The left main coronary artery was free of significant disease.  Left anterior descending: The left anterior descending artery gave rise to a large septal perforator and two diagonal branches. There was tandem overlying stents in the proximal to mid LAD and there was 50-70% narrowing within the stent. Just distal to the stent and encompassing a moderate sized diagonal branch, there was an 80%  segmental lesion.  Circumflex artery: The circumflex artery gave rise to an intermediate branch, two small marginal branches, and a posterolateral branch. These vessels were free of significant disease.  Right coronary artery: The right coronary is a moderately large vessel that gave rise to a right ventricular branch, posterior descending branch and a posterolateral branch. There was 40% narrowing within the stent in the proximal right coronary artery. There was 90% focal stenosis within the stent in the mid right coronary artery.  LEFT VENTRICULOGRAPHY: The left ventriculogram performed in the RAO projection showed good wall motion with no areas of hypokinesis.  A subclavian injection showed no subclavian stenosis and patent internal mammary artery.  Following Cutting Balloon angioplasty of the stenosis within the stent in the mid right coronary artery and following placement of a overlapping stent for a lesion just distal to the stent, the stenosis improved from 90% to 10%.  Following rotational atherectomy and Cutting Balloon angioplasty of the mid LAD, the stenosis improved from 80% to 20% and following rotational atherectomy and Cutting Balloon angioplasty within the stent in the proximal mid LAD, the stenosis improved from 70% to 20%.  CONCLUSIONS: 1. Coronary artery disease, status post prior directional atherectomy of the    right coronary artery in 1994, status post placement of tandem overlying    stents in the left anterior descending in 1996 and status post placement    of tandem non-overlying stents in the proximal and mid right coronary artery    in 1998 with 40% narrowing in the proximal stent in the right coronary    artery, 90% in-stent re-stenosis in the stent in the mid right coronary    artery, no major obstruction in the circumflex artery, 70% stenosis within     the stent in the proximal to mid left anterior descending, and 80% stenosis    just distal to  the stent in the mid left anterior descending with normal    left ventricular function. 2. Successful Cutting Balloon angioplasty and placement of a tandem overlying    stent for in-stent re-stenosis in the mid right coronary artery with    improvement in percent diameter narrowing from 90% to 10%. 3. Successful rotational atherectomy and Cutting Balloon angioplasty of the    lesion within the stent in the proximal to mid left anterior descending    with improvement in percent diameter narrowing from 70% to 20% and    successful rotational atherectomy and Cutting Balloon angioplasty of the    mid left anterior descending lesion with improvement in percent diameter    narrowing from 80% to 20%.  DISPOSITION: The patient was returned to the postangioplasty unit for further observation. I told the patient he has approximately one chance in three of having recurrence over the next six months and would recommend that he be evaluated with a Cardiolite scan at six months or sooner if he has any recurrent symptoms. Dictated by:   Everardo Beals Juanda Chance, M.D. [facility] Attending:  Everardo Beals Juanda Chance, M.D. [facility] DD:  06/27/01 TD:  06/28/01 Job: [id number]

## 2011-07-13 DIAGNOSIS — I25810 Atherosclerosis of coronary artery bypass graft(s) without angina pectoris: Secondary | ICD-10-CM | POA: Diagnosis not present

## 2011-07-13 DIAGNOSIS — Z5189 Encounter for other specified aftercare: Secondary | ICD-10-CM | POA: Diagnosis not present

## 2011-07-19 DIAGNOSIS — Z125 Encounter for screening for malignant neoplasm of prostate: Secondary | ICD-10-CM | POA: Diagnosis not present

## 2011-07-19 DIAGNOSIS — E1129 Type 2 diabetes mellitus with other diabetic kidney complication: Secondary | ICD-10-CM | POA: Diagnosis not present

## 2011-08-17 DIAGNOSIS — I25810 Atherosclerosis of coronary artery bypass graft(s) without angina pectoris: Secondary | ICD-10-CM | POA: Diagnosis not present

## 2011-08-17 DIAGNOSIS — E119 Type 2 diabetes mellitus without complications: Secondary | ICD-10-CM | POA: Diagnosis not present

## 2011-08-17 DIAGNOSIS — Z951 Presence of aortocoronary bypass graft: Secondary | ICD-10-CM | POA: Diagnosis not present

## 2011-08-17 DIAGNOSIS — E785 Hyperlipidemia, unspecified: Secondary | ICD-10-CM | POA: Diagnosis not present

## 2011-08-17 DIAGNOSIS — I2510 Atherosclerotic heart disease of native coronary artery without angina pectoris: Secondary | ICD-10-CM | POA: Diagnosis not present

## 2011-08-17 DIAGNOSIS — Z5189 Encounter for other specified aftercare: Secondary | ICD-10-CM | POA: Diagnosis not present

## 2011-08-24 DIAGNOSIS — R197 Diarrhea, unspecified: Secondary | ICD-10-CM | POA: Diagnosis not present

## 2011-09-13 DIAGNOSIS — I25810 Atherosclerosis of coronary artery bypass graft(s) without angina pectoris: Secondary | ICD-10-CM | POA: Diagnosis not present

## 2011-09-13 DIAGNOSIS — Z5189 Encounter for other specified aftercare: Secondary | ICD-10-CM | POA: Diagnosis not present

## 2011-09-23 DIAGNOSIS — R197 Diarrhea, unspecified: Secondary | ICD-10-CM | POA: Diagnosis not present

## 2011-09-23 DIAGNOSIS — E119 Type 2 diabetes mellitus without complications: Secondary | ICD-10-CM | POA: Diagnosis not present

## 2011-09-23 DIAGNOSIS — E1129 Type 2 diabetes mellitus with other diabetic kidney complication: Secondary | ICD-10-CM | POA: Diagnosis not present

## 2011-10-07 DIAGNOSIS — I2510 Atherosclerotic heart disease of native coronary artery without angina pectoris: Secondary | ICD-10-CM | POA: Diagnosis not present

## 2011-10-07 DIAGNOSIS — R072 Precordial pain: Secondary | ICD-10-CM | POA: Diagnosis not present

## 2011-10-07 DIAGNOSIS — E119 Type 2 diabetes mellitus without complications: Secondary | ICD-10-CM | POA: Diagnosis not present

## 2011-10-07 DIAGNOSIS — I10 Essential (primary) hypertension: Secondary | ICD-10-CM | POA: Diagnosis not present

## 2011-10-07 DIAGNOSIS — R9431 Abnormal electrocardiogram [ECG] [EKG]: Secondary | ICD-10-CM | POA: Diagnosis not present

## 2011-10-07 DIAGNOSIS — Z951 Presence of aortocoronary bypass graft: Secondary | ICD-10-CM | POA: Diagnosis not present

## 2011-10-07 DIAGNOSIS — R0789 Other chest pain: Secondary | ICD-10-CM | POA: Diagnosis not present

## 2011-10-07 DIAGNOSIS — E785 Hyperlipidemia, unspecified: Secondary | ICD-10-CM | POA: Diagnosis not present

## 2011-10-10 DIAGNOSIS — Z5189 Encounter for other specified aftercare: Secondary | ICD-10-CM | POA: Diagnosis not present

## 2011-10-10 DIAGNOSIS — I25810 Atherosclerosis of coronary artery bypass graft(s) without angina pectoris: Secondary | ICD-10-CM | POA: Diagnosis not present

## 2011-10-16 IMAGING — CR DG CHEST 2V
2 series · 2 of 2 positions shown · non-contrast
Comparison: Portable chest x-rays 09/12/2010 dating back to
09/09/2010.

CLINICAL DATA: Postop CABG.  Follow-up effusions.

CHEST - 2 VIEW 09/14/2010:

[w chest pa]
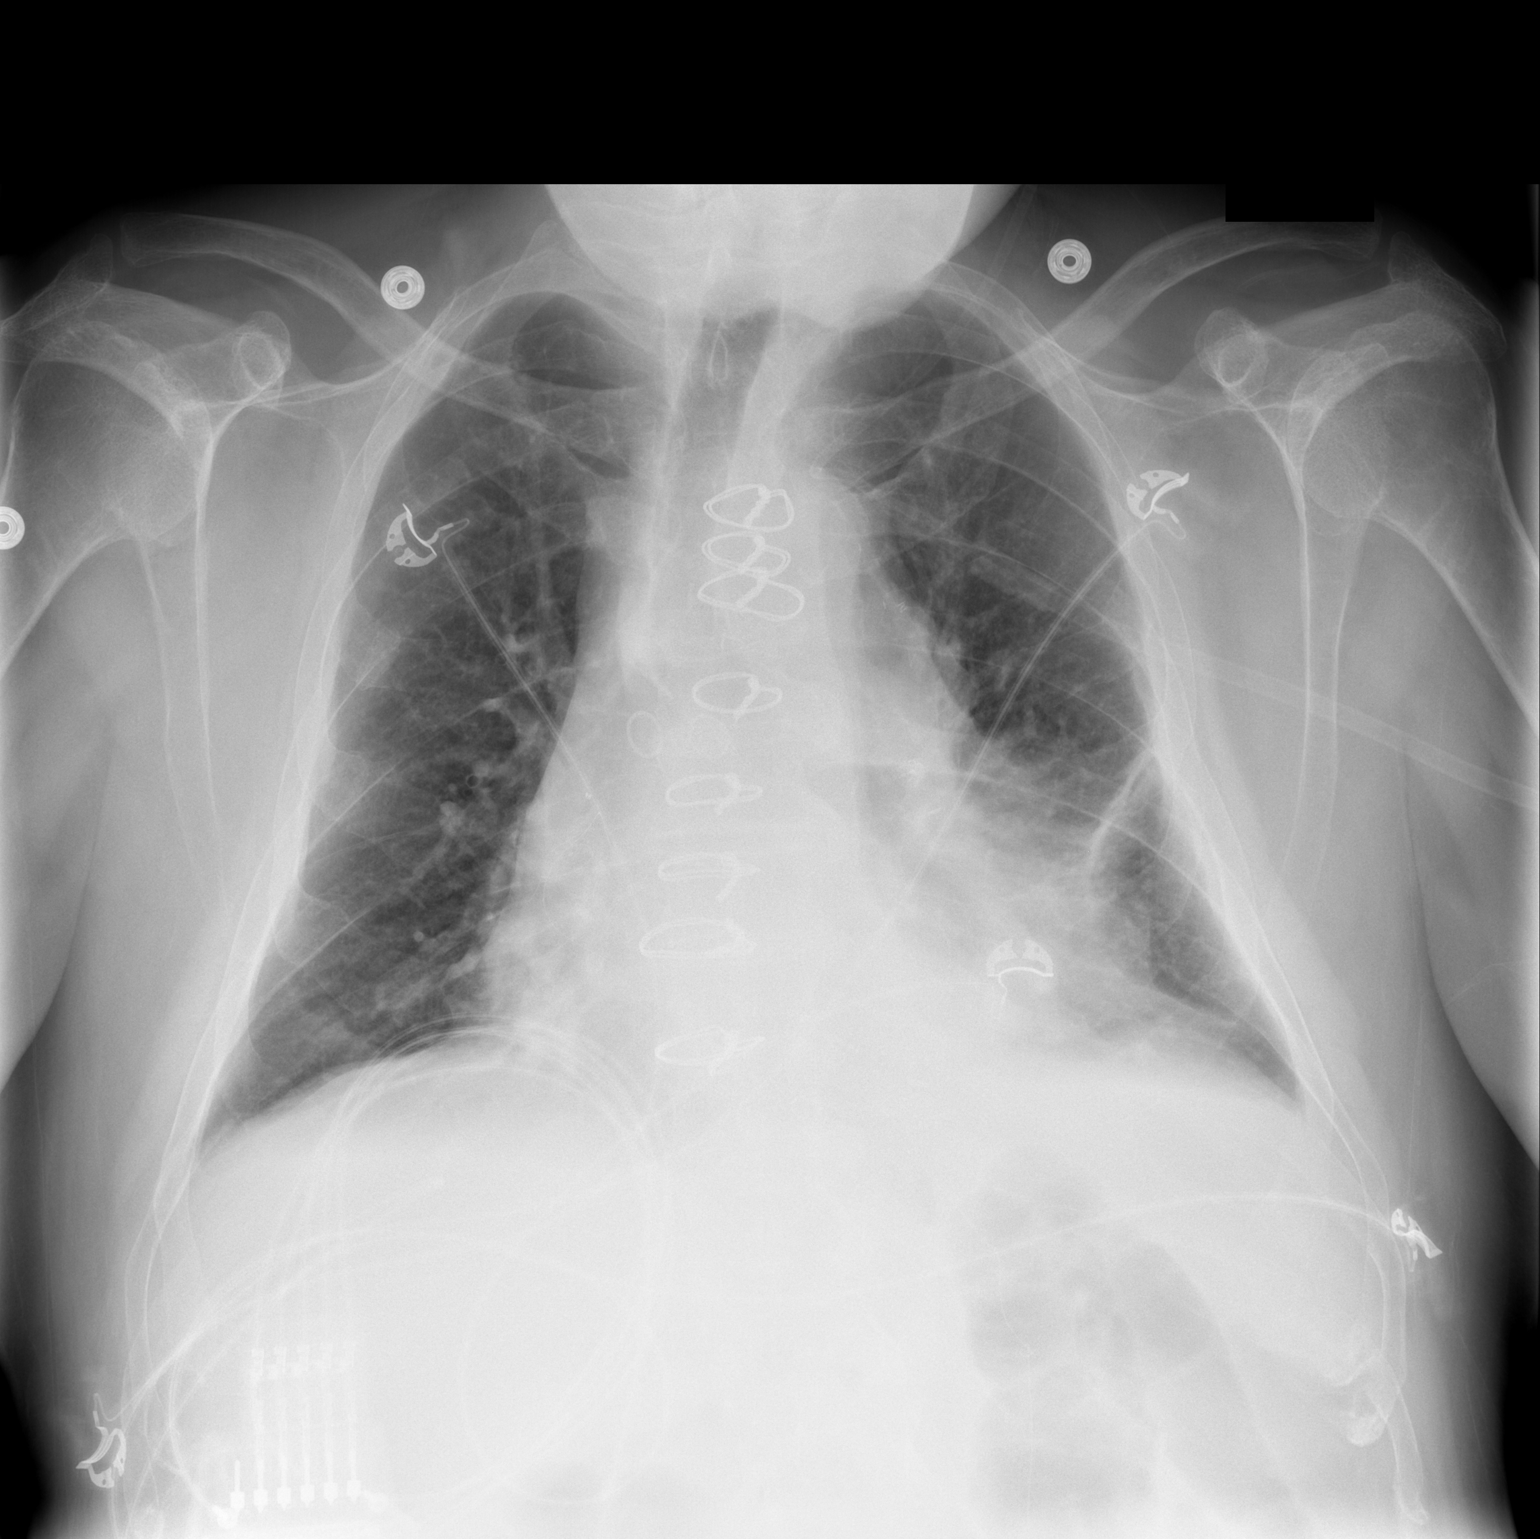

[w chest lat]
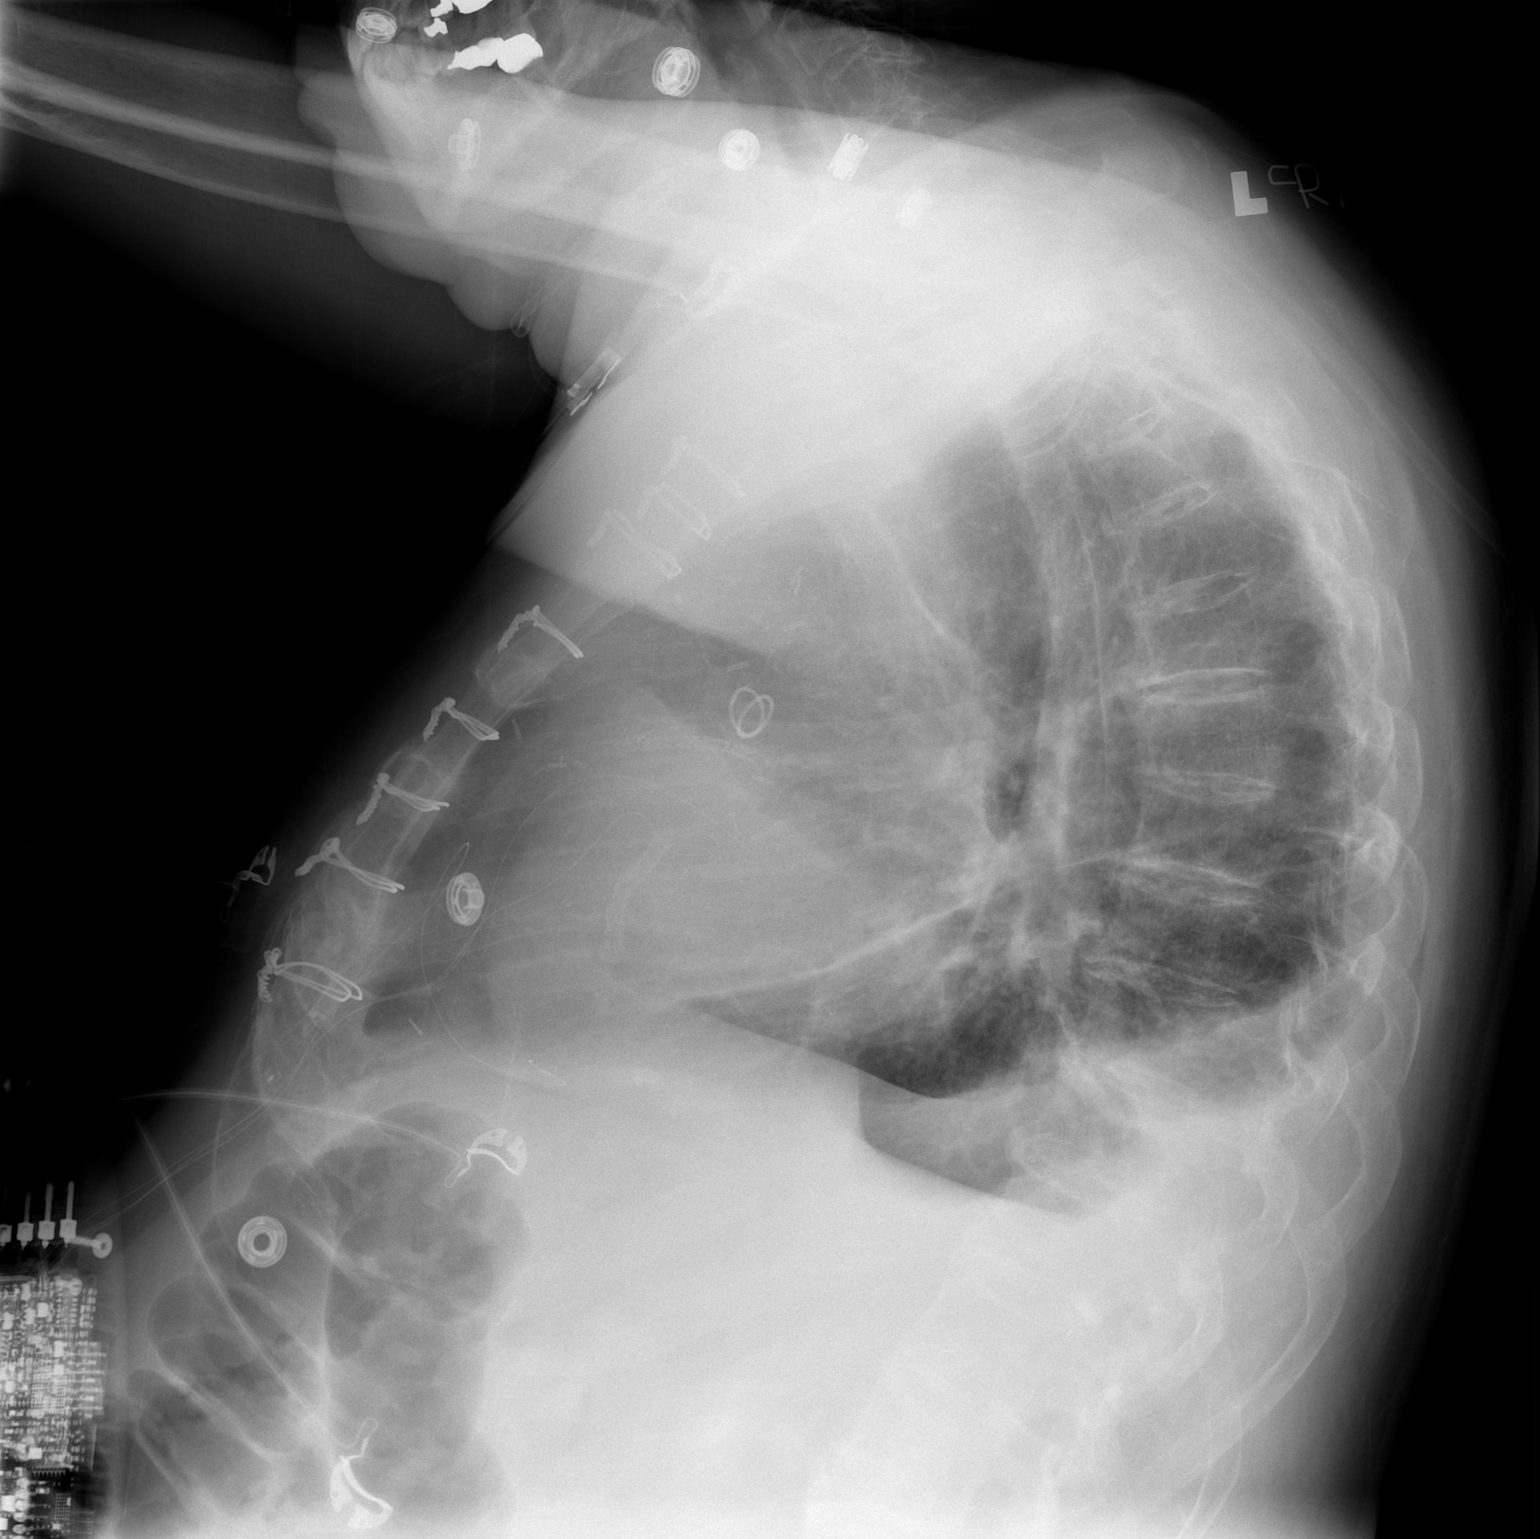

[2 of 2 positions shown; findings below may reference images not displayed]

FINDINGS: Sternotomy for CABG.  Cardiac silhouette moderately
enlarged.  Mild pulmonary venous hypertension without overt edema.
Linear atelectasis in the lingula.  Moderate bilateral pleural
effusions, unchanged, and associated mild passive atelectasis in
the lower lobes.  No new pulmonary parenchymal abnormalities.  Mild
degenerative changes involving the thoracic spine.
IMPRESSION: Stable cardiomegaly.  Mild pulmonary venous hypertension without
overt edema.  Linear atelectasis in the lingula.  Stable moderate
sized bilateral pleural effusions and associated mild passive
atelectasis in the lower lobes.  No new abnormalities.

## 2011-11-01 DIAGNOSIS — E782 Mixed hyperlipidemia: Secondary | ICD-10-CM | POA: Diagnosis not present

## 2011-11-01 DIAGNOSIS — E1129 Type 2 diabetes mellitus with other diabetic kidney complication: Secondary | ICD-10-CM | POA: Diagnosis not present

## 2011-11-01 DIAGNOSIS — E119 Type 2 diabetes mellitus without complications: Secondary | ICD-10-CM | POA: Diagnosis not present

## 2011-11-15 DIAGNOSIS — Z5189 Encounter for other specified aftercare: Secondary | ICD-10-CM | POA: Diagnosis not present

## 2011-11-15 DIAGNOSIS — I25810 Atherosclerosis of coronary artery bypass graft(s) without angina pectoris: Secondary | ICD-10-CM | POA: Diagnosis not present

## 2011-11-24 DIAGNOSIS — E785 Hyperlipidemia, unspecified: Secondary | ICD-10-CM | POA: Diagnosis not present

## 2011-11-24 DIAGNOSIS — I10 Essential (primary) hypertension: Secondary | ICD-10-CM | POA: Diagnosis not present

## 2011-11-24 DIAGNOSIS — I2510 Atherosclerotic heart disease of native coronary artery without angina pectoris: Secondary | ICD-10-CM | POA: Diagnosis not present

## 2011-11-24 DIAGNOSIS — E119 Type 2 diabetes mellitus without complications: Secondary | ICD-10-CM | POA: Diagnosis not present

## 2011-12-01 DIAGNOSIS — I2510 Atherosclerotic heart disease of native coronary artery without angina pectoris: Secondary | ICD-10-CM | POA: Diagnosis not present

## 2011-12-01 DIAGNOSIS — E785 Hyperlipidemia, unspecified: Secondary | ICD-10-CM | POA: Diagnosis not present

## 2011-12-12 DIAGNOSIS — Z5189 Encounter for other specified aftercare: Secondary | ICD-10-CM | POA: Diagnosis not present

## 2011-12-12 DIAGNOSIS — I25810 Atherosclerosis of coronary artery bypass graft(s) without angina pectoris: Secondary | ICD-10-CM | POA: Diagnosis not present

## 2012-02-02 DIAGNOSIS — Z713 Dietary counseling and surveillance: Secondary | ICD-10-CM | POA: Diagnosis not present

## 2012-02-02 DIAGNOSIS — E119 Type 2 diabetes mellitus without complications: Secondary | ICD-10-CM | POA: Diagnosis not present

## 2012-05-30 DIAGNOSIS — E11329 Type 2 diabetes mellitus with mild nonproliferative diabetic retinopathy without macular edema: Secondary | ICD-10-CM | POA: Diagnosis not present

## 2012-06-05 DIAGNOSIS — E119 Type 2 diabetes mellitus without complications: Secondary | ICD-10-CM | POA: Diagnosis not present

## 2012-06-05 DIAGNOSIS — E669 Obesity, unspecified: Secondary | ICD-10-CM | POA: Diagnosis not present

## 2012-06-05 DIAGNOSIS — E785 Hyperlipidemia, unspecified: Secondary | ICD-10-CM | POA: Diagnosis not present

## 2012-06-05 DIAGNOSIS — Z79899 Other long term (current) drug therapy: Secondary | ICD-10-CM | POA: Diagnosis not present

## 2012-06-05 DIAGNOSIS — I10 Essential (primary) hypertension: Secondary | ICD-10-CM | POA: Diagnosis not present

## 2012-06-05 DIAGNOSIS — I2510 Atherosclerotic heart disease of native coronary artery without angina pectoris: Secondary | ICD-10-CM | POA: Diagnosis not present

## 2012-06-12 DIAGNOSIS — E782 Mixed hyperlipidemia: Secondary | ICD-10-CM | POA: Diagnosis not present

## 2012-06-12 DIAGNOSIS — E669 Obesity, unspecified: Secondary | ICD-10-CM | POA: Diagnosis not present

## 2012-06-12 DIAGNOSIS — E119 Type 2 diabetes mellitus without complications: Secondary | ICD-10-CM | POA: Diagnosis not present

## 2012-06-12 DIAGNOSIS — Z23 Encounter for immunization: Secondary | ICD-10-CM | POA: Diagnosis not present

## 2012-06-12 DIAGNOSIS — E1129 Type 2 diabetes mellitus with other diabetic kidney complication: Secondary | ICD-10-CM | POA: Diagnosis not present

## 2012-10-19 DIAGNOSIS — I10 Essential (primary) hypertension: Secondary | ICD-10-CM | POA: Diagnosis not present

## 2012-10-19 DIAGNOSIS — M4802 Spinal stenosis, cervical region: Secondary | ICD-10-CM | POA: Diagnosis not present

## 2012-10-19 DIAGNOSIS — M5412 Radiculopathy, cervical region: Secondary | ICD-10-CM | POA: Diagnosis not present

## 2012-10-19 DIAGNOSIS — R5381 Other malaise: Secondary | ICD-10-CM | POA: Diagnosis not present

## 2012-10-19 DIAGNOSIS — R209 Unspecified disturbances of skin sensation: Secondary | ICD-10-CM | POA: Diagnosis not present

## 2012-10-19 DIAGNOSIS — M542 Cervicalgia: Secondary | ICD-10-CM | POA: Diagnosis not present

## 2012-10-19 DIAGNOSIS — E119 Type 2 diabetes mellitus without complications: Secondary | ICD-10-CM | POA: Diagnosis not present

## 2012-10-22 DIAGNOSIS — E1129 Type 2 diabetes mellitus with other diabetic kidney complication: Secondary | ICD-10-CM | POA: Diagnosis not present

## 2012-10-25 DIAGNOSIS — Z0000 Encounter for general adult medical examination without abnormal findings: Secondary | ICD-10-CM | POA: Diagnosis not present

## 2012-10-25 DIAGNOSIS — M542 Cervicalgia: Secondary | ICD-10-CM | POA: Diagnosis not present

## 2012-10-25 DIAGNOSIS — E119 Type 2 diabetes mellitus without complications: Secondary | ICD-10-CM | POA: Diagnosis not present

## 2012-11-29 DIAGNOSIS — E785 Hyperlipidemia, unspecified: Secondary | ICD-10-CM | POA: Diagnosis not present

## 2012-11-29 DIAGNOSIS — E119 Type 2 diabetes mellitus without complications: Secondary | ICD-10-CM | POA: Diagnosis not present

## 2012-11-29 DIAGNOSIS — I2510 Atherosclerotic heart disease of native coronary artery without angina pectoris: Secondary | ICD-10-CM | POA: Diagnosis not present

## 2012-11-29 DIAGNOSIS — I10 Essential (primary) hypertension: Secondary | ICD-10-CM | POA: Diagnosis not present

## 2012-11-29 DIAGNOSIS — Z79899 Other long term (current) drug therapy: Secondary | ICD-10-CM | POA: Diagnosis not present

## 2012-12-06 DIAGNOSIS — E119 Type 2 diabetes mellitus without complications: Secondary | ICD-10-CM | POA: Diagnosis not present

## 2012-12-06 DIAGNOSIS — E1129 Type 2 diabetes mellitus with other diabetic kidney complication: Secondary | ICD-10-CM | POA: Diagnosis not present

## 2012-12-06 DIAGNOSIS — N058 Unspecified nephritic syndrome with other morphologic changes: Secondary | ICD-10-CM | POA: Diagnosis not present

## 2012-12-07 DIAGNOSIS — Z7902 Long term (current) use of antithrombotics/antiplatelets: Secondary | ICD-10-CM | POA: Diagnosis not present

## 2012-12-07 DIAGNOSIS — E119 Type 2 diabetes mellitus without complications: Secondary | ICD-10-CM | POA: Diagnosis not present

## 2012-12-07 DIAGNOSIS — I2510 Atherosclerotic heart disease of native coronary artery without angina pectoris: Secondary | ICD-10-CM | POA: Diagnosis not present

## 2012-12-07 DIAGNOSIS — Z006 Encounter for examination for normal comparison and control in clinical research program: Secondary | ICD-10-CM | POA: Diagnosis not present

## 2012-12-25 DIAGNOSIS — E119 Type 2 diabetes mellitus without complications: Secondary | ICD-10-CM | POA: Diagnosis not present

## 2012-12-25 DIAGNOSIS — E785 Hyperlipidemia, unspecified: Secondary | ICD-10-CM | POA: Diagnosis not present

## 2012-12-25 DIAGNOSIS — E669 Obesity, unspecified: Secondary | ICD-10-CM | POA: Diagnosis not present

## 2012-12-25 DIAGNOSIS — I2510 Atherosclerotic heart disease of native coronary artery without angina pectoris: Secondary | ICD-10-CM | POA: Diagnosis not present

## 2012-12-25 DIAGNOSIS — I10 Essential (primary) hypertension: Secondary | ICD-10-CM | POA: Diagnosis not present

## 2013-02-05 DIAGNOSIS — N058 Unspecified nephritic syndrome with other morphologic changes: Secondary | ICD-10-CM | POA: Diagnosis not present

## 2013-02-05 DIAGNOSIS — Z79899 Other long term (current) drug therapy: Secondary | ICD-10-CM | POA: Diagnosis not present

## 2013-02-05 DIAGNOSIS — Z125 Encounter for screening for malignant neoplasm of prostate: Secondary | ICD-10-CM | POA: Diagnosis not present

## 2013-02-05 DIAGNOSIS — E119 Type 2 diabetes mellitus without complications: Secondary | ICD-10-CM | POA: Diagnosis not present

## 2013-02-05 DIAGNOSIS — E1129 Type 2 diabetes mellitus with other diabetic kidney complication: Secondary | ICD-10-CM | POA: Diagnosis not present

## 2013-06-04 DIAGNOSIS — E1139 Type 2 diabetes mellitus with other diabetic ophthalmic complication: Secondary | ICD-10-CM | POA: Diagnosis not present

## 2013-06-04 DIAGNOSIS — E11329 Type 2 diabetes mellitus with mild nonproliferative diabetic retinopathy without macular edema: Secondary | ICD-10-CM | POA: Diagnosis not present

## 2013-06-11 DIAGNOSIS — Z23 Encounter for immunization: Secondary | ICD-10-CM | POA: Diagnosis not present

## 2013-06-27 DIAGNOSIS — I2510 Atherosclerotic heart disease of native coronary artery without angina pectoris: Secondary | ICD-10-CM | POA: Diagnosis not present

## 2013-06-27 DIAGNOSIS — I10 Essential (primary) hypertension: Secondary | ICD-10-CM | POA: Diagnosis not present

## 2013-06-27 DIAGNOSIS — E785 Hyperlipidemia, unspecified: Secondary | ICD-10-CM | POA: Diagnosis not present

## 2013-06-27 DIAGNOSIS — E669 Obesity, unspecified: Secondary | ICD-10-CM | POA: Diagnosis not present

## 2013-06-27 DIAGNOSIS — E119 Type 2 diabetes mellitus without complications: Secondary | ICD-10-CM | POA: Diagnosis not present

## 2013-07-26 DIAGNOSIS — I4949 Other premature depolarization: Secondary | ICD-10-CM | POA: Diagnosis not present

## 2013-07-26 DIAGNOSIS — E785 Hyperlipidemia, unspecified: Secondary | ICD-10-CM | POA: Diagnosis not present

## 2013-07-26 DIAGNOSIS — E119 Type 2 diabetes mellitus without complications: Secondary | ICD-10-CM | POA: Diagnosis not present

## 2013-07-26 DIAGNOSIS — D649 Anemia, unspecified: Secondary | ICD-10-CM | POA: Diagnosis not present

## 2013-07-26 DIAGNOSIS — I10 Essential (primary) hypertension: Secondary | ICD-10-CM | POA: Diagnosis not present

## 2013-07-26 DIAGNOSIS — R42 Dizziness and giddiness: Secondary | ICD-10-CM | POA: Diagnosis not present

## 2013-07-26 DIAGNOSIS — Z79899 Other long term (current) drug therapy: Secondary | ICD-10-CM | POA: Diagnosis not present

## 2013-07-26 DIAGNOSIS — Z0000 Encounter for general adult medical examination without abnormal findings: Secondary | ICD-10-CM | POA: Diagnosis not present

## 2013-07-26 DIAGNOSIS — I2510 Atherosclerotic heart disease of native coronary artery without angina pectoris: Secondary | ICD-10-CM | POA: Diagnosis not present

## 2013-08-08 DIAGNOSIS — I2510 Atherosclerotic heart disease of native coronary artery without angina pectoris: Secondary | ICD-10-CM | POA: Diagnosis not present

## 2013-08-15 DIAGNOSIS — I4949 Other premature depolarization: Secondary | ICD-10-CM | POA: Diagnosis not present

## 2013-08-21 DIAGNOSIS — I4949 Other premature depolarization: Secondary | ICD-10-CM | POA: Diagnosis not present

## 2013-09-04 DIAGNOSIS — Z23 Encounter for immunization: Secondary | ICD-10-CM | POA: Diagnosis not present

## 2013-09-11 DIAGNOSIS — E119 Type 2 diabetes mellitus without complications: Secondary | ICD-10-CM | POA: Diagnosis not present

## 2013-09-11 DIAGNOSIS — E669 Obesity, unspecified: Secondary | ICD-10-CM | POA: Diagnosis not present

## 2013-09-11 DIAGNOSIS — E785 Hyperlipidemia, unspecified: Secondary | ICD-10-CM | POA: Diagnosis not present

## 2013-09-11 DIAGNOSIS — Z79899 Other long term (current) drug therapy: Secondary | ICD-10-CM | POA: Diagnosis not present

## 2013-09-11 DIAGNOSIS — I10 Essential (primary) hypertension: Secondary | ICD-10-CM | POA: Diagnosis not present

## 2013-09-11 DIAGNOSIS — I2510 Atherosclerotic heart disease of native coronary artery without angina pectoris: Secondary | ICD-10-CM | POA: Diagnosis not present

## 2013-09-13 DIAGNOSIS — E1129 Type 2 diabetes mellitus with other diabetic kidney complication: Secondary | ICD-10-CM | POA: Diagnosis not present

## 2013-09-13 DIAGNOSIS — E782 Mixed hyperlipidemia: Secondary | ICD-10-CM | POA: Diagnosis not present

## 2013-09-13 DIAGNOSIS — N183 Chronic kidney disease, stage 3 unspecified: Secondary | ICD-10-CM | POA: Diagnosis not present

## 2013-09-13 DIAGNOSIS — IMO0001 Reserved for inherently not codable concepts without codable children: Secondary | ICD-10-CM | POA: Diagnosis not present

## 2013-12-05 DIAGNOSIS — IMO0001 Reserved for inherently not codable concepts without codable children: Secondary | ICD-10-CM | POA: Diagnosis not present

## 2013-12-27 DIAGNOSIS — E782 Mixed hyperlipidemia: Secondary | ICD-10-CM | POA: Diagnosis not present

## 2013-12-27 DIAGNOSIS — N183 Chronic kidney disease, stage 3 unspecified: Secondary | ICD-10-CM | POA: Diagnosis not present

## 2013-12-27 DIAGNOSIS — I10 Essential (primary) hypertension: Secondary | ICD-10-CM | POA: Diagnosis not present

## 2013-12-27 DIAGNOSIS — IMO0001 Reserved for inherently not codable concepts without codable children: Secondary | ICD-10-CM | POA: Diagnosis not present

## 2014-04-18 DIAGNOSIS — E785 Hyperlipidemia, unspecified: Secondary | ICD-10-CM | POA: Diagnosis not present

## 2014-04-18 DIAGNOSIS — I10 Essential (primary) hypertension: Secondary | ICD-10-CM | POA: Diagnosis not present

## 2014-04-18 DIAGNOSIS — I2510 Atherosclerotic heart disease of native coronary artery without angina pectoris: Secondary | ICD-10-CM | POA: Diagnosis not present

## 2014-04-18 DIAGNOSIS — E119 Type 2 diabetes mellitus without complications: Secondary | ICD-10-CM | POA: Diagnosis not present

## 2014-04-18 DIAGNOSIS — E669 Obesity, unspecified: Secondary | ICD-10-CM | POA: Diagnosis not present

## 2014-06-10 DIAGNOSIS — Z23 Encounter for immunization: Secondary | ICD-10-CM | POA: Diagnosis not present

## 2014-06-10 DIAGNOSIS — H524 Presbyopia: Secondary | ICD-10-CM | POA: Diagnosis not present

## 2014-06-10 DIAGNOSIS — Z0000 Encounter for general adult medical examination without abnormal findings: Secondary | ICD-10-CM | POA: Diagnosis not present

## 2014-06-10 DIAGNOSIS — Z125 Encounter for screening for malignant neoplasm of prostate: Secondary | ICD-10-CM | POA: Diagnosis not present

## 2014-06-10 DIAGNOSIS — E1165 Type 2 diabetes mellitus with hyperglycemia: Secondary | ICD-10-CM | POA: Diagnosis not present

## 2014-06-10 DIAGNOSIS — I10 Essential (primary) hypertension: Secondary | ICD-10-CM | POA: Diagnosis not present

## 2014-06-10 DIAGNOSIS — E119 Type 2 diabetes mellitus without complications: Secondary | ICD-10-CM | POA: Diagnosis not present

## 2014-06-10 DIAGNOSIS — E1129 Type 2 diabetes mellitus with other diabetic kidney complication: Secondary | ICD-10-CM | POA: Diagnosis not present

## 2015-02-20 DIAGNOSIS — E1165 Type 2 diabetes mellitus with hyperglycemia: Secondary | ICD-10-CM | POA: Diagnosis not present

## 2015-02-24 DIAGNOSIS — E0865 Diabetes mellitus due to underlying condition with hyperglycemia: Secondary | ICD-10-CM | POA: Diagnosis not present

## 2015-02-24 DIAGNOSIS — E1122 Type 2 diabetes mellitus with diabetic chronic kidney disease: Secondary | ICD-10-CM | POA: Diagnosis not present

## 2015-02-24 DIAGNOSIS — N183 Chronic kidney disease, stage 3 (moderate): Secondary | ICD-10-CM | POA: Diagnosis not present

## 2015-02-24 DIAGNOSIS — I259 Chronic ischemic heart disease, unspecified: Secondary | ICD-10-CM | POA: Diagnosis not present

## 2015-04-14 DIAGNOSIS — E6609 Other obesity due to excess calories: Secondary | ICD-10-CM

## 2015-04-14 DIAGNOSIS — I2510 Atherosclerotic heart disease of native coronary artery without angina pectoris: Secondary | ICD-10-CM | POA: Diagnosis not present

## 2015-04-14 DIAGNOSIS — N289 Disorder of kidney and ureter, unspecified: Secondary | ICD-10-CM | POA: Insufficient documentation

## 2015-04-14 DIAGNOSIS — E088 Diabetes mellitus due to underlying condition with unspecified complications: Secondary | ICD-10-CM | POA: Diagnosis not present

## 2015-04-14 DIAGNOSIS — I10 Essential (primary) hypertension: Secondary | ICD-10-CM | POA: Insufficient documentation

## 2015-04-14 DIAGNOSIS — E785 Hyperlipidemia, unspecified: Secondary | ICD-10-CM | POA: Diagnosis not present

## 2015-04-14 HISTORY — DX: Other obesity due to excess calories: E66.09

## 2015-04-14 HISTORY — DX: Disorder of kidney and ureter, unspecified: N28.9

## 2015-04-14 HISTORY — DX: Essential (primary) hypertension: I10

## 2015-06-18 DIAGNOSIS — E1165 Type 2 diabetes mellitus with hyperglycemia: Secondary | ICD-10-CM | POA: Diagnosis not present

## 2015-06-18 DIAGNOSIS — Z79899 Other long term (current) drug therapy: Secondary | ICD-10-CM | POA: Diagnosis not present

## 2015-06-18 DIAGNOSIS — E1169 Type 2 diabetes mellitus with other specified complication: Secondary | ICD-10-CM | POA: Diagnosis not present

## 2015-06-18 DIAGNOSIS — N183 Chronic kidney disease, stage 3 (moderate): Secondary | ICD-10-CM | POA: Diagnosis not present

## 2015-06-18 DIAGNOSIS — E1122 Type 2 diabetes mellitus with diabetic chronic kidney disease: Secondary | ICD-10-CM | POA: Diagnosis not present

## 2015-06-18 DIAGNOSIS — E785 Hyperlipidemia, unspecified: Secondary | ICD-10-CM | POA: Diagnosis not present

## 2015-06-18 DIAGNOSIS — Z0000 Encounter for general adult medical examination without abnormal findings: Secondary | ICD-10-CM | POA: Diagnosis not present

## 2015-06-18 DIAGNOSIS — E0865 Diabetes mellitus due to underlying condition with hyperglycemia: Secondary | ICD-10-CM | POA: Diagnosis not present

## 2015-06-18 DIAGNOSIS — I10 Essential (primary) hypertension: Secondary | ICD-10-CM | POA: Diagnosis not present

## 2015-06-18 DIAGNOSIS — Z23 Encounter for immunization: Secondary | ICD-10-CM | POA: Diagnosis not present

## 2015-07-15 DIAGNOSIS — E119 Type 2 diabetes mellitus without complications: Secondary | ICD-10-CM | POA: Diagnosis not present

## 2015-09-15 DIAGNOSIS — E785 Hyperlipidemia, unspecified: Secondary | ICD-10-CM | POA: Diagnosis not present

## 2015-09-15 DIAGNOSIS — I2510 Atherosclerotic heart disease of native coronary artery without angina pectoris: Secondary | ICD-10-CM | POA: Diagnosis not present

## 2015-09-15 DIAGNOSIS — E139 Other specified diabetes mellitus without complications: Secondary | ICD-10-CM | POA: Diagnosis not present

## 2015-09-15 DIAGNOSIS — E088 Diabetes mellitus due to underlying condition with unspecified complications: Secondary | ICD-10-CM | POA: Diagnosis not present

## 2015-12-10 DIAGNOSIS — I10 Essential (primary) hypertension: Secondary | ICD-10-CM | POA: Diagnosis not present

## 2015-12-10 DIAGNOSIS — N183 Chronic kidney disease, stage 3 (moderate): Secondary | ICD-10-CM | POA: Diagnosis not present

## 2015-12-10 DIAGNOSIS — E782 Mixed hyperlipidemia: Secondary | ICD-10-CM | POA: Diagnosis not present

## 2015-12-10 DIAGNOSIS — E1165 Type 2 diabetes mellitus with hyperglycemia: Secondary | ICD-10-CM | POA: Diagnosis not present

## 2015-12-10 DIAGNOSIS — Z79899 Other long term (current) drug therapy: Secondary | ICD-10-CM | POA: Diagnosis not present

## 2016-02-09 DIAGNOSIS — I10 Essential (primary) hypertension: Secondary | ICD-10-CM | POA: Diagnosis not present

## 2016-02-09 DIAGNOSIS — E785 Hyperlipidemia, unspecified: Secondary | ICD-10-CM | POA: Diagnosis not present

## 2016-02-09 DIAGNOSIS — E6609 Other obesity due to excess calories: Secondary | ICD-10-CM | POA: Diagnosis not present

## 2016-02-09 DIAGNOSIS — E088 Diabetes mellitus due to underlying condition with unspecified complications: Secondary | ICD-10-CM | POA: Diagnosis not present

## 2016-02-09 DIAGNOSIS — N289 Disorder of kidney and ureter, unspecified: Secondary | ICD-10-CM | POA: Diagnosis not present

## 2016-02-09 DIAGNOSIS — I2510 Atherosclerotic heart disease of native coronary artery without angina pectoris: Secondary | ICD-10-CM | POA: Diagnosis not present

## 2016-06-22 DIAGNOSIS — Z0000 Encounter for general adult medical examination without abnormal findings: Secondary | ICD-10-CM | POA: Diagnosis not present

## 2016-06-22 DIAGNOSIS — Z23 Encounter for immunization: Secondary | ICD-10-CM | POA: Diagnosis not present

## 2016-06-22 DIAGNOSIS — N183 Chronic kidney disease, stage 3 (moderate): Secondary | ICD-10-CM | POA: Diagnosis not present

## 2016-06-22 DIAGNOSIS — E782 Mixed hyperlipidemia: Secondary | ICD-10-CM | POA: Diagnosis not present

## 2016-06-22 DIAGNOSIS — Z79899 Other long term (current) drug therapy: Secondary | ICD-10-CM | POA: Diagnosis not present

## 2016-06-22 DIAGNOSIS — E1165 Type 2 diabetes mellitus with hyperglycemia: Secondary | ICD-10-CM | POA: Diagnosis not present

## 2016-07-18 DIAGNOSIS — H2513 Age-related nuclear cataract, bilateral: Secondary | ICD-10-CM | POA: Diagnosis not present

## 2016-07-18 DIAGNOSIS — H119 Unspecified disorder of conjunctiva: Secondary | ICD-10-CM | POA: Diagnosis not present

## 2016-07-18 DIAGNOSIS — H524 Presbyopia: Secondary | ICD-10-CM | POA: Diagnosis not present

## 2016-07-25 DIAGNOSIS — G4733 Obstructive sleep apnea (adult) (pediatric): Secondary | ICD-10-CM | POA: Diagnosis not present

## 2016-08-16 DIAGNOSIS — I10 Essential (primary) hypertension: Secondary | ICD-10-CM | POA: Diagnosis not present

## 2016-08-16 DIAGNOSIS — I2510 Atherosclerotic heart disease of native coronary artery without angina pectoris: Secondary | ICD-10-CM | POA: Diagnosis not present

## 2016-08-16 DIAGNOSIS — E088 Diabetes mellitus due to underlying condition with unspecified complications: Secondary | ICD-10-CM | POA: Diagnosis not present

## 2016-08-16 DIAGNOSIS — E6609 Other obesity due to excess calories: Secondary | ICD-10-CM | POA: Diagnosis not present

## 2016-08-16 DIAGNOSIS — N289 Disorder of kidney and ureter, unspecified: Secondary | ICD-10-CM | POA: Diagnosis not present

## 2016-08-16 DIAGNOSIS — E785 Hyperlipidemia, unspecified: Secondary | ICD-10-CM | POA: Diagnosis not present

## 2016-11-14 DIAGNOSIS — Z125 Encounter for screening for malignant neoplasm of prostate: Secondary | ICD-10-CM | POA: Diagnosis not present

## 2016-11-14 DIAGNOSIS — E1165 Type 2 diabetes mellitus with hyperglycemia: Secondary | ICD-10-CM | POA: Diagnosis not present

## 2016-11-14 DIAGNOSIS — G4733 Obstructive sleep apnea (adult) (pediatric): Secondary | ICD-10-CM | POA: Diagnosis not present

## 2016-11-14 DIAGNOSIS — N183 Chronic kidney disease, stage 3 (moderate): Secondary | ICD-10-CM | POA: Diagnosis not present

## 2016-11-14 DIAGNOSIS — E782 Mixed hyperlipidemia: Secondary | ICD-10-CM | POA: Diagnosis not present

## 2016-11-14 DIAGNOSIS — R3911 Hesitancy of micturition: Secondary | ICD-10-CM | POA: Diagnosis not present

## 2016-12-29 DIAGNOSIS — Z7984 Long term (current) use of oral hypoglycemic drugs: Secondary | ICD-10-CM | POA: Diagnosis not present

## 2016-12-29 DIAGNOSIS — I252 Old myocardial infarction: Secondary | ICD-10-CM | POA: Diagnosis not present

## 2016-12-29 DIAGNOSIS — Z79899 Other long term (current) drug therapy: Secondary | ICD-10-CM | POA: Diagnosis not present

## 2016-12-29 DIAGNOSIS — I2510 Atherosclerotic heart disease of native coronary artery without angina pectoris: Secondary | ICD-10-CM | POA: Diagnosis not present

## 2016-12-29 DIAGNOSIS — E785 Hyperlipidemia, unspecified: Secondary | ICD-10-CM | POA: Diagnosis not present

## 2016-12-29 DIAGNOSIS — E119 Type 2 diabetes mellitus without complications: Secondary | ICD-10-CM | POA: Diagnosis not present

## 2016-12-29 DIAGNOSIS — I10 Essential (primary) hypertension: Secondary | ICD-10-CM | POA: Diagnosis not present

## 2016-12-29 DIAGNOSIS — R0602 Shortness of breath: Secondary | ICD-10-CM | POA: Diagnosis not present

## 2016-12-29 DIAGNOSIS — Z951 Presence of aortocoronary bypass graft: Secondary | ICD-10-CM | POA: Diagnosis not present

## 2016-12-29 DIAGNOSIS — Z7902 Long term (current) use of antithrombotics/antiplatelets: Secondary | ICD-10-CM | POA: Diagnosis not present

## 2016-12-29 DIAGNOSIS — I200 Unstable angina: Secondary | ICD-10-CM | POA: Diagnosis not present

## 2016-12-29 DIAGNOSIS — Z7982 Long term (current) use of aspirin: Secondary | ICD-10-CM | POA: Diagnosis not present

## 2016-12-29 DIAGNOSIS — R0789 Other chest pain: Secondary | ICD-10-CM | POA: Diagnosis not present

## 2016-12-29 DIAGNOSIS — Z87891 Personal history of nicotine dependence: Secondary | ICD-10-CM | POA: Diagnosis not present

## 2016-12-29 DIAGNOSIS — E669 Obesity, unspecified: Secondary | ICD-10-CM | POA: Diagnosis not present

## 2016-12-29 DIAGNOSIS — I25119 Atherosclerotic heart disease of native coronary artery with unspecified angina pectoris: Secondary | ICD-10-CM | POA: Diagnosis not present

## 2016-12-30 ENCOUNTER — Encounter: Payer: Self-pay | Admitting: Cardiology

## 2016-12-30 DIAGNOSIS — Z8249 Family history of ischemic heart disease and other diseases of the circulatory system: Secondary | ICD-10-CM | POA: Diagnosis not present

## 2016-12-30 DIAGNOSIS — I25708 Atherosclerosis of coronary artery bypass graft(s), unspecified, with other forms of angina pectoris: Secondary | ICD-10-CM | POA: Diagnosis not present

## 2016-12-30 DIAGNOSIS — E119 Type 2 diabetes mellitus without complications: Secondary | ICD-10-CM | POA: Diagnosis not present

## 2016-12-30 DIAGNOSIS — I119 Hypertensive heart disease without heart failure: Secondary | ICD-10-CM | POA: Diagnosis not present

## 2016-12-30 DIAGNOSIS — E784 Other hyperlipidemia: Secondary | ICD-10-CM | POA: Diagnosis not present

## 2016-12-30 DIAGNOSIS — F17211 Nicotine dependence, cigarettes, in remission: Secondary | ICD-10-CM | POA: Diagnosis not present

## 2016-12-30 DIAGNOSIS — E669 Obesity, unspecified: Secondary | ICD-10-CM | POA: Diagnosis not present

## 2016-12-30 DIAGNOSIS — I361 Nonrheumatic tricuspid (valve) insufficiency: Secondary | ICD-10-CM | POA: Diagnosis not present

## 2016-12-30 DIAGNOSIS — R079 Chest pain, unspecified: Secondary | ICD-10-CM

## 2017-01-05 DIAGNOSIS — R079 Chest pain, unspecified: Secondary | ICD-10-CM | POA: Diagnosis not present

## 2017-01-05 DIAGNOSIS — Z6830 Body mass index (BMI) 30.0-30.9, adult: Secondary | ICD-10-CM | POA: Diagnosis not present

## 2017-01-05 DIAGNOSIS — Z1389 Encounter for screening for other disorder: Secondary | ICD-10-CM | POA: Diagnosis not present

## 2017-01-10 ENCOUNTER — Telehealth: Payer: Self-pay | Admitting: Cardiology

## 2017-01-10 ENCOUNTER — Other Ambulatory Visit: Payer: Self-pay

## 2017-01-10 DIAGNOSIS — R079 Chest pain, unspecified: Secondary | ICD-10-CM

## 2017-01-10 MED ORDER — NITROGLYCERIN 0.4 MG SL SUBL: 0.4000 mg | SUBLINGUAL_TABLET | SUBLINGUAL | 11 refills | Status: DC | PRN

## 2017-01-10 NOTE — Telephone Encounter (Signed)
°*  STAT* If patient is at the pharmacy, call can be transferred to refill team.   1. Which medications need to be refilled? (please list name of each medication and dose if known) nitro  2. Which pharmacy/location (including street and city if local pharmacy) is medication to be sent to?[facility]   3. Do they need a 30 day or 90 day supply? [place]

## 2017-01-18 ENCOUNTER — Telehealth: Payer: Self-pay | Admitting: Cardiology

## 2017-01-18 NOTE — Telephone Encounter (Signed)
Has appt on Monday but wants to check with him about having a heart cath before then (seen at [facility] recently, records up front)

## 2017-01-18 NOTE — Telephone Encounter (Signed)
S/w pt and states that he had stress test and echo done at [facility] with normal results and for the 1st week he was doing well, since had been experiencing some chest discomfort on exertion with SOB. Pt is now wondering if maybe a Cath should be done. Pt had appt to f/u 7/16 but has now been rescheduled for 7/12 to discuss everything with Dr. Geraldo Pitter.

## 2017-01-19 ENCOUNTER — Encounter: Payer: Self-pay | Admitting: Cardiology

## 2017-01-19 ENCOUNTER — Ambulatory Visit (INDEPENDENT_AMBULATORY_CARE_PROVIDER_SITE_OTHER): Payer: Medicare Other | Admitting: Cardiology

## 2017-01-19 VITALS — BP 138/68 | HR 75 | Ht 70.0 in | Wt 203.0 lb

## 2017-01-19 DIAGNOSIS — I2510 Atherosclerotic heart disease of native coronary artery without angina pectoris: Secondary | ICD-10-CM

## 2017-01-19 DIAGNOSIS — Z87442 Personal history of urinary calculi: Secondary | ICD-10-CM | POA: Diagnosis not present

## 2017-01-19 DIAGNOSIS — E782 Mixed hyperlipidemia: Secondary | ICD-10-CM | POA: Insufficient documentation

## 2017-01-19 DIAGNOSIS — I25709 Atherosclerosis of coronary artery bypass graft(s), unspecified, with unspecified angina pectoris: Secondary | ICD-10-CM

## 2017-01-19 DIAGNOSIS — I209 Angina pectoris, unspecified: Secondary | ICD-10-CM | POA: Diagnosis not present

## 2017-01-19 HISTORY — DX: Mixed hyperlipidemia: E78.2

## 2017-01-19 MED ORDER — TICAGRELOR 90 MG PO TABS: 90.0000 mg | ORAL_TABLET | Freq: Two times a day (BID) | ORAL | 3 refills | Status: DC

## 2017-01-19 MED ORDER — TICAGRELOR 60 MG PO TABS: 90.0000 mg | ORAL_TABLET | Freq: Two times a day (BID) | ORAL | Status: DC

## 2017-01-19 MED ORDER — RANOLAZINE ER 500 MG PO TB12: 500.0000 mg | ORAL_TABLET | Freq: Two times a day (BID) | ORAL | 3 refills | Status: DC

## 2017-01-19 MED ORDER — ISOSORBIDE MONONITRATE ER 30 MG PO TB24: 30.0000 mg | ORAL_TABLET | Freq: Every day | ORAL | 3 refills | Status: DC

## 2017-01-19 NOTE — Progress Notes (Signed)
Cardiology Office Note:    Date:  01/19/2017   ID:  Glenn Roach, DOB [birth year], MRN [id number]  PCP:  Mateo Flow, MD  Cardiologist:  Jenean Lindau, MD   Referring MD: No ref. provider found     History of Present Illness:    Glenn Roach is a 71 y.o. male who is being seen today for the evaluation of Coronary artery disease at the request of No ref. provider found. The patient is a retired Stage manager and I have taken care of him in the past for the past several years and he is transferred from my previous practice. He mentions to me that he occasionally has some chest tightness which may or may not be related to exertion. He mentioned it asked to be 1 on a scale of 10 and 10 being the worst. It is interesting that he walked for several minutes on the treadmill and negative stress test recently without any symptoms at all. This was just a couple of weeks ago. He was exhausted with walking but did not complaining of any chest pain. This was reassured. At the time of my evaluation he is alert awake oriented and in no distress. 1 sublingual nitroglycerin helps his pain. His wife and daughter accompany him for this visit.  History reviewed. No pertinent past medical history.  History reviewed. No pertinent surgical history.  Current Medications: Current Meds  Medication Sig  . aspirin EC 81 MG tablet Take 81 mg by mouth.  . Coenzyme Q-10 200 MG CAPS Take 200 mg by mouth.  . Dulaglutide (TRULICITY [place]) Inject into the skin.  . fenofibrate 160 MG tablet Take 160 mg by mouth daily.  Marland Kitchen glimepiride (AMARYL) 2 MG tablet Take 2 mg by mouth.  . linagliptin (TRADJENTA) 5 MG TABS tablet Take 5 mg by mouth daily.   Marland Kitchen losartan (COZAAR) 50 MG tablet TAKE 50 mg twice daily  . metoprolol succinate (TOPROL-XL) 25 MG 24 hr tablet TAKE 1 TABLET(25 MG) BY MOUTH DAILY  . nitroGLYCERIN (NITROSTAT) 0.4 MG SL tablet Place 1 tablet (0.4 mg total) under the tongue every 5 (five) minutes as needed.    . pantoprazole (PROTONIX) 40 MG tablet Take 40 mg by mouth.  . Pitavastatin Calcium (LIVALO) 4 MG TABS TAKE 1 TABLET BY MOUTH EVERY DAY  . [DISCONTINUED] clopidogrel (PLAVIX) 75 MG tablet TAKE 1 TABLET(75 MG) BY MOUTH DAILY     Allergies:   Morphine; Ramipril; and Ticlopidine hcl   Social History   Social History  . Marital status: Married    Spouse name: N/A  . Number of children: N/A  . Years of education: N/A   Social History Main Topics  . Smoking status: Former Research scientist (life sciences)  . Smokeless tobacco: Never Used  . Alcohol use No  . Drug use: No  . Sexual activity: Not Asked   Other Topics Concern  . None   Social History Narrative  . None     Family History: The patient's family history is not on file.  ROS:   Please see the history of present illness.    All other systems reviewed and are negative.  EKGs/Labs/Other Studies Reviewed:    The following studies were reviewed today: I reviewed the records from [place] including stress test report at extensive length. It is to be noted that the patient also had mild renal insufficiency.   Recent Labs: No results found for requested labs within last 8760 hours.  Recent Lipid Panel  Component Value Date/Time   CHOL  09/08/2010 0415    154        ATP III CLASSIFICATION:  <200     mg/dL   Desirable  200-239  mg/dL   Borderline High  >=240    mg/dL   High          TRIG 461 (H) 09/08/2010 0415   HDL 29 (L) 09/08/2010 0415   CHOLHDL 5.3 09/08/2010 0415   VLDL UNABLE TO CALCULATE IF TRIGLYCERIDE OVER 400 mg/dL 09/08/2010 0415   LDLCALC  09/08/2010 0415    UNABLE TO CALCULATE IF TRIGLYCERIDE OVER 400 mg/dL        Total Cholesterol/HDL:CHD Risk Coronary Heart Disease Risk Table                     Men   Women  1/2 Average Risk   3.4   3.3  Average Risk       5.0   4.4  2 X Average Risk   9.6   7.1  3 X Average Risk  23.4   11.0        Use the calculated Patient Ratio above and the CHD Risk Table to  determine the patient's CHD Risk.        ATP III CLASSIFICATION (LDL):  <100     mg/dL   Optimal  100-129  mg/dL   Near or Above                    Optimal  130-159  mg/dL   Borderline  160-189  mg/dL   High  >190     mg/dL   Very High    Physical Exam:    VS:  BP 138/68   Pulse 75   Ht 5\' 10"  (1.778 m)   Wt 203 lb 0.6 oz (92.1 kg)   SpO2 96%   BMI 29.13 kg/m     Wt Readings from Last 3 Encounters:  01/19/17 203 lb 0.6 oz (92.1 kg)  10/29/08 219 lb (99.3 kg)     GEN: Patient is in no acute distress HEENT: Normal NECK: No JVD; No carotid bruits LYMPHATICS: No lymphadenopathy CARDIAC: 2/6 systolic murmur at the apex. RESPIRATORY:  Clear to auscultation without rales, wheezing or rhonchi  ABDOMEN: Soft, non-tender, non-distended MUSCULOSKELETAL:  No edema; No deformity  SKIN: Warm and dry NEUROLOGIC:  Alert and oriented x 3 PSYCHIATRIC:  Normal affect   ASSESSMENT:    1. Coronary artery disease, angina presence unspecified, unspecified vessel or lesion type, unspecified whether native or transplanted heart   2. Coronary artery disease involving coronary bypass graft of native heart with angina pectoris ([place])   3. Mixed dyslipidemia   4. Angina pectoris ([place])   5. NEPHROLITHIASIS, HX OF    PLAN:    In order of problems listed above:  I had an extensive discussion with the patient. The patient has significant concerns. His angina appears to be of some concern but is relieved by nitroglycerin. And I'm not even sure whether it's definitely angina because he had such an excellent effort tolerance on the stress test. In view of this I gave him the option of invasive and noninvasive evaluation. Pros and cons of each modality were explained to him and his family at extensive length benefits and risks explained and he vocalized understanding and had multiple questions which were answered to their satisfaction. The total visit including this discussion took 15 minutes. In view of  the fact that the patient has mild renal insufficiency he opted for noninvasive strategy and medical management. He understands the risks and he knows to go to the nearest emergency room for any significant consults. I have made the following changes in his medications. I have given him isosorbide mononitrate 30 mg daily. I have changed his antiplatelet therapy from Plavix to Brilinta. I have also initiated him on Ranexa 500 mg twice a day and subsequently in a week to 1000 mg twice a day. He'll be seen in follow-up appointment in a week or earlier if he has any concerns. He knows to go to the nearest emergency room for any significant consults. I told him not to exert himself and to only take care of activities for daily living for a week .  Medi concerns.cation Adjustments/Labs and Tests Ordered: Current medicines are reviewed at length with the patientfrom Plavix to Brilinta.  Concerns regarding medicines are outlined above.  No orders of the defined types were placed in this encounter.  Meds ordered this encounter  Medications  . isosorbide mononitrate (IMDUR) 30 MG 24 hr tablet    Sig: Take 1 tablet (30 mg total) by mouth daily.    Dispense:  90 tablet    Refill:  3  . ranolazine (RANEXA) 500 MG 12 hr tablet    Sig: Take 1 tablet (500 mg total) by mouth 2 (two) times daily. For 1 week. Then begin taking 1,000mg  two times daily.    Dispense:  90 tablet    Refill:  3  . DISCONTD: ticagrelor (BRILINTA) tablet 90 mg    Take 180 mg for first dose. Then begin 90 mg twice daily.  . ticagrelor (BRILINTA) 90 MG TABS tablet    Sig: Take 1 tablet (90 mg total) by mouth 2 (two) times daily. Take 180 mg first dose. Then begin 90 mg by mouth two times daily.    Dispense:  180 tablet    Refill:  3    Signed, Jenean Lindau, MD  01/19/2017 10:33 AM    [facility]

## 2017-01-19 NOTE — Patient Instructions (Signed)
Medication Instructions:  Your physician has recommended you make the following change in your medication:  STOP Plavix START Brilinta 90 twice daily. First dose is 180 mg. START Imdur 30 mg daily START Ranexa 500 mg twice daily for 1 week and then begin 1000 mg twice daily.   Labwork: None  Testing/Procedures: None  Follow-Up: Your physician recommends that you schedule a follow-up appointment in: 1 week.   Any Other Special Instructions Will Be Listed Below (If Applicable).     If you need a refill on your cardiac medications before your next appointment, please call your pharmacy.

## 2017-01-23 ENCOUNTER — Ambulatory Visit: Payer: PRIVATE HEALTH INSURANCE | Admitting: Cardiology

## 2017-01-26 ENCOUNTER — Ambulatory Visit (INDEPENDENT_AMBULATORY_CARE_PROVIDER_SITE_OTHER): Payer: Medicare Other | Admitting: Cardiology

## 2017-01-26 ENCOUNTER — Ambulatory Visit: Payer: Medicare Other | Admitting: Cardiology

## 2017-01-26 ENCOUNTER — Encounter: Payer: Self-pay | Admitting: Cardiology

## 2017-01-26 VITALS — BP 118/56 | HR 80 | Ht 70.0 in | Wt 202.0 lb

## 2017-01-26 DIAGNOSIS — I209 Angina pectoris, unspecified: Secondary | ICD-10-CM

## 2017-01-26 DIAGNOSIS — E782 Mixed hyperlipidemia: Secondary | ICD-10-CM | POA: Diagnosis not present

## 2017-01-26 DIAGNOSIS — I25709 Atherosclerosis of coronary artery bypass graft(s), unspecified, with unspecified angina pectoris: Secondary | ICD-10-CM | POA: Diagnosis not present

## 2017-01-26 DIAGNOSIS — E088 Diabetes mellitus due to underlying condition with unspecified complications: Secondary | ICD-10-CM

## 2017-01-26 NOTE — Patient Instructions (Signed)
Medication Instructions: Your physician recommends that you continue on your current medications as directed. Please refer to the Current Medication list given to you today.  Labwork: None Ordered  Testing/Procedures: None Ordered  Follow-Up: Your physician recommends that you schedule a follow-up appointment with Dr. Geraldo Pitter in 1 month.   Any Other Special Instructions Will Be Listed Below (If Applicable).     If you need a refill on your cardiac medications before your next appointment, please call your pharmacy.

## 2017-01-26 NOTE — Progress Notes (Signed)
Cardiology Office Note:    Date:  01/26/2017   ID:  Glenn Roach, DOB June 12, 1946, MRN [id number]  PCP:  Mateo Flow, MD  Cardiologist:  Jenean Lindau, MD   Referring MD: Mateo Flow, MD    ASSESSMENT:    1. Coronary artery disease involving coronary bypass graft of native heart with angina pectoris ([place])   2. Angina pectoris ([place])   3. 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 Mixed dyslipidemia    PLAN:    In order of problems listed above:  1. Patient tells me that he feels much better. He has has no symptos of cp since last evaluation and the medication have helped him immensely. He and his wife are happy about this. He did develop some brilinta related SOB which is getting significantly better.  2. He mentions of some BP drop in the !00 and teens with no symptoms. 3. He was recommended cardiac rehab, but declined at this point and want to start exercising himslef. Followup in one month or earlier if he has any symptoms.   Medication Adjustments/Labs and Tests Ordered: Current medicines are reviewed at length with the patient today.  Concerns regarding medicines are outlined above.  No orders of the defined types were placed in this encounter.  No orders of the defined types were placed in this encounter.    Chief Complaint  Patient presents with  . Follow-up    medication change/ a lot of SOB and BP went down, but has gotten better. 2 episodes of very mild chest pain.      History of Present Illness:    Glenn Roach is a 71 y.o. male seen recently for angina. He was seen in the clinic last week for follow up for CP and decided to opt for medical mgmt.. Patient mentions  To me that this has worked very well for him. He is delighted about this.  Past Medical History:  Diagnosis Date  . CAD (coronary artery disease)   . Chest tightness   . Dyslipidemia     History reviewed.  No pertinent surgical history.  Current Medications: Current Meds  Medication Sig  . aspirin EC 81 MG tablet Take 81 mg by mouth.  . Coenzyme Q-10 200 MG CAPS Take 200 mg by mouth.  . Dulaglutide (TRULICITY [place]) Inject into the skin.  . fenofibrate 160 MG tablet Take 160 mg by mouth daily.  Marland Kitchen glimepiride (AMARYL) 2 MG tablet Take 2 mg by mouth.  . isosorbide mononitrate (IMDUR) 30 MG 24 hr tablet Take 1 tablet (30 mg total) by mouth daily.  Marland Kitchen linagliptin (TRADJENTA) 5 MG TABS tablet Take 5 mg by mouth daily.   Marland Kitchen losartan (COZAAR) 50 MG tablet TAKE 50 mg twice daily  . metoprolol succinate (TOPROL-XL) 25 MG 24 hr tablet TAKE 1 TABLET(25 MG) BY MOUTH DAILY  . nitroGLYCERIN (NITROSTAT) 0.4 MG SL tablet Place 1 tablet (0.4 mg total) under the tongue every 5 (five) minutes as needed.  . pantoprazole (PROTONIX) 40 MG tablet Take 40 mg by mouth.  . Pitavastatin Calcium (LIVALO) 4 MG TABS TAKE 1 TABLET BY MOUTH EVERY DAY  . ranolazine (RANEXA) 500 MG 12 hr tablet Take 1 tablet (500 mg total) by mouth 2 (two) times daily. For 1 week. Then begin taking 1,000mg  two times daily.  . ticagrelor (BRILINTA) 90 MG TABS tablet Take 1 tablet (90 mg total) by  mouth 2 (two) times daily. Take 180 mg first dose. Then begin 90 mg by mouth two times daily.     Allergies:   Morphine; Ramipril; and Ticlopidine hcl   Social History   Social History  . Marital status: Married    Spouse name: N/A  . Number of children: N/A  . Years of education: N/A   Social History Main Topics  . Smoking status: Former Research scientist (life sciences)  . Smokeless tobacco: Never Used  . Alcohol use No  . Drug use: No  . Sexual activity: Not Asked   Other Topics Concern  . None   Social History Narrative  . None     Family History: The patient's family history is not on file.  ROS:   Please see the history of present illness.    All other systems reviewed and are negative.  EKGs/Labs/Other Studies Reviewed:    The following studies  were reviewed today: I reviewed previous notes.   Recent Labs: No results found for requested labs within last 8760 hours.  Recent Lipid Panel    Component Value Date/Time   CHOL  09/08/2010 0415    154        ATP III CLASSIFICATION:  <200     mg/dL   Desirable  200-239  mg/dL   Borderline High  >=240    mg/dL   High          TRIG 461 (H) 09/08/2010 0415   HDL 29 (L) 09/08/2010 0415   CHOLHDL 5.3 09/08/2010 0415   VLDL UNABLE TO CALCULATE IF TRIGLYCERIDE OVER 400 mg/dL 09/08/2010 0415   LDLCALC  09/08/2010 0415    UNABLE TO CALCULATE IF TRIGLYCERIDE OVER 400 mg/dL        Total Cholesterol/HDL:CHD Risk Coronary Heart Disease Risk Table                     Men   Women  1/2 Average Risk   3.4   3.3  Average Risk       5.0   4.4  2 X Average Risk   9.6   7.1  3 X Average Risk  23.4   11.0        Use the calculated Patient Ratio above and the CHD Risk Table to determine the patient's CHD Risk.        ATP III CLASSIFICATION (LDL):  <100     mg/dL   Optimal  100-129  mg/dL   Near or Above                    Optimal  130-159  mg/dL   Borderline  160-189  mg/dL   High  >190     mg/dL   Very High    Physical Exam:    VS:  BP (!) 118/56   Pulse 80   Ht 5\' 10"  (1.778 m)   Wt 202 lb 0.6 oz (91.6 kg)   SpO2 96%   BMI 28.99 kg/m     Wt Readings from Last 3 Encounters:  01/26/17 202 lb 0.6 oz (91.6 kg)  01/19/17 203 lb 0.6 oz (92.1 kg)  10/29/08 219 lb (99.3 kg)     GEN: Patient is in no acute distress HEENT: Normal NECK: No JVD; No carotid bruits LYMPHATICS: No lymphadenopathy CARDIAC: Hear sounds regular, 2/6 systolic murmur at the apex. RESPIRATORY:  Clear to auscultation without rales, wheezing or rhonchi  ABDOMEN: Soft, non-tender, non-distended MUSCULOSKELETAL:  No edema;  No deformity  SKIN: Warm and dry NEUROLOGIC:  Alert and oriented x 3 PSYCHIATRIC:  Normal affect   Signed, Jenean Lindau, MD  01/26/2017 1:48 PM    [facility]

## 2017-02-13 ENCOUNTER — Other Ambulatory Visit: Payer: Self-pay

## 2017-02-13 ENCOUNTER — Telehealth: Payer: Self-pay

## 2017-02-13 MED ORDER — RANOLAZINE ER 1000 MG PO TB12: 1000.0000 mg | ORAL_TABLET | Freq: Two times a day (BID) | ORAL | 3 refills | Status: DC

## 2017-02-13 NOTE — Telephone Encounter (Signed)
Per pt ranexa sent in wrong needs 1000mg  bid sent to [facility] still call pt at cell number. he has questions. thanks

## 2017-02-13 NOTE — Telephone Encounter (Signed)
Rx fixed and sent to pharmacy and called pt to inform him. Pt states he didn't have any questions just wanted to confirm that medication was sent.

## 2017-02-23 ENCOUNTER — Encounter: Payer: Self-pay | Admitting: Cardiology

## 2017-02-23 ENCOUNTER — Ambulatory Visit (INDEPENDENT_AMBULATORY_CARE_PROVIDER_SITE_OTHER): Payer: Medicare Other | Admitting: Cardiology

## 2017-02-23 VITALS — BP 130/68 | HR 76 | Ht 70.0 in | Wt 203.0 lb

## 2017-02-23 DIAGNOSIS — I209 Angina pectoris, unspecified: Secondary | ICD-10-CM

## 2017-02-23 DIAGNOSIS — I25709 Atherosclerosis of coronary artery bypass graft(s), unspecified, with unspecified angina pectoris: Secondary | ICD-10-CM

## 2017-02-23 DIAGNOSIS — E088 Diabetes mellitus due to underlying condition with unspecified complications: Secondary | ICD-10-CM

## 2017-02-23 DIAGNOSIS — E782 Mixed hyperlipidemia: Secondary | ICD-10-CM

## 2017-02-23 NOTE — Progress Notes (Signed)
Cardiology Office Note:    Date:  02/23/2017   ID:  Glenn Roach, DOB 08/04/1945, MRN [id number]  PCP:  Mateo Flow, MD  Cardiologist:  Jenean Lindau, MD   Referring MD: Mateo Flow, MD    ASSESSMENT:    1. Angina pectoris ([place])   2. Coronary artery disease involving coronary bypass graft of native heart with angina pectoris ([place])   3. 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 Mixed dyslipidemia    PLAN:    In order of problems listed above:  1. Secondary prevention stressed to the patient. Importance of compliance with diet and medications stressed and he vocalized understanding. He has a regular exercise pattern and I'm happy about this. 2. His blood pressure stable. Diet was discussed with dyslipidemia and diabetes mellitus and he vocalized understanding. Risks of obesity explained he will be seen in follow-up appointment in 3 months or earlier if he has any concerns.   Medication Adjustments/Labs and Tests Ordered: Current medicines are reviewed at length with the patient today.  Concerns regarding medicines are outlined above.  No orders of the defined types were placed in this encounter.  No orders of the defined types were placed in this encounter.    Chief Complaint  Patient presents with  . Follow-up  . Chest Pain    very mild but over all doing well.      History of Present Illness:    Glenn Roach is a 71 y.o. male.The patient has past medical history of coronary artery disease. He has presented to [place] with recurrent angina for which she underwent coronary angiography which revealed small vessel disease. Subsequently she has been patient initiated on nitrates and the ranolazine and he seems to be tolerating it well and is doing fine. No chest pain orthopnea or PND. He walks more than a mile on a regular basis and is happy about it. He is here for follow-up appointment. At  the time of my evaluation is alert awake oriented and in no distress.  Past Medical History:  Diagnosis Date  . CAD (coronary artery disease)   . Chest tightness   . Dyslipidemia     History reviewed. No pertinent surgical history.  Current Medications: Current Meds  Medication Sig  . aspirin EC 81 MG tablet Take 81 mg by mouth.  . Coenzyme Q-10 200 MG CAPS Take 200 mg by mouth.  . Dulaglutide (TRULICITY [place]) Inject into the skin.  . fenofibrate 160 MG tablet Take 160 mg by mouth daily.  Marland Kitchen glimepiride (AMARYL) 2 MG tablet Take 2 mg by mouth.  . isosorbide mononitrate (IMDUR) 30 MG 24 hr tablet Take 1 tablet (30 mg total) by mouth daily.  Marland Kitchen linagliptin (TRADJENTA) 5 MG TABS tablet Take 5 mg by mouth daily.   Marland Kitchen losartan (COZAAR) 50 MG tablet TAKE 50 mg twice daily  . metoprolol succinate (TOPROL-XL) 25 MG 24 hr tablet TAKE 1 TABLET(25 MG) BY MOUTH DAILY  . nitroGLYCERIN (NITROSTAT) 0.4 MG SL tablet Place 1 tablet (0.4 mg total) under the tongue every 5 (five) minutes as needed.  . pantoprazole (PROTONIX) 40 MG tablet Take 40 mg by mouth.  . Pitavastatin Calcium (LIVALO) 4 MG TABS TAKE 1 TABLET BY MOUTH EVERY DAY  . ranolazine (RANEXA) 1000 MG SR tablet Take 1 tablet (1,000 mg total) by mouth 2 (two) times daily. For 1 week. Then begin taking 1,000mg  two  times daily.  . ticagrelor (BRILINTA) 90 MG TABS tablet Take 1 tablet (90 mg total) by mouth 2 (two) times daily. Take 180 mg first dose. Then begin 90 mg by mouth two times daily.     Allergies:   Morphine; Ramipril; and Ticlopidine hcl   Social History   Social History  . Marital status: Married    Spouse name: N/A  . Number of children: N/A  . Years of education: N/A   Social History Main Topics  . Smoking status: Former Research scientist (life sciences)  . Smokeless tobacco: Never Used  . Alcohol use No  . Drug use: No  . Sexual activity: Not Asked   Other Topics Concern  . None   Social History Narrative  . None     Family History: The  patient's family history is not on file.  ROS:   Please see the history of present illness.    All other systems reviewed and are negative.  EKGs/Labs/Other Studies Reviewed:    The following studies were reviewed today: I reviewed records extensively and discuss questions with him.  Next   Recent Labs: No results found for requested labs within last 8760 hours.  Recent Lipid Panel    Component Value Date/Time   CHOL  09/08/2010 0415    154        ATP III CLASSIFICATION:  <200     mg/dL   Desirable  200-239  mg/dL   Borderline High  >=240    mg/dL   High          TRIG 461 (H) 09/08/2010 0415   HDL 29 (L) 09/08/2010 0415   CHOLHDL 5.3 09/08/2010 0415   VLDL UNABLE TO CALCULATE IF TRIGLYCERIDE OVER 400 mg/dL 09/08/2010 0415   LDLCALC  09/08/2010 0415    UNABLE TO CALCULATE IF TRIGLYCERIDE OVER 400 mg/dL        Total Cholesterol/HDL:CHD Risk Coronary Heart Disease Risk Table                     Men   Women  1/2 Average Risk   3.4   3.3  Average Risk       5.0   4.4  2 X Average Risk   9.6   7.1  3 X Average Risk  23.4   11.0        Use the calculated Patient Ratio above and the CHD Risk Table to determine the patient's CHD Risk.        ATP III CLASSIFICATION (LDL):  <100     mg/dL   Optimal  100-129  mg/dL   Near or Above                    Optimal  130-159  mg/dL   Borderline  160-189  mg/dL   High  >190     mg/dL   Very High    Physical Exam:    VS:  BP 130/68   Pulse 76   Ht 5\' 10"  (1.778 m)   Wt 203 lb (92.1 kg)   SpO2 98%   BMI 29.13 kg/m     Wt Readings from Last 3 Encounters:  02/23/17 203 lb (92.1 kg)  01/26/17 202 lb 0.6 oz (91.6 kg)  01/19/17 203 lb 0.6 oz (92.1 kg)     GEN: Patient is in no acute distress HEENT: Normal NECK: No JVD; No carotid bruits LYMPHATICS: No lymphadenopathy CARDIAC: Hear sounds regular, 2/6 systolic murmur at  the apex. RESPIRATORY:  Clear to auscultation without rales, wheezing or rhonchi  ABDOMEN: Soft,  non-tender, non-distended MUSCULOSKELETAL:  No edema; No deformity  SKIN: Warm and dry NEUROLOGIC:  Alert and oriented x 3 PSYCHIATRIC:  Normal affect   Signed, Jenean Lindau, MD  02/23/2017 2:21 PM    [facility]

## 2017-02-23 NOTE — Patient Instructions (Addendum)
Medication Instructions:  Your physician recommends that you continue on your current medications as directed. Please refer to the Current Medication list given to you today.  Labwork: None   Testing/Procedures: None   Follow-Up: Your physician recommends that you schedule a follow-up appointment in: 3 months   Any Other Special Instructions Will Be Listed Below (If Applicable).  Please note that any paperwork needing to be filled out by the provider will need to be addressed at the front desk prior to seeing the provider. Please note that any paperwork FMLA, Disability or other documents regarding health condition is subject to a $25.00 charge that must be received prior to completion of paperwork in the form of a money order or check.     If you need a refill on your cardiac medications before your next appointment, please call your pharmacy.  

## 2017-04-10 DIAGNOSIS — N183 Chronic kidney disease, stage 3 (moderate): Secondary | ICD-10-CM | POA: Diagnosis not present

## 2017-04-10 DIAGNOSIS — I10 Essential (primary) hypertension: Secondary | ICD-10-CM | POA: Diagnosis not present

## 2017-04-10 DIAGNOSIS — R82998 Other abnormal findings in urine: Secondary | ICD-10-CM | POA: Diagnosis not present

## 2017-04-10 DIAGNOSIS — Z6829 Body mass index (BMI) 29.0-29.9, adult: Secondary | ICD-10-CM | POA: Diagnosis not present

## 2017-04-10 DIAGNOSIS — Z87442 Personal history of urinary calculi: Secondary | ICD-10-CM | POA: Diagnosis not present

## 2017-04-10 DIAGNOSIS — Z23 Encounter for immunization: Secondary | ICD-10-CM | POA: Diagnosis not present

## 2017-04-10 DIAGNOSIS — E1165 Type 2 diabetes mellitus with hyperglycemia: Secondary | ICD-10-CM | POA: Diagnosis not present

## 2017-05-03 DIAGNOSIS — N200 Calculus of kidney: Secondary | ICD-10-CM | POA: Diagnosis not present

## 2017-05-03 DIAGNOSIS — N189 Chronic kidney disease, unspecified: Secondary | ICD-10-CM | POA: Diagnosis not present

## 2017-05-03 DIAGNOSIS — N281 Cyst of kidney, acquired: Secondary | ICD-10-CM | POA: Diagnosis not present

## 2017-05-03 DIAGNOSIS — R82998 Other abnormal findings in urine: Secondary | ICD-10-CM | POA: Diagnosis not present

## 2017-05-24 ENCOUNTER — Encounter: Payer: Self-pay | Admitting: Cardiology

## 2017-05-24 ENCOUNTER — Ambulatory Visit: Payer: Medicare Other | Admitting: Cardiology

## 2017-05-24 ENCOUNTER — Ambulatory Visit (INDEPENDENT_AMBULATORY_CARE_PROVIDER_SITE_OTHER): Payer: Medicare Other | Admitting: Cardiology

## 2017-05-24 VITALS — BP 114/56 | HR 79 | Ht 69.0 in | Wt 207.1 lb

## 2017-05-24 DIAGNOSIS — I25709 Atherosclerosis of coronary artery bypass graft(s), unspecified, with unspecified angina pectoris: Secondary | ICD-10-CM | POA: Diagnosis not present

## 2017-05-24 DIAGNOSIS — I209 Angina pectoris, unspecified: Secondary | ICD-10-CM

## 2017-05-24 DIAGNOSIS — E088 Diabetes mellitus due to underlying condition with unspecified complications: Secondary | ICD-10-CM

## 2017-05-24 DIAGNOSIS — E782 Mixed hyperlipidemia: Secondary | ICD-10-CM | POA: Diagnosis not present

## 2017-05-24 MED ORDER — CLOPIDOGREL BISULFATE 75 MG PO TABS: 75.0000 mg | ORAL_TABLET | Freq: Every day | ORAL | 0 refills | Status: DC

## 2017-05-24 NOTE — Progress Notes (Signed)
Cardiology Office Note:    Date:  05/24/2017   ID:  Glenn Roach, DOB Jun 06, 1946, MRN [id number]  PCP:  Mateo Flow, MD  Cardiologist:  Jenean Lindau, MD   Referring MD: Mateo Flow, MD    ASSESSMENT:    1. Coronary artery disease involving coronary bypass graft of native heart with angina pectoris ([place])   2. Angina pectoris ([place])   3. 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 Mixed dyslipidemia    PLAN:    In order of problems listed above:  1. Secondary prevention stressed to the patient.  Importance of compliance with diet and medication stressed and he vocalized understanding. 2. I asked him to get into a more rigorous exercise program.  Diet was discussed with dyslipidemia, obesity and diabetes mellitus and he vocalized understanding. 3. Per his request I will switch him from Brilinta to Plavix. 4. Patient will be seen in follow-up appointment in 6 months or earlier if the patient has any concerns    Medication Adjustments/Labs and Tests Ordered: Current medicines are reviewed at length with the patient today.  Concerns regarding medicines are outlined above.  No orders of the defined types were placed in this encounter.  Meds ordered this encounter  Medications  . clopidogrel (PLAVIX) 75 MG tablet    Sig: Take 1 tablet (75 mg total) daily by mouth.    Dispense:  60 tablet    Refill:  0     Chief Complaint  Patient presents with  . Follow-up    doing better      History of Present Illness:    Glenn Roach is a 71 y.o. male.  The patient has known coronary artery disease, essential hypertension, diabetes mellitus and dyslipidemia.  He denies any problems at this time and takes care of activities of daily living.  No chest pain orthopnea or PND.  The patient mentions to me that he ambulates on a regular basis and has been walking 20-25 minutes without any problems.  His  only issue is shortness of breath and this has been initiated with the Brilinta.  He wants to try any other medication.  He has used Plavix in the past and that has not made him have any side effects.  He requests to go back to Plavix.  Past Medical History:  Diagnosis Date  . CAD (coronary artery disease)   . Chest tightness   . Dyslipidemia     History reviewed. No pertinent surgical history.  Current Medications: Current Meds  Medication Sig  . aspirin EC 81 MG tablet Take 81 mg by mouth.  . Coenzyme Q-10 200 MG CAPS Take 200 mg by mouth.  . Dulaglutide (TRULICITY [place]) Inject into the skin.  . fenofibrate 160 MG tablet Take 160 mg by mouth daily.  Marland Kitchen glimepiride (AMARYL) 2 MG tablet Take 2 mg by mouth.  . isosorbide mononitrate (IMDUR) 30 MG 24 hr tablet Take 1 tablet (30 mg total) by mouth daily.  Marland Kitchen linagliptin (TRADJENTA) 5 MG TABS tablet Take 5 mg by mouth daily.   Marland Kitchen losartan (COZAAR) 50 MG tablet TAKE 50 mg twice daily  . metoprolol succinate (TOPROL-XL) 25 MG 24 hr tablet TAKE 1 TABLET(25 MG) BY MOUTH DAILY  . nitroGLYCERIN (NITROSTAT) 0.4 MG SL tablet Place 1 tablet (0.4 mg total) under the tongue every 5 (five) minutes as needed.  . pantoprazole (PROTONIX) 40 MG tablet Take  40 mg by mouth.  . Pitavastatin Calcium (LIVALO) 4 MG TABS TAKE 1 TABLET BY MOUTH EVERY DAY  . ranolazine (RANEXA) 1000 MG SR tablet Take 1 tablet (1,000 mg total) by mouth 2 (two) times daily. For 1 week. Then begin taking 1,000mg  two times daily.  . [DISCONTINUED] ticagrelor (BRILINTA) 90 MG TABS tablet Take 1 tablet (90 mg total) by mouth 2 (two) times daily. Take 180 mg first dose. Then begin 90 mg by mouth two times daily.     Allergies:   Morphine; Ramipril; and Ticlopidine hcl   Social History   Socioeconomic History  . Marital status: Married    Spouse name: None  . Number of children: None  . Years of education: None  . Highest education level: None  Social Needs  . Financial resource  strain: None  . Food insecurity - worry: None  . Food insecurity - inability: None  . Transportation needs - medical: None  . Transportation needs - non-medical: None  Occupational History  . None  Tobacco Use  . Smoking status: Former Research scientist (life sciences)  . Smokeless tobacco: Never Used  Substance and Sexual Activity  . Alcohol use: No  . Drug use: No  . Sexual activity: None  Other Topics Concern  . None  Social History Narrative  . None     Family History: The patient's family history includes Heart attack in his father; Heart disease in his mother; Heart failure in his mother; Stroke in his father.  ROS:   Please see the history of present illness.    All other systems reviewed and are negative.  EKGs/Labs/Other Studies Reviewed:    The following studies were reviewed today: I reviewed patient's office record and discussed my findings with him extensively.  He has brought blood work with him I reviewed this.   Recent Labs: No results found for requested labs within last 8760 hours.  Recent Lipid Panel    Component Value Date/Time   CHOL  09/08/2010 0415    154        ATP III CLASSIFICATION:  <200     mg/dL   Desirable  200-239  mg/dL   Borderline High  >=240    mg/dL   High          TRIG 461 (H) 09/08/2010 0415   HDL 29 (L) 09/08/2010 0415   CHOLHDL 5.3 09/08/2010 0415   VLDL UNABLE TO CALCULATE IF TRIGLYCERIDE OVER 400 mg/dL 09/08/2010 0415   LDLCALC  09/08/2010 0415    UNABLE TO CALCULATE IF TRIGLYCERIDE OVER 400 mg/dL        Total Cholesterol/HDL:CHD Risk Coronary Heart Disease Risk Table                     Men   Women  1/2 Average Risk   3.4   3.3  Average Risk       5.0   4.4  2 X Average Risk   9.6   7.1  3 X Average Risk  23.4   11.0        Use the calculated Patient Ratio above and the CHD Risk Table to determine the patient's CHD Risk.        ATP III CLASSIFICATION (LDL):  <100     mg/dL   Optimal  100-129  mg/dL   Near or Above                     Optimal  130-159  mg/dL   Borderline  160-189  mg/dL   High  >190     mg/dL   Very High    Physical Exam:    VS:  BP (!) 114/56   Pulse 79   Ht 5\' 9"  (1.753 m)   Wt 207 lb 1.3 oz (93.9 kg)   SpO2 97%   BMI 30.58 kg/m     Wt Readings from Last 3 Encounters:  05/24/17 207 lb 1.3 oz (93.9 kg)  02/23/17 203 lb (92.1 kg)  01/26/17 202 lb 0.6 oz (91.6 kg)     GEN: Patient is in no acute distress HEENT: Normal NECK: No JVD; No carotid bruits LYMPHATICS: No lymphadenopathy CARDIAC: Hear sounds regular, 2/6 systolic murmur at the apex. RESPIRATORY:  Clear to auscultation without rales, wheezing or rhonchi  ABDOMEN: Soft, non-tender, non-distended MUSCULOSKELETAL:  No edema; No deformity  SKIN: Warm and dry NEUROLOGIC:  Alert and oriented x 3 PSYCHIATRIC:  Normal affect   Signed, Jenean Lindau, MD  05/24/2017 4:30 PM    [facility]

## 2017-05-24 NOTE — Patient Instructions (Signed)
Medication Instructions:  Your physician has recommended you make the following change in your medication:  STOP brilinta START plavix  Labwork: None  Testing/Procedures: None  Follow-Up: Your physician recommends that you schedule a follow-up appointment in: 6 months   Any Other Special Instructions Will Be Listed Below (If Applicable).     If you need a refill on your cardiac medications before your next appointment, please call your pharmacy.   [place], RN, BSN

## 2017-05-25 ENCOUNTER — Other Ambulatory Visit: Payer: Self-pay

## 2017-05-25 MED ORDER — CLOPIDOGREL BISULFATE 75 MG PO TABS: 75.0000 mg | ORAL_TABLET | Freq: Every day | ORAL | 0 refills | Status: DC

## 2017-05-26 ENCOUNTER — Other Ambulatory Visit: Payer: Self-pay

## 2017-05-26 MED ORDER — PITAVASTATIN CALCIUM 4 MG PO TABS: 1.0000 | ORAL_TABLET | Freq: Every day | ORAL | 1 refills | Status: DC

## 2017-07-19 ENCOUNTER — Other Ambulatory Visit: Payer: Self-pay

## 2017-07-19 DIAGNOSIS — E119 Type 2 diabetes mellitus without complications: Secondary | ICD-10-CM | POA: Diagnosis not present

## 2017-07-19 DIAGNOSIS — H25013 Cortical age-related cataract, bilateral: Secondary | ICD-10-CM | POA: Diagnosis not present

## 2017-07-19 MED ORDER — METOPROLOL SUCCINATE ER 25 MG PO TB24: 25.0000 mg | ORAL_TABLET | Freq: Every day | ORAL | 1 refills | Status: DC

## 2017-08-23 ENCOUNTER — Other Ambulatory Visit: Payer: Self-pay

## 2017-08-23 MED ORDER — CLOPIDOGREL BISULFATE 75 MG PO TABS: 75.0000 mg | ORAL_TABLET | Freq: Every day | ORAL | 1 refills | Status: DC

## 2017-11-20 ENCOUNTER — Encounter: Payer: Self-pay | Admitting: Cardiology

## 2017-11-27 DIAGNOSIS — E1165 Type 2 diabetes mellitus with hyperglycemia: Secondary | ICD-10-CM | POA: Diagnosis not present

## 2017-11-27 DIAGNOSIS — E1129 Type 2 diabetes mellitus with other diabetic kidney complication: Secondary | ICD-10-CM | POA: Diagnosis not present

## 2017-11-27 DIAGNOSIS — N183 Chronic kidney disease, stage 3 (moderate): Secondary | ICD-10-CM | POA: Diagnosis not present

## 2017-11-27 DIAGNOSIS — Z0000 Encounter for general adult medical examination without abnormal findings: Secondary | ICD-10-CM | POA: Diagnosis not present

## 2017-11-27 DIAGNOSIS — E782 Mixed hyperlipidemia: Secondary | ICD-10-CM | POA: Diagnosis not present

## 2017-11-28 ENCOUNTER — Other Ambulatory Visit: Payer: Self-pay

## 2017-11-28 DIAGNOSIS — R079 Chest pain, unspecified: Secondary | ICD-10-CM

## 2017-11-28 MED ORDER — ISOSORBIDE MONONITRATE ER 30 MG PO TB24: 30.0000 mg | ORAL_TABLET | Freq: Every day | ORAL | 0 refills | Status: DC

## 2017-11-28 MED ORDER — FENOFIBRATE 160 MG PO TABS: 160.0000 mg | ORAL_TABLET | Freq: Every day | ORAL | 0 refills | Status: DC

## 2017-11-28 MED ORDER — RANOLAZINE ER 1000 MG PO TB12: 1000.0000 mg | ORAL_TABLET | Freq: Two times a day (BID) | ORAL | 0 refills | Status: DC

## 2017-11-28 MED ORDER — METOPROLOL SUCCINATE ER 25 MG PO TB24: 25.0000 mg | ORAL_TABLET | Freq: Every day | ORAL | 0 refills | Status: DC

## 2017-11-28 MED ORDER — NITROGLYCERIN 0.4 MG SL SUBL: 0.4000 mg | SUBLINGUAL_TABLET | SUBLINGUAL | 11 refills | Status: DC | PRN

## 2017-11-28 MED ORDER — PITAVASTATIN CALCIUM 4 MG PO TABS: 1.0000 | ORAL_TABLET | Freq: Every day | ORAL | 0 refills | Status: DC

## 2017-11-30 DIAGNOSIS — Z125 Encounter for screening for malignant neoplasm of prostate: Secondary | ICD-10-CM | POA: Diagnosis not present

## 2017-11-30 DIAGNOSIS — E785 Hyperlipidemia, unspecified: Secondary | ICD-10-CM | POA: Diagnosis not present

## 2017-11-30 DIAGNOSIS — N183 Chronic kidney disease, stage 3 (moderate): Secondary | ICD-10-CM | POA: Diagnosis not present

## 2017-11-30 DIAGNOSIS — E1165 Type 2 diabetes mellitus with hyperglycemia: Secondary | ICD-10-CM | POA: Diagnosis not present

## 2017-12-07 ENCOUNTER — Ambulatory Visit (INDEPENDENT_AMBULATORY_CARE_PROVIDER_SITE_OTHER): Payer: Medicare Other | Admitting: Cardiology

## 2017-12-07 ENCOUNTER — Encounter: Payer: Self-pay | Admitting: Cardiology

## 2017-12-07 VITALS — BP 118/62 | HR 71 | Ht 69.0 in | Wt 204.8 lb

## 2017-12-07 DIAGNOSIS — N289 Disorder of kidney and ureter, unspecified: Secondary | ICD-10-CM | POA: Diagnosis not present

## 2017-12-07 DIAGNOSIS — E782 Mixed hyperlipidemia: Secondary | ICD-10-CM

## 2017-12-07 DIAGNOSIS — I25709 Atherosclerosis of coronary artery bypass graft(s), unspecified, with unspecified angina pectoris: Secondary | ICD-10-CM

## 2017-12-07 DIAGNOSIS — I10 Essential (primary) hypertension: Secondary | ICD-10-CM | POA: Diagnosis not present

## 2017-12-07 DIAGNOSIS — I209 Angina pectoris, unspecified: Secondary | ICD-10-CM | POA: Diagnosis not present

## 2017-12-07 DIAGNOSIS — E088 Diabetes mellitus due to underlying condition with unspecified complications: Secondary | ICD-10-CM

## 2017-12-07 DIAGNOSIS — E6609 Other obesity due to excess calories: Secondary | ICD-10-CM

## 2017-12-07 MED ORDER — EZETIMIBE 10 MG PO TABS: 10.0000 mg | ORAL_TABLET | Freq: Every day | ORAL | 2 refills | Status: DC

## 2017-12-07 NOTE — Progress Notes (Signed)
Cardiology Office Note:    Date:  12/07/2017   ID:  LACHLAN MCKIM, DOB 19-Apr-1946, MRN [id number]  PCP:  Mateo Flow, MD  Cardiologist:  Jenean Lindau, MD   Referring MD: Mateo Flow, MD    ASSESSMENT:    1. Coronary artery disease involving coronary bypass graft of native heart with angina pectoris ([place])   2. Mixed dyslipidemia   3. Angina pectoris ([place])   4. Essential hypertension   5. 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 Mild renal insufficiency   7. Non morbid obesity due to excess calories    PLAN:    In order of problems listed above:  1. Secondary prevention stressed with the patient.  Importance of compliance with diet and medication stressed and he vocalized understanding.  His blood pressure is stable.  I advised him about exercise and is important in reducing weight and the risks of obesity.  He vocalized understanding. 2. His lipids are fine but not optimal and therefore I would like to add Zetia 10 mg daily to his regimen.  He will be having blood work done in 6 weeks and will send the reports to [place] and will call us to get advice on the reports. 3. Patient will be seen in follow-up appointment in 6 months or earlier if the patient has any concerns    Medication Adjustments/Labs and Tests Ordered: Current medicines are reviewed at length with the patient today.  Concerns regarding medicines are outlined above.  Orders Placed This Encounter  Procedures  . Hepatic function panel  . Lipid panel   Meds ordered this encounter  Medications  . ezetimibe (ZETIA) 10 MG tablet    Sig: Take 1 tablet (10 mg total) by mouth daily.    Dispense:  90 tablet    Refill:  2     Chief Complaint  Patient presents with  . Follow-up  . Coronary Artery Disease     History of Present Illness:    RUBIN DAIS is a 72 y.o. male.  The patient has known coronary artery disease,  diabetes mellitus, essential hypertension and dyslipidemia.  He denies any problems at this time and takes care of activities of daily living.  No chest pain orthopnea or PND.  He leads a sedentary lifestyle.  He occasionally has an episode of chest tightness relieved with nitroglycerin suggestive of stable angina probably happening once in a several month..  Past Medical History:  Diagnosis Date  . CAD (coronary artery disease)   . Chest tightness   . Dyslipidemia     History reviewed. No pertinent surgical history.  Current Medications: Current Meds  Medication Sig  . aspirin EC 81 MG tablet Take 81 mg by mouth daily.   . clopidogrel (PLAVIX) 75 MG tablet Take 1 tablet (75 mg total) by mouth daily.  . Coenzyme Q-10 200 MG CAPS Take 200 mg by mouth daily.   . Dulaglutide (TRULICITY [place]) Inject into the skin.  . fenofibrate 160 MG tablet Take 1 tablet (160 mg total) by mouth daily.  Marland Kitchen glimepiride (AMARYL) 2 MG tablet Take 2 mg by mouth daily with breakfast.   . isosorbide mononitrate (IMDUR) 30 MG 24 hr tablet Take 1 tablet (30 mg total) by mouth daily.  Marland Kitchen linagliptin (TRADJENTA) 5 MG TABS tablet Take 5 mg by mouth daily.   Marland Kitchen losartan (COZAAR) 50 MG tablet TAKE 50 mg twice daily  .  metoprolol succinate (TOPROL-XL) 25 MG 24 hr tablet Take 1 tablet (25 mg total) by mouth daily.  . nitroGLYCERIN (NITROSTAT) 0.4 MG SL tablet Place 1 tablet (0.4 mg total) under the tongue every 5 (five) minutes as needed.  . pantoprazole (PROTONIX) 40 MG tablet Take 40 mg by mouth daily.   . Pitavastatin Calcium (LIVALO) 4 MG TABS Take 1 tablet (4 mg total) by mouth daily.  . ranolazine (RANEXA) 1000 MG SR tablet Take 1 tablet (1,000 mg total) by mouth 2 (two) times daily. For 1 week. Then begin taking 1,000mg  two times daily.     Allergies:   Morphine; Ramipril; Rosuvastatin; and Ticlopidine hcl   Social History   Socioeconomic History  . Marital status: Married    Spouse name: Not on file  . Number  of children: Not on file  . Years of education: Not on file  . Highest education level: Not on file  Occupational History  . Not on file  Social Needs  . Financial resource strain: Not on file  . Food insecurity:    Worry: Not on file    Inability: Not on file  . Transportation needs:    Medical: Not on file    Non-medical: Not on file  Tobacco Use  . Smoking status: Former Research scientist (life sciences)  . Smokeless tobacco: Never Used  Substance and Sexual Activity  . Alcohol use: No  . Drug use: No  . Sexual activity: Not on file  Lifestyle  . Physical activity:    Days per week: Not on file    Minutes per session: Not on file  . Stress: Not on file  Relationships  . Social connections:    Talks on phone: Not on file    Gets together: Not on file    Attends religious service: Not on file    Active member of club or organization: Not on file    Attends meetings of clubs or organizations: Not on file    Relationship status: Not on file  Other Topics Concern  . Not on file  Social History Narrative  . Not on file     Family History: The patient's family history includes Heart attack in his father; Heart disease in his mother; Heart failure in his mother; Stroke in his father.  ROS:   Please see the history of present illness.    All other systems reviewed and are negative.  EKGs/Labs/Other Studies Reviewed:    The following studies were reviewed today: I discussed lipid that he brought along with done at primary care physician office.   Recent Labs: No results found for requested labs within last 8760 hours.  Recent Lipid Panel    Component Value Date/Time   CHOL  09/08/2010 0415    154        ATP III CLASSIFICATION:  <200     mg/dL   Desirable  200-239  mg/dL   Borderline High  >=240    mg/dL   High          TRIG 461 (H) 09/08/2010 0415   HDL 29 (L) 09/08/2010 0415   CHOLHDL 5.3 09/08/2010 0415   VLDL UNABLE TO CALCULATE IF TRIGLYCERIDE OVER 400 mg/dL 09/08/2010 0415    LDLCALC  09/08/2010 0415    UNABLE TO CALCULATE IF TRIGLYCERIDE OVER 400 mg/dL        Total Cholesterol/HDL:CHD Risk Coronary Heart Disease Risk Table  Men   Women  1/2 Average Risk   3.4   3.3  Average Risk       5.0   4.4  2 X Average Risk   9.6   7.1  3 X Average Risk  23.4   11.0        Use the calculated Patient Ratio above and the CHD Risk Table to determine the patient's CHD Risk.        ATP III CLASSIFICATION (LDL):  <100     mg/dL   Optimal  100-129  mg/dL   Near or Above                    Optimal  130-159  mg/dL   Borderline  160-189  mg/dL   High  >190     mg/dL   Very High    Physical Exam:    VS:  BP 118/62 (BP Location: Left Arm, Patient Position: Sitting, Cuff Size: Normal)   Pulse 71   Ht 5\' 9"  (1.753 m)   Wt 204 lb 12.8 oz (92.9 kg)   SpO2 98%   BMI 30.24 kg/m     Wt Readings from Last 3 Encounters:  12/07/17 204 lb 12.8 oz (92.9 kg)  05/24/17 207 lb 1.3 oz (93.9 kg)  02/23/17 203 lb (92.1 kg)     GEN: Patient is in no acute distress HEENT: Normal NECK: No JVD; No carotid bruits LYMPHATICS: No lymphadenopathy CARDIAC: Hear sounds regular, 2/6 systolic murmur at the apex. RESPIRATORY:  Clear to auscultation without rales, wheezing or rhonchi  ABDOMEN: Soft, non-tender, non-distended MUSCULOSKELETAL:  No edema; No deformity  SKIN: Warm and dry NEUROLOGIC:  Alert and oriented x 3 PSYCHIATRIC:  Normal affect   Signed, Jenean Lindau, MD  12/07/2017 11:33 AM    [place]

## 2017-12-07 NOTE — Patient Instructions (Signed)
Medication Instructions:  Your physician has recommended you make the following change in your medication:  START zetia 10 mg daily  Labwork: Your physician recommends that you have the following labs drawn: liver and lipid panel  Testing/Procedures: None  Follow-Up: Your physician recommends that you schedule a follow-up appointment in: 6 months  Any Other Special Instructions Will Be Listed Below (If Applicable).     If you need a refill on your cardiac medications before your next appointment, please call your pharmacy.   [place], RN, BSN   Ezetimibe Tablets What is this medicine? EZETIMIBE (ez ET i mibe) blocks the absorption of cholesterol from the stomach. It can help lower blood cholesterol for patients who are at risk of getting heart disease or a stroke. It is only for patients whose cholesterol level is not controlled by diet. This medicine may be used for other purposes; ask your health care provider or pharmacist if you have questions. COMMON BRAND NAME(S): Zetia What should I tell my health care provider before I take this medicine? They need to know if you have any of these conditions: -liver disease -an unusual or allergic reaction to ezetimibe, medicines, foods, dyes, or preservatives -pregnant or trying to get pregnant -breast-feeding How should I use this medicine? Take this medicine by mouth with a glass of water. Follow the directions on the prescription label. This medicine can be taken with or without food. Take your doses at regular intervals. Do not take your medicine more often than directed. Talk to your pediatrician regarding the use of this medicine in children. Special care may be needed. Overdosage: If you think you have taken too much of this medicine contact a poison control center or emergency room at once. NOTE: This medicine is only for you. Do not share this medicine with others. What if I miss a dose? If you miss a  dose, take it as soon as you can. If it is almost time for your next dose, take only that dose. Do not take double or extra doses. What may interact with this medicine? Do not take this medicine with any of the following medications: -fenofibrate -gemfibrozil This medicine may also interact with the following medications: -antacids -cyclosporine -herbal medicines like red yeast rice -other medicines to lower cholesterol or triglycerides This list may not describe all possible interactions. Give your health care provider a list of all the medicines, herbs, non-prescription drugs, or dietary supplements you use. Also tell them if you smoke, drink alcohol, or use illegal drugs. Some items may interact with your medicine. What should I watch for while using this medicine? Visit your doctor or health care professional for regular checks on your progress. You will need to have your cholesterol levels checked. If you are also taking some other cholesterol medicines, you will also need to have tests to make sure your liver is working properly. Tell your doctor or health care professional if you get any unexplained muscle pain, tenderness, or weakness, especially if you also have a fever and tiredness. You need to follow a low-cholesterol, low-fat diet while you are taking this medicine. This will decrease your risk of getting heart and blood vessel disease. Exercising and avoiding alcohol and smoking can also help. Ask your doctor or dietician for advice. What side effects may I notice from receiving this medicine? Side effects that you should report to your doctor or health care professional as soon as possible: -allergic reactions like skin rash,  itching or hives, swelling of the face, lips, or tongue -dark yellow or brown urine -unusually weak or tired -yellowing of the skin or eyes Side effects that usually do not require medical attention (report to your doctor or health care professional if they  continue or are bothersome): -diarrhea -dizziness -headache -stomach upset or pain This list may not describe all possible side effects. Call your doctor for medical advice about side effects. You may report side effects to FDA at 1-800-FDA-1088. Where should I keep my medicine? Keep out of the reach of children. Store at room temperature between 15 and 30 degrees C (59 and 86 degrees F). Protect from moisture. Keep container tightly closed. Throw away any unused medicine after the expiration date. NOTE: This sheet is a summary. It may not cover all possible information. If you have questions about this medicine, talk to your doctor, pharmacist, or health care provider.  2018 Elsevier/Gold Standard (2012-01-02 15:39:09)

## 2018-01-12 ENCOUNTER — Other Ambulatory Visit: Payer: Self-pay

## 2018-01-12 MED ORDER — PITAVASTATIN CALCIUM 4 MG PO TABS: 1.0000 | ORAL_TABLET | Freq: Every day | ORAL | 2 refills | Status: DC

## 2018-01-24 DIAGNOSIS — N401 Enlarged prostate with lower urinary tract symptoms: Secondary | ICD-10-CM | POA: Diagnosis not present

## 2018-01-24 DIAGNOSIS — R3912 Poor urinary stream: Secondary | ICD-10-CM | POA: Diagnosis not present

## 2018-01-25 DIAGNOSIS — H9319 Tinnitus, unspecified ear: Secondary | ICD-10-CM | POA: Diagnosis not present

## 2018-01-25 DIAGNOSIS — J342 Deviated nasal septum: Secondary | ICD-10-CM | POA: Diagnosis not present

## 2018-01-25 DIAGNOSIS — H903 Sensorineural hearing loss, bilateral: Secondary | ICD-10-CM | POA: Diagnosis not present

## 2018-01-25 DIAGNOSIS — Z77122 Contact with and (suspected) exposure to noise: Secondary | ICD-10-CM | POA: Diagnosis not present

## 2018-02-08 ENCOUNTER — Telehealth: Payer: Self-pay | Admitting: Cardiology

## 2018-02-08 NOTE — Telephone Encounter (Signed)
Glenn Roach Self  [phone]  [facility]  metoprolol succinate (TOPROL-XL) 25 MG 24 hr tablet/fenofibrate 160 MG tablet/isosorbide mononitrate (IMDUR) 30 MG 24 hr tablet   90 Day supply  [name] called and said he needs a 90 day supply on the above listed medicines and is having trouble getting them filled. I gave him [place] phone number and let him know I would type up request, he also stated that the pharmacy said they had sent in several request.

## 2018-02-09 ENCOUNTER — Other Ambulatory Visit: Payer: Self-pay

## 2018-02-09 MED ORDER — METOPROLOL SUCCINATE ER 25 MG PO TB24: 25.0000 mg | ORAL_TABLET | Freq: Every day | ORAL | 1 refills | Status: DC

## 2018-02-09 MED ORDER — FENOFIBRATE 160 MG PO TABS: 160.0000 mg | ORAL_TABLET | Freq: Every day | ORAL | 1 refills | Status: DC

## 2018-02-09 MED ORDER — ISOSORBIDE MONONITRATE ER 30 MG PO TB24: 30.0000 mg | ORAL_TABLET | Freq: Every day | ORAL | 1 refills | Status: DC

## 2018-02-15 ENCOUNTER — Other Ambulatory Visit: Payer: Self-pay

## 2018-02-15 MED ORDER — ISOSORBIDE MONONITRATE ER 30 MG PO TB24: 30.0000 mg | ORAL_TABLET | Freq: Every day | ORAL | 1 refills | Status: DC

## 2018-02-15 MED ORDER — FENOFIBRATE 160 MG PO TABS: 160.0000 mg | ORAL_TABLET | Freq: Every day | ORAL | 1 refills | Status: DC

## 2018-02-15 MED ORDER — METOPROLOL SUCCINATE ER 25 MG PO TB24: 25.0000 mg | ORAL_TABLET | Freq: Every day | ORAL | 1 refills | Status: DC

## 2018-03-28 DIAGNOSIS — E782 Mixed hyperlipidemia: Secondary | ICD-10-CM | POA: Diagnosis not present

## 2018-03-28 DIAGNOSIS — I25709 Atherosclerosis of coronary artery bypass graft(s), unspecified, with unspecified angina pectoris: Secondary | ICD-10-CM | POA: Diagnosis not present

## 2018-03-29 LAB — HEPATIC FUNCTION PANEL
ALK PHOS: 44 IU/L (ref 39–117)
ALT: 14 IU/L (ref 0–44)
AST: 14 IU/L (ref 0–40)
Albumin: 4.6 g/dL (ref 3.5–4.8)
Bilirubin Total: 0.7 mg/dL (ref 0.0–1.2)
Bilirubin, Direct: 0.2 mg/dL (ref 0.00–0.40)
TOTAL PROTEIN: 6.5 g/dL (ref 6.0–8.5)

## 2018-03-29 LAB — LIPID PANEL
CHOLESTEROL TOTAL: 129 mg/dL (ref 100–199)
Chol/HDL Ratio: 2.9 ratio (ref 0.0–5.0)
HDL: 44 mg/dL (ref >39)
LDL Calculated: 60 mg/dL (ref 0–99)
TRIGLYCERIDES: 124 mg/dL (ref 0–149)
VLDL Cholesterol Cal: 25 mg/dL (ref 5–40)

## 2018-04-04 ENCOUNTER — Other Ambulatory Visit: Payer: Self-pay | Admitting: Emergency Medicine

## 2018-04-04 ENCOUNTER — Telehealth: Payer: Self-pay | Admitting: Emergency Medicine

## 2018-04-04 ENCOUNTER — Other Ambulatory Visit: Payer: Self-pay

## 2018-04-04 MED ORDER — CLOPIDOGREL BISULFATE 75 MG PO TABS: 75.0000 mg | ORAL_TABLET | Freq: Every day | ORAL | 1 refills | Status: DC

## 2018-04-04 MED ORDER — RANOLAZINE ER 1000 MG PO TB12: 1000.0000 mg | ORAL_TABLET | Freq: Two times a day (BID) | ORAL | 0 refills | Status: DC

## 2018-04-04 NOTE — Telephone Encounter (Signed)
Patient reports had been taking plavix 75 mg for a long time and has never had any reaction.

## 2018-04-04 NOTE — Telephone Encounter (Signed)
Left message for patient to return call regarding allergy before plavix refill.

## 2018-04-18 DIAGNOSIS — Z23 Encounter for immunization: Secondary | ICD-10-CM | POA: Diagnosis not present

## 2018-05-02 DIAGNOSIS — N401 Enlarged prostate with lower urinary tract symptoms: Secondary | ICD-10-CM | POA: Diagnosis not present

## 2018-05-02 DIAGNOSIS — R3912 Poor urinary stream: Secondary | ICD-10-CM | POA: Diagnosis not present

## 2018-05-08 ENCOUNTER — Other Ambulatory Visit: Payer: Self-pay

## 2018-05-08 DIAGNOSIS — D1801 Hemangioma of skin and subcutaneous tissue: Secondary | ICD-10-CM | POA: Diagnosis not present

## 2018-05-08 DIAGNOSIS — D485 Neoplasm of uncertain behavior of skin: Secondary | ICD-10-CM | POA: Diagnosis not present

## 2018-05-08 DIAGNOSIS — L570 Actinic keratosis: Secondary | ICD-10-CM | POA: Diagnosis not present

## 2018-05-08 DIAGNOSIS — C44519 Basal cell carcinoma of skin of other part of trunk: Secondary | ICD-10-CM | POA: Diagnosis not present

## 2018-05-08 DIAGNOSIS — Z85828 Personal history of other malignant neoplasm of skin: Secondary | ICD-10-CM | POA: Diagnosis not present

## 2018-05-08 DIAGNOSIS — C44619 Basal cell carcinoma of skin of left upper limb, including shoulder: Secondary | ICD-10-CM | POA: Diagnosis not present

## 2018-05-08 DIAGNOSIS — D044 Carcinoma in situ of skin of scalp and neck: Secondary | ICD-10-CM | POA: Diagnosis not present

## 2018-05-08 MED ORDER — RANOLAZINE ER 1000 MG PO TB12: 1000.0000 mg | ORAL_TABLET | Freq: Two times a day (BID) | ORAL | 0 refills | Status: DC

## 2018-06-11 ENCOUNTER — Telehealth: Payer: Self-pay | Admitting: Cardiology

## 2018-06-11 ENCOUNTER — Other Ambulatory Visit: Payer: Self-pay | Admitting: *Deleted

## 2018-06-11 MED ORDER — RANOLAZINE ER 1000 MG PO TB12: 1000.0000 mg | ORAL_TABLET | Freq: Two times a day (BID) | ORAL | 0 refills | Status: DC

## 2018-06-11 NOTE — Telephone Encounter (Signed)
Authorized 60 days no refill until patient has f/u this month.

## 2018-06-11 NOTE — Telephone Encounter (Signed)
Please call regarding some prescriptions

## 2018-06-11 NOTE — Telephone Encounter (Signed)
Ranolazine refilled to get patient to follow up appointment on 06/18/18. Further refills will be provided during appointment.

## 2018-06-18 ENCOUNTER — Encounter: Payer: Self-pay | Admitting: Cardiology

## 2018-06-18 ENCOUNTER — Ambulatory Visit (INDEPENDENT_AMBULATORY_CARE_PROVIDER_SITE_OTHER): Payer: Medicare Other | Admitting: Cardiology

## 2018-06-18 VITALS — BP 126/64 | HR 68 | Ht 69.0 in | Wt 200.8 lb

## 2018-06-18 DIAGNOSIS — Z1329 Encounter for screening for other suspected endocrine disorder: Secondary | ICD-10-CM | POA: Diagnosis not present

## 2018-06-18 DIAGNOSIS — E088 Diabetes mellitus due to underlying condition with unspecified complications: Secondary | ICD-10-CM

## 2018-06-18 DIAGNOSIS — E782 Mixed hyperlipidemia: Secondary | ICD-10-CM | POA: Diagnosis not present

## 2018-06-18 DIAGNOSIS — I10 Essential (primary) hypertension: Secondary | ICD-10-CM

## 2018-06-18 DIAGNOSIS — I25709 Atherosclerosis of coronary artery bypass graft(s), unspecified, with unspecified angina pectoris: Secondary | ICD-10-CM

## 2018-06-18 NOTE — Progress Notes (Signed)
Cardiology Office Note:    Date:  06/18/2018   ID:  Glenn Roach, DOB 01-Sep-1945, MRN [id number]  PCP:  Mateo Flow, MD  Cardiologist:  Jenean Lindau, MD   Referring MD: Mateo Flow, MD    ASSESSMENT:    No diagnosis found. PLAN:    In order of problems listed above:  1. Secondary prevention stressed with the patient.  Importance of compliance with diet and medication stressed.  He vocalized understanding.  His blood pressure is stable.  He will be back in a month for a liver lipid check after either continuing or stopping Zetia depending on how he feels 2. Patient will be seen in follow-up appointment in 6 months or earlier if the patient has any concerns    Medication Adjustments/Labs and Tests Ordered: Current medicines are reviewed at length with the patient today.  Concerns regarding medicines are outlined above.  No orders of the defined types were placed in this encounter.  No orders of the defined types were placed in this encounter.    Chief Complaint  Patient presents with  . Follow-up     History of Present Illness:    Glenn Roach is a 72 y.o. male.  Patient has past medical history of coronary artery disease, essential hypertension and dyslipidemia.  He denies any problems at this time and takes care of activities of daily living.  No chest pain orthopnea or PND.  He is walking on a regular basis.  He tells me that he has some gastroenterology issues.  Therefore he stopped his Zetia.  These issues have resolved and now he wants to be initiated to see if Zetia was the cause of this symptoms.  Past Medical History:  Diagnosis Date  . CAD (coronary artery disease)   . Chest tightness   . Dyslipidemia     No past surgical history on file.  Current Medications: Current Meds  Medication Sig  . aspirin EC 81 MG tablet Take 81 mg by mouth daily.   . clopidogrel (PLAVIX) 75 MG tablet Take 1 tablet (75 mg total) by mouth daily.  . Coenzyme Q-10  200 MG CAPS Take 200 mg by mouth daily.   . Dulaglutide (TRULICITY [place]) Inject into the skin.  . fenofibrate 160 MG tablet Take 1 tablet (160 mg total) by mouth daily.  Marland Kitchen glimepiride (AMARYL) 2 MG tablet Take 2 mg by mouth daily with breakfast.   . isosorbide mononitrate (IMDUR) 30 MG 24 hr tablet Take 1 tablet (30 mg total) by mouth daily.  Marland Kitchen linagliptin (TRADJENTA) 5 MG TABS tablet Take 5 mg by mouth daily.   Marland Kitchen losartan (COZAAR) 50 MG tablet TAKE 50 mg twice daily  . metoprolol succinate (TOPROL-XL) 25 MG 24 hr tablet Take 1 tablet (25 mg total) by mouth daily.  . nitroGLYCERIN (NITROSTAT) 0.4 MG SL tablet Place 1 tablet (0.4 mg total) under the tongue every 5 (five) minutes as needed.  . pantoprazole (PROTONIX) 40 MG tablet Take 40 mg by mouth daily.   . Pitavastatin Calcium (LIVALO) 4 MG TABS Take 1 tablet (4 mg total) by mouth daily.  . ranolazine (RANEXA) 1000 MG SR tablet Take 1 tablet (1,000 mg total) by mouth 2 (two) times daily.     Allergies:   Morphine; Ramipril; Rosuvastatin; and Ticlopidine hcl   Social History   Socioeconomic History  . Marital status: Married    Spouse name: Not on file  . Number of children: Not on  file  . Years of education: Not on file  . Highest education level: Not on file  Occupational History  . Not on file  Social Needs  . Financial resource strain: Not on file  . Food insecurity:    Worry: Not on file    Inability: Not on file  . Transportation needs:    Medical: Not on file    Non-medical: Not on file  Tobacco Use  . Smoking status: Former Research scientist (life sciences)  . Smokeless tobacco: Never Used  Substance and Sexual Activity  . Alcohol use: No  . Drug use: No  . Sexual activity: Not on file  Lifestyle  . Physical activity:    Days per week: Not on file    Minutes per session: Not on file  . Stress: Not on file  Relationships  . Social connections:    Talks on phone: Not on file    Gets together: Not on file    Attends religious service: Not  on file    Active member of club or organization: Not on file    Attends meetings of clubs or organizations: Not on file    Relationship status: Not on file  Other Topics Concern  . Not on file  Social History Narrative  . Not on file     Family History: The patient's family history includes Heart attack in his father; Heart disease in his mother; Heart failure in his mother; Stroke in his father.  ROS:   Please see the history of present illness.    All other systems reviewed and are negative.  EKGs/Labs/Other Studies Reviewed:    The following studies were reviewed today: I discussed my findings with the patient and   Recent Labs: 03/28/2018: ALT 14  Recent Lipid Panel    Component Value Date/Time   CHOL 129 03/28/2018 1039   TRIG 124 03/28/2018 1039   HDL 44 03/28/2018 1039   CHOLHDL 2.9 03/28/2018 1039   CHOLHDL 5.3 09/08/2010 0415   VLDL UNABLE TO CALCULATE IF TRIGLYCERIDE OVER 400 mg/dL 09/08/2010 0415   LDLCALC 60 03/28/2018 1039    Physical Exam:    VS:  BP 126/64   Pulse 68   Ht 5\' 9"  (1.753 m)   Wt 200 lb 12.8 oz (91.1 kg)   SpO2 98%   BMI 29.65 kg/m     Wt Readings from Last 3 Encounters:  06/18/18 200 lb 12.8 oz (91.1 kg)  12/07/17 204 lb 12.8 oz (92.9 kg)  05/24/17 207 lb 1.3 oz (93.9 kg)     GEN: Patient is in no acute distress HEENT: Normal NECK: No JVD; No carotid bruits LYMPHATICS: No lymphadenopathy CARDIAC: Hear sounds regular, 2/6 systolic murmur at the apex. RESPIRATORY:  Clear to auscultation without rales, wheezing or rhonchi  ABDOMEN: Soft, non-tender, non-distended MUSCULOSKELETAL:  No edema; No deformity  SKIN: Warm and dry NEUROLOGIC:  Alert and oriented x 3 PSYCHIATRIC:  Normal affect   Signed, Jenean Lindau, MD  06/18/2018 11:27 AM    [place]

## 2018-06-18 NOTE — Patient Instructions (Signed)
Medication Instructions:  Your physician recommends that you continue on your current medications as directed. Please refer to the Current Medication list given to you today.  If you need a refill on your cardiac medications before your next appointment, please call your pharmacy.   Lab work: Your physician recommends that you have the following labs drawn: BMP, CBC, TSH, liver and lipid panel in 6 weeks. No appointment needed for labs, please come fasting.  If you have labs (blood work) drawn today and your tests are completely normal, you will receive your results only by: Marland Kitchen MyChart Message (if you have MyChart) OR . A paper copy in the mail If you have any lab test that is abnormal or we need to change your treatment, we will call you to review the results.  Testing/Procedures: None  Follow-Up: At [facility], you and your health needs are our priority.  As part of our continuing mission to provide you with exceptional heart care, we have created designated Provider Care Teams.  These Care Teams include your primary Cardiologist (physician) and Advanced Practice Providers (APPs -  Physician Assistants and Nurse Practitioners) who all work together to provide you with the care you need, when you need it.  You will need a follow up appointment in 6 months.  Please call our office 2 months in advance to schedule this appointment.  You may see another member of our [employer] Provider Team in [place]: Jenne Campus, MD . Shirlee More, MD  Any Other Special Instructions Will Be Listed Below (If Applicable).

## 2018-07-12 ENCOUNTER — Other Ambulatory Visit: Payer: Self-pay | Admitting: Emergency Medicine

## 2018-07-12 MED ORDER — RANOLAZINE ER 1000 MG PO TB12: 1000.0000 mg | ORAL_TABLET | Freq: Two times a day (BID) | ORAL | 1 refills | Status: DC

## 2018-07-19 DIAGNOSIS — Z85828 Personal history of other malignant neoplasm of skin: Secondary | ICD-10-CM | POA: Diagnosis not present

## 2018-07-19 DIAGNOSIS — D044 Carcinoma in situ of skin of scalp and neck: Secondary | ICD-10-CM | POA: Diagnosis not present

## 2018-09-17 ENCOUNTER — Other Ambulatory Visit: Payer: Self-pay | Admitting: *Deleted

## 2018-09-17 MED ORDER — EZETIMIBE 10 MG PO TABS: 10.0000 mg | ORAL_TABLET | Freq: Every day | ORAL | 1 refills | Status: DC

## 2018-09-17 MED ORDER — CLOPIDOGREL BISULFATE 75 MG PO TABS: 75.0000 mg | ORAL_TABLET | Freq: Every day | ORAL | 1 refills | Status: DC

## 2018-09-17 NOTE — Telephone Encounter (Signed)
90 day and 1 refill clopidogrel and zetia sent to patient requested patient pharmacy.

## 2018-09-17 NOTE — Telephone Encounter (Signed)
*  STAT* If patient is at the pharmacy, call can be transferred to refill team.   1. Which medications need to be refilled? (please list name of each medication and dose if known) Clopidogrel 75 mg qd and Ezetimibe 10 mg qd  2. Which pharmacy/location (including street and city if local pharmacy) is medication to be sent to?[facility]  3. Do they need a 30 day or 90 day supply? [place]

## 2018-09-20 ENCOUNTER — Other Ambulatory Visit: Payer: Self-pay

## 2018-09-20 DIAGNOSIS — E088 Diabetes mellitus due to underlying condition with unspecified complications: Secondary | ICD-10-CM | POA: Diagnosis not present

## 2018-09-20 DIAGNOSIS — E782 Mixed hyperlipidemia: Secondary | ICD-10-CM | POA: Diagnosis not present

## 2018-09-20 DIAGNOSIS — I25709 Atherosclerosis of coronary artery bypass graft(s), unspecified, with unspecified angina pectoris: Secondary | ICD-10-CM | POA: Diagnosis not present

## 2018-09-20 DIAGNOSIS — Z1329 Encounter for screening for other suspected endocrine disorder: Secondary | ICD-10-CM | POA: Diagnosis not present

## 2018-09-20 LAB — BASIC METABOLIC PANEL
BUN / CREAT RATIO: 13 (ref 10–24)
BUN: 19 mg/dL (ref 8–27)
CO2: 21 mmol/L (ref 20–29)
Calcium: 9.6 mg/dL (ref 8.6–10.2)
Chloride: 99 mmol/L (ref 96–106)
Creatinine, Ser: 1.5 mg/dL — ABNORMAL HIGH (ref 0.76–1.27)
GFR, EST AFRICAN AMERICAN: 52 mL/min/{1.73_m2} — AB (ref >59)
GFR, EST NON AFRICAN AMERICAN: 45 mL/min/{1.73_m2} — AB (ref >59)
Glucose: 116 mg/dL — ABNORMAL HIGH (ref 65–99)
Potassium: 4.7 mmol/L (ref 3.5–5.2)
Sodium: 138 mmol/L (ref 134–144)

## 2018-09-20 LAB — LIPID PANEL
Chol/HDL Ratio: 2.9 ratio (ref 0.0–5.0)
Cholesterol, Total: 129 mg/dL (ref 100–199)
HDL: 44 mg/dL (ref >39)
LDL Calculated: 58 mg/dL (ref 0–99)
Triglycerides: 137 mg/dL (ref 0–149)
VLDL Cholesterol Cal: 27 mg/dL (ref 5–40)

## 2018-09-20 LAB — CBC WITH DIFFERENTIAL/PLATELET
BASOS: 1 %
Basophils Absolute: 0.1 10*3/uL (ref 0.0–0.2)
EOS (ABSOLUTE): 0.1 10*3/uL (ref 0.0–0.4)
EOS: 2 %
HEMATOCRIT: 46.1 % (ref 37.5–51.0)
HEMOGLOBIN: 15.6 g/dL (ref 13.0–17.7)
Immature Grans (Abs): 0 10*3/uL (ref 0.0–0.1)
Immature Granulocytes: 1 %
LYMPHS ABS: 0.6 10*3/uL — AB (ref 0.7–3.1)
Lymphs: 14 %
MCH: 30.8 pg (ref 26.6–33.0)
MCHC: 33.8 g/dL (ref 31.5–35.7)
MCV: 91 fL (ref 79–97)
MONOCYTES: 9 %
Monocytes Absolute: 0.4 10*3/uL (ref 0.1–0.9)
NEUTROS ABS: 3.1 10*3/uL (ref 1.4–7.0)
Neutrophils: 73 %
Platelets: 168 10*3/uL (ref 150–450)
RBC: 5.07 x10E6/uL (ref 4.14–5.80)
RDW: 12.4 % (ref 11.6–15.4)
WBC: 4.2 10*3/uL (ref 3.4–10.8)

## 2018-09-20 LAB — HEPATIC FUNCTION PANEL
ALT: 15 IU/L (ref 0–44)
AST: 16 IU/L (ref 0–40)
Albumin: 4.5 g/dL (ref 3.7–4.7)
Alkaline Phosphatase: 44 IU/L (ref 39–117)
Bilirubin Total: 0.7 mg/dL (ref 0.0–1.2)
Bilirubin, Direct: 0.28 mg/dL (ref 0.00–0.40)
Total Protein: 6.5 g/dL (ref 6.0–8.5)

## 2018-09-20 LAB — TSH: TSH: 1.96 u[IU]/mL (ref 0.450–4.500)

## 2018-09-20 NOTE — Progress Notes (Signed)
Patient came in for labs requesting additional A1C be drawn. Dr.RRR verbally requested order.

## 2018-09-21 ENCOUNTER — Other Ambulatory Visit: Payer: Self-pay

## 2018-09-21 LAB — HEMOGLOBIN A1C
Est. average glucose Bld gHb Est-mCnc: 148 mg/dL
Hgb A1c MFr Bld: 6.8 % — ABNORMAL HIGH (ref 4.8–5.6)

## 2018-09-21 MED ORDER — PITAVASTATIN CALCIUM 4 MG PO TABS: 1.0000 | ORAL_TABLET | Freq: Every day | ORAL | 1 refills | Status: DC

## 2018-09-25 ENCOUNTER — Telehealth: Payer: Self-pay

## 2018-09-25 DIAGNOSIS — F5102 Adjustment insomnia: Secondary | ICD-10-CM | POA: Diagnosis not present

## 2018-09-25 DIAGNOSIS — E1165 Type 2 diabetes mellitus with hyperglycemia: Secondary | ICD-10-CM | POA: Diagnosis not present

## 2018-09-25 DIAGNOSIS — N183 Chronic kidney disease, stage 3 (moderate): Secondary | ICD-10-CM | POA: Diagnosis not present

## 2018-09-25 DIAGNOSIS — E782 Mixed hyperlipidemia: Secondary | ICD-10-CM | POA: Diagnosis not present

## 2018-09-25 NOTE — Telephone Encounter (Signed)
Patient results relayed and copy of results sent to Dr.J.Khan, no further questions at this time.

## 2018-12-20 ENCOUNTER — Telehealth: Payer: Self-pay | Admitting: *Deleted

## 2018-12-20 NOTE — Telephone Encounter (Signed)
YOUR CARDIOLOGY TEAM HAS ARRANGED FOR AN E-VISIT FOR YOUR APPOINTMENT - PLEASE REVIEW IMPORTANT INFORMATION BELOW SEVERAL DAYS PRIOR TO YOUR APPOINTMENT  Due to the recent COVID-19 pandemic, we are transitioning in-person office visits to tele-medicine visits in an effort to decrease unnecessary exposure to our patients, their families, and staff. These visits are billed to your insurance just like a normal visit is. We also encourage you to sign up for MyChart if you have not already done so. You will need a smartphone if possible. For patients that do not have this, we can still complete the visit using a regular telephone but do prefer a smartphone to enable video when possible. You may have a family member that lives with you that can help. If possible, we also ask that you have a blood pressure cuff and scale at home to measure your blood pressure, heart rate and weight prior to your scheduled appointment. Patients with clinical needs that need an in-person evaluation and testing will still be able to come to the office if absolutely necessary. If you have any questions, feel free to call our office.     YOUR PROVIDER WILL BE USING THE FOLLOWING PLATFORM TO COMPLETE YOUR VISIT: Staff: Please delete this text and fill in MyChart/[email or web address]  . IF USING MYCHART - How to Download the MyChart App to Your SmartPhone   - If Apple, go to App Store and type in MyChart in the search bar and download the app. If Android, ask patient to go to Google Play Store and type in MyChart in the search bar and download the app. The app is free but as with any other app downloads, your phone may require you to verify saved payment information or Apple/Android password.  - You will need to then log into the app with your MyChart username and password, and select [facility] as your healthcare provider to link the account.  - When it is time for your visit, go to the MyChart app, find appointments, and click Begin  Video Visit. Be sure to Select Allow for your device to access the Microphone and Camera for your visit. You will then be connected, and your provider will be with you shortly.  **If you have any issues connecting or need assistance, please contact MyChart service desk [phone] ([phone])**  **If using a computer, in order to ensure the best quality for your visit, you will need to use either of the following Internet Browsers: Google Chrome or Microsoft Edge**  . IF USING DOXIMITY or [email or web address].ME - The staff will give you instructions on receiving your link to join the meeting the day of your visit.      2-3 DAYS BEFORE YOUR APPOINTMENT  You will receive a telephone call from one of our [facility] team members - your caller ID may say "Unknown caller." If this is a video visit, we will walk you through how to get the video launched on your phone. We will remind you check your blood pressure, heart rate and weight prior to your scheduled appointment. If you have an Apple Watch or Kardia, please upload any pertinent ECG strips the day before or morning of your appointment to MyChart. Our staff will also make sure you have reviewed the consent and agree to move forward with your scheduled tele-health visit.     THE DAY OF YOUR APPOINTMENT  Approximately 15 minutes prior to your scheduled appointment, you will receive a telephone call from one of HeartCare team -   your caller ID may say "Unknown caller."  Our staff will confirm medications, vital signs for the day and any symptoms you may be experiencing. Please have this information available prior to the time of visit start. It may also be helpful for you to have a pad of paper and pen handy for any instructions given during your visit. They will also walk you through joining the smartphone meeting if this is a video visit.    CONSENT FOR TELE-HEALTH VISIT - PLEASE REVIEW  I hereby voluntarily request, consent and authorize [facility] and  its employed or contracted physicians, physician assistants, nurse practitioners or other licensed health care professionals (the Practitioner), to provide me with telemedicine health care services (the "Services") as deemed necessary by the treating Practitioner. I acknowledge and consent to receive the Services by the Practitioner via telemedicine. I understand that the telemedicine visit will involve communicating with the Practitioner through live audiovisual communication technology and the disclosure of certain medical information by electronic transmission. I acknowledge that I have been given the opportunity to request an in-person assessment or other available alternative prior to the telemedicine visit and am voluntarily participating in the telemedicine visit.  I understand that I have the right to withhold or withdraw my consent to the use of telemedicine in the course of my care at any time, without affecting my right to future care or treatment, and that the Practitioner or I may terminate the telemedicine visit at any time. I understand that I have the right to inspect all information obtained and/or recorded in the course of the telemedicine visit and may receive copies of available information for a reasonable fee.  I understand that some of the potential risks of receiving the Services via telemedicine include:  Marland Kitchen Delay or interruption in medical evaluation due to technological equipment failure or disruption; . Information transmitted may not be sufficient (e.g. poor resolution of images) to allow for appropriate medical decision making by the Practitioner; and/or  . In rare instances, security protocols could fail, causing a breach of personal health information.  Furthermore, I acknowledge that it is my responsibility to provide information about my medical history, conditions and care that is complete and accurate to the best of my ability. I acknowledge that Practitioner's advice,  recommendations, and/or decision may be based on factors not within their control, such as incomplete or inaccurate data provided by me or distortions of diagnostic images or specimens that may result from electronic transmissions. I understand that the practice of medicine is not an exact science and that Practitioner makes no warranties or guarantees regarding treatment outcomes. I acknowledge that I will receive a copy of this consent concurrently upon execution via email to the email address I last provided but may also request a printed copy by calling the office of [facility].    I understand that my insurance will be billed for this visit.   I have read or had this consent read to me. . I understand the contents of this consent, which adequately explains the benefits and risks of the Services being provided via telemedicine.  . I have been provided ample opportunity to ask questions regarding this consent and the Services and have had my questions answered to my satisfaction. . I give my informed consent for the services to be provided through the use of telemedicine in my medical care  By participating in this telemedicine visit I agree to the above. Pt gives consent for virtual visit.

## 2018-12-25 ENCOUNTER — Encounter: Payer: Self-pay | Admitting: Cardiology

## 2018-12-25 ENCOUNTER — Other Ambulatory Visit: Payer: Self-pay

## 2018-12-25 ENCOUNTER — Telehealth (INDEPENDENT_AMBULATORY_CARE_PROVIDER_SITE_OTHER): Payer: Medicare Other | Admitting: Cardiology

## 2018-12-25 VITALS — BP 111/75 | HR 74 | Ht 69.0 in | Wt 198.0 lb

## 2018-12-25 DIAGNOSIS — Z1329 Encounter for screening for other suspected endocrine disorder: Secondary | ICD-10-CM

## 2018-12-25 DIAGNOSIS — I25709 Atherosclerosis of coronary artery bypass graft(s), unspecified, with unspecified angina pectoris: Secondary | ICD-10-CM

## 2018-12-25 DIAGNOSIS — E782 Mixed hyperlipidemia: Secondary | ICD-10-CM

## 2018-12-25 DIAGNOSIS — E088 Diabetes mellitus due to underlying condition with unspecified complications: Secondary | ICD-10-CM

## 2018-12-25 DIAGNOSIS — N289 Disorder of kidney and ureter, unspecified: Secondary | ICD-10-CM

## 2018-12-25 DIAGNOSIS — I10 Essential (primary) hypertension: Secondary | ICD-10-CM

## 2018-12-25 NOTE — Progress Notes (Signed)
Virtual Visit via Video Note   This visit type was conducted due to national recommendations for restrictions regarding the COVID-19 Pandemic (e.g. social distancing) in an effort to limit this patient's exposure and mitigate transmission in our community.  Due to his co-morbid illnesses, this patient is at least at moderate risk for complications without adequate follow up.  This format is felt to be most appropriate for this patient at this time.  All issues noted in this document were discussed and addressed.  A limited physical exam was performed with this format.  Please refer to the patient's chart for his consent to telehealth for [facility].   Date:  12/25/2018   ID:  Glenn Roach, DOB Jul 09, 1946, MRN [id number]  Patient Location: Home Provider Location: Home  PCP:  Mateo Flow, MD  Cardiologist:  No primary care provider on file.  Electrophysiologist:  None   Evaluation Performed:  Follow-Up Visit  Chief Complaint: Coronary artery disease for follow-up  History of Present Illness:    Glenn Roach is a 73 y.o. male with past medical history of essential hypertension, dyslipidemia, diabetes mellitus and obesity.  Patient mentions to me that he leads a sedentary lifestyle.  Denies any chest pain orthopnea or PND.  Does not exercise on a regular basis.  At the time of my evaluation, the patient is alert awake oriented and in no distress.  The patient does not have symptoms concerning for COVID-19 infection (fever, chills, cough, or new shortness of breath).    Past Medical History:  Diagnosis Date  . CAD (coronary artery disease)   . Chest tightness   . Dyslipidemia    History reviewed. No pertinent surgical history.   Current Meds  Medication Sig  . aspirin EC 81 MG tablet Take 81 mg by mouth daily.   . clopidogrel (PLAVIX) 75 MG tablet Take 1 tablet (75 mg total) by mouth daily.  . Coenzyme Q-10 200 MG CAPS Take 200 mg by mouth daily.   . Dulaglutide  (TRULICITY [place]) Inject into the skin.  . fenofibrate 160 MG tablet Take 1 tablet (160 mg total) by mouth daily.  Marland Kitchen glimepiride (AMARYL) 2 MG tablet Take 2 mg by mouth daily with breakfast.   . isosorbide mononitrate (IMDUR) 30 MG 24 hr tablet Take 1 tablet (30 mg total) by mouth daily.  Marland Kitchen linagliptin (TRADJENTA) 5 MG TABS tablet Take 5 mg by mouth daily.   Marland Kitchen losartan (COZAAR) 50 MG tablet TAKE 50 mg twice daily  . metoprolol succinate (TOPROL-XL) 25 MG 24 hr tablet Take 1 tablet (25 mg total) by mouth daily.  . nitroGLYCERIN (NITROSTAT) 0.4 MG SL tablet Place 1 tablet (0.4 mg total) under the tongue every 5 (five) minutes as needed.  . pantoprazole (PROTONIX) 40 MG tablet Take 40 mg by mouth daily.   . Pitavastatin Calcium (LIVALO) 4 MG TABS Take 1 tablet (4 mg total) by mouth daily.  . ranolazine (RANEXA) 1000 MG SR tablet Take 1 tablet (1,000 mg total) by mouth 2 (two) times daily.  Marland Kitchen zolpidem (AMBIEN) 5 MG tablet Take 5 mg by mouth at bedtime as needed. for sleep     Allergies:   Morphine, Ramipril, Rosuvastatin, and Ticlopidine hcl   Social History   Tobacco Use  . Smoking status: Former Research scientist (life sciences)  . Smokeless tobacco: Never Used  Substance Use Topics  . Alcohol use: No  . Drug use: No     Family Hx: The patient's family history includes  Heart attack in his father; Heart disease in his mother; Heart failure in his mother; Stroke in his father.  ROS:   Please see the history of present illness.    As mentioned above All other systems reviewed and are negative.   Prior CV studies:   The following studies were reviewed today:  I reviewed lab work on this gentleman  Labs/Other Tests and Data Reviewed:    EKG:  No ECG reviewed.  Recent Labs: 09/20/2018: ALT 15; BUN 19; Creatinine, Ser 1.50; Hemoglobin 15.6; Platelets 168; Potassium 4.7; Sodium 138; TSH 1.960   Recent Lipid Panel Lab Results  Component Value Date/Time   CHOL 129 09/20/2018 10:20 AM   TRIG 137 09/20/2018  10:20 AM   HDL 44 09/20/2018 10:20 AM   CHOLHDL 2.9 09/20/2018 10:20 AM   CHOLHDL 5.3 09/08/2010 04:15 AM   LDLCALC 58 09/20/2018 10:20 AM    Wt Readings from Last 3 Encounters:  12/25/18 198 lb (89.8 kg)  06/18/18 200 lb 12.8 oz (91.1 kg)  12/07/17 204 lb 12.8 oz (92.9 kg)     Objective:    Vital Signs:  BP 111/75 (BP Location: Left Arm, Patient Position: Sitting, Cuff Size: Normal)   Pulse 74   Ht 5\' 9"  (1.753 m)   Wt 198 lb (89.8 kg)   SpO2 97%   BMI 29.24 kg/m    VITAL SIGNS:  reviewed  ASSESSMENT & PLAN:    1. Coronary artery disease: Secondary prevention stressed to the patient.  Importance of compliance with diet and medication stressed and he vocalized understanding.  Diet was discussed and risks of obesity explained.  Regular walking at least 30 minutes a day at least half an hour and at least 5 days a week was reemphasized and he plans to play 2. Essential hypertension: Blood pressure stable 3. Diabetes mellitus: Diet was discussed.  This is followed by his primary care physician 4. Mixed dyslipidemia: Lipids were reviewed and he will be back in the next few days for a Chem-7, CBC, TSH, liver lipid check and a hemoglobin A1c. 5. Patient will be seen in follow-up appointment in 6 months or earlier if the patient has any concerns 6.   COVID-19 Education: The signs and symptoms of COVID-19 were discussed with the patient and how to seek care for testing (follow up with PCP or arrange E-visit).  The importance of social distancing was discussed today.  Time:   Today, I have spent 15 minutes with the patient with telehealth technology discussing the above problems.     Medication Adjustments/Labs and Tests Ordered: Current medicines are reviewed at length with the patient today.  Concerns regarding medicines are outlined above.   Tests Ordered: No orders of the defined types were placed in this encounter.   Medication Changes: No orders of the defined types were  placed in this encounter.   Follow Up:  Virtual Visit or In Person in 4 month(s)  Signed, Jenean Lindau, MD  12/25/2018 2:38 PM    [place]

## 2018-12-25 NOTE — Addendum Note (Signed)
Addended by: Beckey Rutter on: 12/25/2018 03:14 PM   Modules accepted: Orders

## 2018-12-25 NOTE — Patient Instructions (Signed)
Medication Instructions:  Your physician recommends that you continue on your current medications as directed. Please refer to the Current Medication list given to you today.  If you need a refill on your cardiac medications before your next appointment, please call your pharmacy.   Lab work: Your physician recommends that you return FASTING for a A1c, CBC, lipid, liver, TSH and BMP to be drawn.  If you have labs (blood work) drawn today and your tests are completely normal, you will receive your results only by: Marland Kitchen MyChart Message (if you have MyChart) OR . A paper copy in the mail If you have any lab test that is abnormal or we need to change your treatment, we will call you to review the results.  Testing/Procedures: NONE  Follow-Up: At [facility], you and your health needs are our priority.  As part of our continuing mission to provide you with exceptional heart care, we have created designated Provider Care Teams.  These Care Teams include your primary Cardiologist (physician) and Advanced Practice Providers (APPs -  Physician Assistants and Nurse Practitioners) who all work together to provide you with the care you need, when you need it. You will need a follow up appointment in 6 months.

## 2018-12-27 DIAGNOSIS — N289 Disorder of kidney and ureter, unspecified: Secondary | ICD-10-CM | POA: Diagnosis not present

## 2018-12-27 DIAGNOSIS — I25709 Atherosclerosis of coronary artery bypass graft(s), unspecified, with unspecified angina pectoris: Secondary | ICD-10-CM | POA: Diagnosis not present

## 2018-12-27 DIAGNOSIS — Z1329 Encounter for screening for other suspected endocrine disorder: Secondary | ICD-10-CM | POA: Diagnosis not present

## 2018-12-27 DIAGNOSIS — E088 Diabetes mellitus due to underlying condition with unspecified complications: Secondary | ICD-10-CM | POA: Diagnosis not present

## 2018-12-27 DIAGNOSIS — E782 Mixed hyperlipidemia: Secondary | ICD-10-CM | POA: Diagnosis not present

## 2018-12-27 DIAGNOSIS — I10 Essential (primary) hypertension: Secondary | ICD-10-CM | POA: Diagnosis not present

## 2018-12-28 LAB — CBC
Hematocrit: 46.7 % (ref 37.5–51.0)
Hemoglobin: 15.8 g/dL (ref 13.0–17.7)
MCH: 30.9 pg (ref 26.6–33.0)
MCHC: 33.8 g/dL (ref 31.5–35.7)
MCV: 91 fL (ref 79–97)
Platelets: 156 10*3/uL (ref 150–450)
RBC: 5.11 x10E6/uL (ref 4.14–5.80)
RDW: 12.4 % (ref 11.6–15.4)
WBC: 4.8 10*3/uL (ref 3.4–10.8)

## 2018-12-28 LAB — LIPID PANEL
Chol/HDL Ratio: 2.7 ratio (ref 0.0–5.0)
Cholesterol, Total: 123 mg/dL (ref 100–199)
HDL: 46 mg/dL (ref >39)
LDL Calculated: 55 mg/dL (ref 0–99)
Triglycerides: 112 mg/dL (ref 0–149)
VLDL Cholesterol Cal: 22 mg/dL (ref 5–40)

## 2018-12-28 LAB — HEPATIC FUNCTION PANEL
ALT: 14 IU/L (ref 0–44)
AST: 15 IU/L (ref 0–40)
Albumin: 4.4 g/dL (ref 3.7–4.7)
Alkaline Phosphatase: 39 IU/L (ref 39–117)
Bilirubin Total: 0.8 mg/dL (ref 0.0–1.2)
Bilirubin, Direct: 0.34 mg/dL (ref 0.00–0.40)
Total Protein: 6.7 g/dL (ref 6.0–8.5)

## 2018-12-28 LAB — BASIC METABOLIC PANEL
BUN/Creatinine Ratio: 13 (ref 10–24)
BUN: 22 mg/dL (ref 8–27)
CO2: 22 mmol/L (ref 20–29)
Calcium: 9.6 mg/dL (ref 8.6–10.2)
Chloride: 108 mmol/L — ABNORMAL HIGH (ref 96–106)
Creatinine, Ser: 1.74 mg/dL — ABNORMAL HIGH (ref 0.76–1.27)
GFR calc Af Amer: 44 mL/min/{1.73_m2} — ABNORMAL LOW (ref >59)
GFR calc non Af Amer: 38 mL/min/{1.73_m2} — ABNORMAL LOW (ref >59)
Glucose: 101 mg/dL — ABNORMAL HIGH (ref 65–99)
Potassium: 5.2 mmol/L (ref 3.5–5.2)
Sodium: 145 mmol/L — ABNORMAL HIGH (ref 134–144)

## 2018-12-28 LAB — TSH: TSH: 1.99 u[IU]/mL (ref 0.450–4.500)

## 2018-12-28 LAB — HEMOGLOBIN A1C
Est. average glucose Bld gHb Est-mCnc: 131 mg/dL
Hgb A1c MFr Bld: 6.2 % — ABNORMAL HIGH (ref 4.8–5.6)

## 2019-01-01 ENCOUNTER — Telehealth: Payer: Self-pay

## 2019-01-01 DIAGNOSIS — I10 Essential (primary) hypertension: Secondary | ICD-10-CM

## 2019-01-01 DIAGNOSIS — N289 Disorder of kidney and ureter, unspecified: Secondary | ICD-10-CM

## 2019-01-01 DIAGNOSIS — E088 Diabetes mellitus due to underlying condition with unspecified complications: Secondary | ICD-10-CM

## 2019-01-01 DIAGNOSIS — E782 Mixed hyperlipidemia: Secondary | ICD-10-CM

## 2019-01-01 NOTE — Addendum Note (Signed)
Addended by: Beckey Rutter on: 01/01/2019 04:21 PM   Modules accepted: Orders

## 2019-01-01 NOTE — Telephone Encounter (Signed)
-----   Message from Jenean Lindau, MD sent at 12/28/2018  9:53 AM EDT ----- Labs are fine.  Reduce salt in diet.  Renal function is a little worse and he needs to speak to his primary care physician about this.  Recheck lab work in 2 months.  This is for Chem-7.  Liver lipid check in 3 months.cc pcp Jenean Lindau, MD 12/28/2018 9:53 AM

## 2019-01-01 NOTE — Telephone Encounter (Signed)
Left message to call back for results, copy of results sent to Dr. Humphrey Rolls.

## 2019-01-01 NOTE — Telephone Encounter (Signed)
Results relayed to patient and he is ok with follow up labs in 2 mo. No further questions at this time.

## 2019-01-14 ENCOUNTER — Other Ambulatory Visit: Payer: Self-pay

## 2019-01-14 MED ORDER — RANOLAZINE ER 1000 MG PO TB12: 1000.0000 mg | ORAL_TABLET | Freq: Two times a day (BID) | ORAL | 2 refills | Status: DC

## 2019-02-13 ENCOUNTER — Other Ambulatory Visit: Payer: Self-pay | Admitting: Cardiology

## 2019-02-13 MED ORDER — METOPROLOL SUCCINATE ER 25 MG PO TB24: 25.0000 mg | ORAL_TABLET | Freq: Every day | ORAL | 1 refills | Status: DC

## 2019-02-13 MED ORDER — FENOFIBRATE 160 MG PO TABS: 160.0000 mg | ORAL_TABLET | Freq: Every day | ORAL | 1 refills | Status: DC

## 2019-02-13 MED ORDER — ISOSORBIDE MONONITRATE ER 30 MG PO TB24: 30.0000 mg | ORAL_TABLET | Freq: Every day | ORAL | 1 refills | Status: DC

## 2019-02-13 NOTE — Telephone Encounter (Signed)
°*  STAT* If patient is at the pharmacy, call can be transferred to refill team.   1. Which medications need to be refilled? (please list name of each medication and dose if known) Fenofibrate 160mg , Metoprolol suc 25mg  ER, Isosorb mono 30mg  ER  2. Which pharmacy/location (including street and city if local pharmacy) is medication to be sent to?[facility]  3. Do they need a 30 day or 90 day supply? [place]

## 2019-02-13 NOTE — Telephone Encounter (Signed)
Fenofibrate, metoprolol and isosorbide refills sent to  [facility]

## 2019-02-15 DIAGNOSIS — E1169 Type 2 diabetes mellitus with other specified complication: Secondary | ICD-10-CM | POA: Diagnosis not present

## 2019-02-15 DIAGNOSIS — E669 Obesity, unspecified: Secondary | ICD-10-CM | POA: Diagnosis not present

## 2019-02-15 DIAGNOSIS — N183 Chronic kidney disease, stage 3 (moderate): Secondary | ICD-10-CM | POA: Diagnosis not present

## 2019-02-15 DIAGNOSIS — E1122 Type 2 diabetes mellitus with diabetic chronic kidney disease: Secondary | ICD-10-CM | POA: Diagnosis not present

## 2019-03-28 ENCOUNTER — Telehealth: Payer: Self-pay | Admitting: *Deleted

## 2019-03-28 MED ORDER — EZETIMIBE 10 MG PO TABS: 10.0000 mg | ORAL_TABLET | Freq: Every day | ORAL | 1 refills | Status: DC

## 2019-03-28 NOTE — Telephone Encounter (Signed)
Rx refill sent to pharmacy. 

## 2019-04-03 ENCOUNTER — Other Ambulatory Visit: Payer: Self-pay

## 2019-04-03 MED ORDER — EZETIMIBE 10 MG PO TABS: 10.0000 mg | ORAL_TABLET | Freq: Every day | ORAL | 1 refills | Status: DC

## 2019-04-11 DIAGNOSIS — H40003 Preglaucoma, unspecified, bilateral: Secondary | ICD-10-CM | POA: Diagnosis not present

## 2019-04-11 DIAGNOSIS — Z794 Long term (current) use of insulin: Secondary | ICD-10-CM | POA: Diagnosis not present

## 2019-04-11 DIAGNOSIS — E119 Type 2 diabetes mellitus without complications: Secondary | ICD-10-CM | POA: Diagnosis not present

## 2019-04-11 DIAGNOSIS — D3132 Benign neoplasm of left choroid: Secondary | ICD-10-CM | POA: Diagnosis not present

## 2019-04-15 ENCOUNTER — Other Ambulatory Visit: Payer: Self-pay | Admitting: Cardiology

## 2019-04-23 ENCOUNTER — Other Ambulatory Visit: Payer: Self-pay

## 2019-04-23 MED ORDER — CLOPIDOGREL BISULFATE 75 MG PO TABS: 75.0000 mg | ORAL_TABLET | Freq: Every day | ORAL | 2 refills | Status: DC

## 2019-04-30 ENCOUNTER — Other Ambulatory Visit: Payer: Self-pay

## 2019-04-30 MED ORDER — FENOFIBRATE 160 MG PO TABS: 160.0000 mg | ORAL_TABLET | Freq: Every day | ORAL | 1 refills | Status: DC

## 2019-04-30 MED ORDER — EZETIMIBE 10 MG PO TABS: 10.0000 mg | ORAL_TABLET | Freq: Every day | ORAL | 1 refills | Status: DC

## 2019-05-08 DIAGNOSIS — N3941 Urge incontinence: Secondary | ICD-10-CM | POA: Diagnosis not present

## 2019-05-08 DIAGNOSIS — N401 Enlarged prostate with lower urinary tract symptoms: Secondary | ICD-10-CM | POA: Diagnosis not present

## 2019-06-24 DIAGNOSIS — E669 Obesity, unspecified: Secondary | ICD-10-CM | POA: Diagnosis not present

## 2019-06-24 DIAGNOSIS — R1032 Left lower quadrant pain: Secondary | ICD-10-CM | POA: Diagnosis not present

## 2019-06-24 DIAGNOSIS — E1169 Type 2 diabetes mellitus with other specified complication: Secondary | ICD-10-CM | POA: Diagnosis not present

## 2019-06-24 DIAGNOSIS — N183 Chronic kidney disease, stage 3 unspecified: Secondary | ICD-10-CM | POA: Diagnosis not present

## 2019-06-27 ENCOUNTER — Encounter: Payer: Self-pay | Admitting: Cardiology

## 2019-06-27 ENCOUNTER — Ambulatory Visit (INDEPENDENT_AMBULATORY_CARE_PROVIDER_SITE_OTHER): Payer: Medicare Other | Admitting: Cardiology

## 2019-06-27 ENCOUNTER — Other Ambulatory Visit: Payer: Self-pay

## 2019-06-27 VITALS — BP 132/70 | HR 75 | Ht 69.0 in | Wt 195.4 lb

## 2019-06-27 DIAGNOSIS — E782 Mixed hyperlipidemia: Secondary | ICD-10-CM | POA: Diagnosis not present

## 2019-06-27 DIAGNOSIS — N289 Disorder of kidney and ureter, unspecified: Secondary | ICD-10-CM

## 2019-06-27 DIAGNOSIS — E088 Diabetes mellitus due to underlying condition with unspecified complications: Secondary | ICD-10-CM

## 2019-06-27 DIAGNOSIS — I25709 Atherosclerosis of coronary artery bypass graft(s), unspecified, with unspecified angina pectoris: Secondary | ICD-10-CM

## 2019-06-27 DIAGNOSIS — I10 Essential (primary) hypertension: Secondary | ICD-10-CM

## 2019-06-27 NOTE — Patient Instructions (Signed)
Medication Instructions:  Your physician recommends that you continue on your current medications as directed. Please refer to the Current Medication list given to you today.   *If you need a refill on your cardiac medications before your next appointment, please call your pharmacy*  Lab Work: Your physician recommends that you return for FASTING Labwork in the Next Few Days:  BMET TSH LFT's Lipids  If you have labs (blood work) drawn today and your tests are completely normal, you will receive your results only by: Marland Kitchen MyChart Message (if you have MyChart) OR . A paper copy in the mail If you have any lab test that is abnormal or we need to change your treatment, we will call you to review the results.  Testing/Procedures: None Ordered  Follow-Up: At [facility], you and your health needs are our priority.  As part of our continuing mission to provide you with exceptional heart care, we have created designated Provider Care Teams.  These Care Teams include your primary Cardiologist (physician) and Advanced Practice Providers (APPs -  Physician Assistants and Nurse Practitioners) who all work together to provide you with the care you need, when you need it.  Your next appointment:   6 month(s)  The format for your next appointment:   In Person  Provider:   Jyl Heinz, MD

## 2019-06-27 NOTE — Progress Notes (Signed)
Cardiology Office Note:    Date:  06/27/2019   ID:  Glenn Roach, DOB 1946-01-07, MRN [id number]  PCP:  Glenn Flow, MD  Cardiologist:  Glenn Lindau, MD   Referring MD: Glenn Flow, MD    ASSESSMENT:    1. Coronary artery disease involving coronary bypass graft of native heart with angina pectoris ([place])   2. Mixed dyslipidemia   3. Mild renal insufficiency   4. Diabetes mellitus due to underlying condition with unspecified complications ([place])   5. Essential hypertension    PLAN:    In order of problems listed above:  1. Coronary artery disease: Secondary prevention stressed with the patient.  Importance of compliance with diet and medication stressed and he vocalized understanding.  He has not been very compliant with an exercise protocol and he promises to do better. 2. Essential hypertension: Blood pressure stable 3. Mixed dyslipidemia: Lipids were reviewed and he will have them checked in the next few days.  He will come back fasting. 4. Renal insufficiency: Managed by his primary care physician 5. Diabetes mellitus: Importance of regular exercise stressed.  Weight reduction stressed he has an a component of abdominal obesity and risks explained 6. Patient will be seen in follow-up appointment in 6 months or earlier if the patient has any concerns    Medication Adjustments/Labs and Tests Ordered: Current medicines are reviewed at length with the patient today.  Concerns regarding medicines are outlined above.  No orders of the defined types were placed in this encounter.  No orders of the defined types were placed in this encounter.    No chief complaint on file.    History of Present Illness:    Glenn Roach is a 73 y.o. male.  Patient has past medical history of coronary artery disease, essential hypertension, dyslipidemia and diabetes mellitus.  He denies any problems at this time and takes care of activities of daily living.  No chest pain  orthopnea or PND.  At the time of my evaluation, the patient is alert awake oriented and in no distress.  Past Medical History:  Diagnosis Date  . CAD (coronary artery disease)   . Chest tightness   . Dyslipidemia     History reviewed. No pertinent surgical history.  Current Medications: Current Meds  Medication Sig  . aspirin EC 81 MG tablet Take 81 mg by mouth daily.   . clopidogrel (PLAVIX) 75 MG tablet Take 1 tablet (75 mg total) by mouth daily.  . Coenzyme Q-10 200 MG CAPS Take 200 mg by mouth daily.   . Dulaglutide (TRULICITY [place]) Inject into the skin.  Marland Kitchen ezetimibe (ZETIA) 10 MG tablet Take 1 tablet (10 mg total) by mouth daily.  . fenofibrate 160 MG tablet Take 1 tablet (160 mg total) by mouth daily.  Marland Kitchen glimepiride (AMARYL) 2 MG tablet Take 2 mg by mouth daily with breakfast.   . isosorbide mononitrate (IMDUR) 30 MG 24 hr tablet Take 1 tablet (30 mg total) by mouth daily.  Marland Kitchen LIVALO 4 MG TABS TAKE 1 TABLET BY MOUTH DAILY.  . metoprolol succinate (TOPROL-XL) 25 MG 24 hr tablet Take 1 tablet (25 mg total) by mouth daily.  . nitroGLYCERIN (NITROSTAT) 0.4 MG SL tablet Place 1 tablet (0.4 mg total) under the tongue every 5 (five) minutes as needed.  . pantoprazole (PROTONIX) 40 MG tablet Take 40 mg by mouth daily.   . ranolazine (RANEXA) 1000 MG SR tablet Take 1 tablet (1,000 mg total)  by mouth 2 (two) times daily.     Allergies:   Morphine, Ramipril, Rosuvastatin, and Ticlopidine hcl   Social History   Socioeconomic History  . Marital status: Married    Spouse name: Not on file  . Number of children: Not on file  . Years of education: Not on file  . Highest education level: Not on file  Occupational History  . Not on file  Tobacco Use  . Smoking status: Former Research scientist (life sciences)  . Smokeless tobacco: Never Used  Substance and Sexual Activity  . Alcohol use: No  . Drug use: No  . Sexual activity: Not on file  Other Topics Concern  . Not on file  Social History Narrative  . Not  on file   Social Determinants of Health   Financial Resource Strain:   . Difficulty of Paying Living Expenses: Not on file  Food Insecurity:   . Worried About Charity fundraiser in the Last Year: Not on file  . Ran Out of Food in the Last Year: Not on file  Transportation Needs:   . Lack of Transportation (Medical): Not on file  . Lack of Transportation (Non-Medical): Not on file  Physical Activity:   . Days of Exercise per Week: Not on file  . Minutes of Exercise per Session: Not on file  Stress:   . Feeling of Stress : Not on file  Social Connections:   . Frequency of Communication with Friends and Family: Not on file  . Frequency of Social Gatherings with Friends and Family: Not on file  . Attends Religious Services: Not on file  . Active Member of Clubs or Organizations: Not on file  . Attends Archivist Meetings: Not on file  . Marital Status: Not on file     Family History: The patient's family history includes Heart attack in his father; Heart disease in his mother; Heart failure in his mother; Stroke in his father.  ROS:   Please see the history of present illness.    All other systems reviewed and are negative.  EKGs/Labs/Other Studies Reviewed:    The following studies were reviewed today: I reviewed lab work done from last evaluation   Recent Labs: 12/27/2018: ALT 14; BUN 22; Creatinine, Ser 1.74; Hemoglobin 15.8; Platelets 156; Potassium 5.2; Sodium 145; TSH 1.990  Recent Lipid Panel    Component Value Date/Time   CHOL 123 12/27/2018 1124   TRIG 112 12/27/2018 1124   HDL 46 12/27/2018 1124   CHOLHDL 2.7 12/27/2018 1124   CHOLHDL 5.3 09/08/2010 0415   VLDL UNABLE TO CALCULATE IF TRIGLYCERIDE OVER 400 mg/dL 09/08/2010 0415   LDLCALC 55 12/27/2018 1124    Physical Exam:    VS:  BP 132/70   Pulse 75   Ht 5\' 9"  (1.753 m)   Wt 195 lb 6.4 oz (88.6 kg)   SpO2 98%   BMI 28.86 kg/m     Wt Readings from Last 3 Encounters:  06/27/19 195 lb 6.4  oz (88.6 kg)  12/25/18 198 lb (89.8 kg)  06/18/18 200 lb 12.8 oz (91.1 kg)     GEN: Patient is in no acute distress HEENT: Normal NECK: No JVD; No carotid bruits LYMPHATICS: No lymphadenopathy CARDIAC: Hear sounds regular, 2/6 systolic murmur at the apex. RESPIRATORY:  Clear to auscultation without rales, wheezing or rhonchi  ABDOMEN: Soft, non-tender, non-distended MUSCULOSKELETAL:  No edema; No deformity  SKIN: Warm and dry NEUROLOGIC:  Alert and oriented x 3 PSYCHIATRIC:  Normal  affect   Signed, Glenn Lindau, MD  06/27/2019 11:05 AM    [place]

## 2019-07-23 ENCOUNTER — Other Ambulatory Visit: Payer: Self-pay | Admitting: Cardiology

## 2019-07-23 DIAGNOSIS — D0462 Carcinoma in situ of skin of left upper limb, including shoulder: Secondary | ICD-10-CM | POA: Diagnosis not present

## 2019-07-23 DIAGNOSIS — D692 Other nonthrombocytopenic purpura: Secondary | ICD-10-CM | POA: Diagnosis not present

## 2019-07-23 DIAGNOSIS — L821 Other seborrheic keratosis: Secondary | ICD-10-CM | POA: Diagnosis not present

## 2019-07-23 DIAGNOSIS — L570 Actinic keratosis: Secondary | ICD-10-CM | POA: Diagnosis not present

## 2019-07-23 DIAGNOSIS — D485 Neoplasm of uncertain behavior of skin: Secondary | ICD-10-CM | POA: Diagnosis not present

## 2019-08-13 DIAGNOSIS — H04129 Dry eye syndrome of unspecified lacrimal gland: Secondary | ICD-10-CM | POA: Diagnosis not present

## 2019-08-13 DIAGNOSIS — H40003 Preglaucoma, unspecified, bilateral: Secondary | ICD-10-CM | POA: Diagnosis not present

## 2019-08-15 ENCOUNTER — Other Ambulatory Visit: Payer: Self-pay | Admitting: Cardiology

## 2019-08-19 ENCOUNTER — Other Ambulatory Visit: Payer: Self-pay

## 2019-08-19 DIAGNOSIS — R079 Chest pain, unspecified: Secondary | ICD-10-CM

## 2019-08-19 MED ORDER — METOPROLOL TARTRATE 25 MG PO TABS: 12.5000 mg | ORAL_TABLET | Freq: Two times a day (BID) | ORAL | 3 refills | Status: DC

## 2019-08-19 MED ORDER — NITROGLYCERIN 0.4 MG SL SUBL: 0.4000 mg | SUBLINGUAL_TABLET | SUBLINGUAL | 11 refills | Status: DC | PRN

## 2019-08-19 NOTE — Telephone Encounter (Signed)
Called and spoke with Glenn Roach to instruct hi that Dr. Geraldo Pitter changed his metoprolol succ to metoprolol tartrate 12.5 mg twice a day.  RX has been sent to [facility] as well as a NTG refill that the pt requested while discussing the above. Pt had no questions and verbalized the medication changes.

## 2019-10-09 DIAGNOSIS — E782 Mixed hyperlipidemia: Secondary | ICD-10-CM | POA: Diagnosis not present

## 2019-10-09 DIAGNOSIS — I259 Chronic ischemic heart disease, unspecified: Secondary | ICD-10-CM | POA: Diagnosis not present

## 2019-10-22 ENCOUNTER — Other Ambulatory Visit: Payer: Self-pay | Admitting: Cardiology

## 2019-11-08 ENCOUNTER — Other Ambulatory Visit: Payer: Self-pay | Admitting: Cardiology

## 2019-11-08 DIAGNOSIS — I259 Chronic ischemic heart disease, unspecified: Secondary | ICD-10-CM | POA: Diagnosis not present

## 2019-11-08 DIAGNOSIS — E782 Mixed hyperlipidemia: Secondary | ICD-10-CM | POA: Diagnosis not present

## 2019-11-08 DIAGNOSIS — E1122 Type 2 diabetes mellitus with diabetic chronic kidney disease: Secondary | ICD-10-CM | POA: Diagnosis not present

## 2019-11-14 DIAGNOSIS — E119 Type 2 diabetes mellitus without complications: Secondary | ICD-10-CM | POA: Diagnosis not present

## 2019-11-14 DIAGNOSIS — D3132 Benign neoplasm of left choroid: Secondary | ICD-10-CM | POA: Diagnosis not present

## 2019-11-14 DIAGNOSIS — H40003 Preglaucoma, unspecified, bilateral: Secondary | ICD-10-CM | POA: Diagnosis not present

## 2019-11-14 DIAGNOSIS — H25813 Combined forms of age-related cataract, bilateral: Secondary | ICD-10-CM | POA: Diagnosis not present

## 2019-12-27 NOTE — Addendum Note (Signed)
Addended by: Truddie Hidden on: 12/27/2019 09:20 AM   Modules accepted: Orders

## 2019-12-28 LAB — BASIC METABOLIC PANEL
BUN/Creatinine Ratio: 16 (ref 10–24)
BUN: 26 mg/dL (ref 8–27)
CO2: 24 mmol/L (ref 20–29)
Calcium: 9.6 mg/dL (ref 8.6–10.2)
Chloride: 106 mmol/L (ref 96–106)
Creatinine, Ser: 1.6 mg/dL — ABNORMAL HIGH (ref 0.76–1.27)
GFR calc Af Amer: 48 mL/min/{1.73_m2} — ABNORMAL LOW (ref >59)
GFR calc non Af Amer: 42 mL/min/{1.73_m2} — ABNORMAL LOW (ref >59)
Glucose: 130 mg/dL — ABNORMAL HIGH (ref 65–99)
Potassium: 5.1 mmol/L (ref 3.5–5.2)
Sodium: 143 mmol/L (ref 134–144)

## 2019-12-28 LAB — TSH: TSH: 1.65 u[IU]/mL (ref 0.450–4.500)

## 2019-12-28 LAB — LIPID PANEL
Chol/HDL Ratio: 3 ratio (ref 0.0–5.0)
Cholesterol, Total: 139 mg/dL (ref 100–199)
HDL: 46 mg/dL (ref >39)
LDL Chol Calc (NIH): 67 mg/dL (ref 0–99)
Triglycerides: 148 mg/dL (ref 0–149)
VLDL Cholesterol Cal: 26 mg/dL (ref 5–40)

## 2019-12-28 LAB — HEPATIC FUNCTION PANEL
ALT: 16 IU/L (ref 0–44)
AST: 18 IU/L (ref 0–40)
Albumin: 4.2 g/dL (ref 3.7–4.7)
Alkaline Phosphatase: 44 IU/L — ABNORMAL LOW (ref 48–121)
Bilirubin Total: 0.5 mg/dL (ref 0.0–1.2)
Bilirubin, Direct: 0.25 mg/dL (ref 0.00–0.40)
Total Protein: 6.6 g/dL (ref 6.0–8.5)

## 2020-01-02 ENCOUNTER — Other Ambulatory Visit: Payer: Self-pay

## 2020-01-02 ENCOUNTER — Ambulatory Visit (INDEPENDENT_AMBULATORY_CARE_PROVIDER_SITE_OTHER): Payer: Medicare Other | Admitting: Cardiology

## 2020-01-02 ENCOUNTER — Encounter: Payer: Self-pay | Admitting: Cardiology

## 2020-01-02 VITALS — BP 110/70 | HR 69 | Ht 69.0 in | Wt 193.0 lb

## 2020-01-02 DIAGNOSIS — N289 Disorder of kidney and ureter, unspecified: Secondary | ICD-10-CM

## 2020-01-02 DIAGNOSIS — I10 Essential (primary) hypertension: Secondary | ICD-10-CM | POA: Diagnosis not present

## 2020-01-02 DIAGNOSIS — E088 Diabetes mellitus due to underlying condition with unspecified complications: Secondary | ICD-10-CM | POA: Diagnosis not present

## 2020-01-02 DIAGNOSIS — I25709 Atherosclerosis of coronary artery bypass graft(s), unspecified, with unspecified angina pectoris: Secondary | ICD-10-CM | POA: Diagnosis not present

## 2020-01-02 DIAGNOSIS — E782 Mixed hyperlipidemia: Secondary | ICD-10-CM

## 2020-01-02 NOTE — Patient Instructions (Signed)
Medication Instructions:  No medication changes. *If you need a refill on your cardiac medications before your next appointment, please call your pharmacy*   Lab Work: Your physician recommends that you have your magnesium drawn today.  If you have labs (blood work) drawn today and your tests are completely normal, you will receive your results only by: Marland Kitchen MyChart Message (if you have MyChart) OR . A paper copy in the mail If you have any lab test that is abnormal or we need to change your treatment, we will call you to review the results.   Testing/Procedures: None ordered   Follow-Up: At [facility], you and your health needs are our priority.  As part of our continuing mission to provide you with exceptional heart care, we have created designated Provider Care Teams.  These Care Teams include your primary Cardiologist (physician) and Advanced Practice Providers (APPs -  Physician Assistants and Nurse Practitioners) who all work together to provide you with the care you need, when you need it.  We recommend signing up for the patient portal called "MyChart".  Sign up information is provided on this After Visit Summary.  MyChart is used to connect with patients for Virtual Visits (Telemedicine).  Patients are able to view lab/test results, encounter notes, upcoming appointments, etc.  Non-urgent messages can be sent to your provider as well.   To learn more about what you can do with MyChart, go to [email or web address].    Your next appointment:   6 month(s)  The format for your next appointment:   In Person  Provider:   Jyl Heinz, MD   Other Instructions NA

## 2020-01-02 NOTE — Progress Notes (Signed)
Cardiology Office Note:    Date:  01/02/2020   ID:  Glenn Roach, DOB [birth year], MRN [id number]  PCP:  Mateo Flow, MD  Cardiologist:  Jenean Lindau, MD   Referring MD: Mateo Flow, MD    ASSESSMENT:    1. Coronary artery disease involving coronary bypass graft of native heart with angina pectoris ([place])   2. Essential hypertension   3. Diabetes mellitus due to underlying condition with unspecified complications ([place])   4. Mild renal insufficiency   5. Mixed dyslipidemia    PLAN:    In order of problems listed above:  1. Coronary artery disease: Secondary prevention stressed with the patient.  Importance of compliance with diet medication stressed and vocalized understanding.  Importance of regular exercise stressed.  He leads a sedentary lifestyle.  I told him to walk at least half an hour a day 5 days a week on a regular basis and he promises to do so. 2. Essential hypertension: Blood pressure is stable 3. Mixed dyslipidemia: Labs were reviewed and diet was emphasized. 4. Abnormal EKG: With QT prolongation and we will have blood work magnesium level checked today.  Recent blood work was otherwise unremarkable. 5. Diabetes mellitus and abdominal obesity: Diet was emphasized and he promises to do better. 6. Renal insufficiency: Stable at this time and managed by primary care. 7. Patient will be seen in follow-up appointment in 6 months or earlier if the patient has any concerns     Medication Adjustments/Labs and Tests Ordered: Current medicines are reviewed at length with the patient today.  Concerns regarding medicines are outlined above.  No orders of the defined types were placed in this encounter.  No orders of the defined types were placed in this encounter.    Chief Complaint  Patient presents with  . Follow-up     History of Present Illness:    Glenn Roach is a 74 y.o. male.  Patient has past medical history of coronary artery disease post  CABG x3, essential hypertension dyslipidemia diabetes mellitus and renal insufficiency.  He denies any problems at this time and takes care of activities of daily living.  He denies any chest pain orthopnea or PND.  At the time of my evaluation, the patient is alert awake oriented and in no distress.  Past Medical History:  Diagnosis Date  . CAD (coronary artery disease)   . Chest tightness   . Dyslipidemia     History reviewed. No pertinent surgical history.  Current Medications: Current Meds  Medication Sig  . aspirin EC 81 MG tablet Take 81 mg by mouth daily.   . clopidogrel (PLAVIX) 75 MG tablet Take 1 tablet (75 mg total) by mouth daily.  . Coenzyme Q-10 200 MG CAPS Take 200 mg by mouth daily.   . Dulaglutide (TRULICITY [place]) Inject into the skin.  Marland Kitchen ezetimibe (ZETIA) 10 MG tablet Take 1 tablet (10 mg total) by mouth daily.  . fenofibrate 160 MG tablet Take 1 tablet (160 mg total) by mouth daily.  Marland Kitchen glimepiride (AMARYL) 2 MG tablet Take 2 mg by mouth daily with breakfast.   . isosorbide mononitrate (IMDUR) 30 MG 24 hr tablet TAKE 1 TABLET BY MOUTH ONCE DAILY.  Marland Kitchen LIVALO 4 MG TABS TAKE 1 TABLET BY MOUTH DAILY  . metoprolol tartrate (LOPRESSOR) 25 MG tablet Take 0.5 tablets (12.5 mg total) by mouth 2 (two) times daily.  . Multiple Vitamins-Minerals (CENTRUM SILVER PO) Take by mouth.  . nitroGLYCERIN (  NITROSTAT) 0.4 MG SL tablet Place 1 tablet (0.4 mg total) under the tongue every 5 (five) minutes as needed.  . pantoprazole (PROTONIX) 40 MG tablet Take 40 mg by mouth daily.   . ranolazine (RANEXA) 1000 MG SR tablet Take 1 tablet (1,000 mg total) by mouth 2 (two) times daily. PLEASE CALL OFFICE TO SCHEDULE FOLLOW UP APPOINTMENT  . tamsulosin (FLOMAX) 0.4 MG CAPS capsule Take 0.4 mg by mouth daily.     Allergies:   Morphine, Ramipril, Rosuvastatin, and Ticlopidine hcl   Social History   Socioeconomic History  . Marital status: Married    Spouse name: Not on file  . Number of  children: Not on file  . Years of education: Not on file  . Highest education level: Not on file  Occupational History  . Not on file  Tobacco Use  . Smoking status: Former Research scientist (life sciences)  . Smokeless tobacco: Never Used  Vaping Use  . Vaping Use: Never used  Substance and Sexual Activity  . Alcohol use: No  . Drug use: No  . Sexual activity: Not on file  Other Topics Concern  . Not on file  Social History Narrative  . Not on file   Social Determinants of Health   Financial Resource Strain:   . Difficulty of Paying Living Expenses:   Food Insecurity:   . Worried About Charity fundraiser in the Last Year:   . Arboriculturist in the Last Year:   Transportation Needs:   . Film/video editor (Medical):   Marland Kitchen Lack of Transportation (Non-Medical):   Physical Activity:   . Days of Exercise per Week:   . Minutes of Exercise per Session:   Stress:   . Feeling of Stress :   Social Connections:   . Frequency of Communication with Friends and Family:   . Frequency of Social Gatherings with Friends and Family:   . Attends Religious Services:   . Active Member of Clubs or Organizations:   . Attends Archivist Meetings:   Marland Kitchen Marital Status:      Family History: The patient's family history includes Heart attack in his father; Heart disease in his mother; Heart failure in his mother; Stroke in his father.  ROS:   Please see the history of present illness.    All other systems reviewed and are negative.  EKGs/Labs/Other Studies Reviewed:    The following studies were reviewed today: I discussed my findings with the patient at length.  EKG reveals sinus rhythm and cuticle infection.   Recent Labs: 12/27/2019: ALT 16; BUN 26; Creatinine, Ser 1.60; Potassium 5.1; Sodium 143; TSH 1.650  Recent Lipid Panel    Component Value Date/Time   CHOL 139 12/27/2019 0923   TRIG 148 12/27/2019 0923   HDL 46 12/27/2019 0923   CHOLHDL 3.0 12/27/2019 0923   CHOLHDL 5.3 09/08/2010 0415    VLDL UNABLE TO CALCULATE IF TRIGLYCERIDE OVER 400 mg/dL 09/08/2010 0415   LDLCALC 67 12/27/2019 0923    Physical Exam:    VS:  BP 110/70   Pulse 69   Ht 5\' 9"  (1.753 m)   Wt 193 lb (87.5 kg)   SpO2 97%   BMI 28.50 kg/m     Wt Readings from Last 3 Encounters:  01/02/20 193 lb (87.5 kg)  06/27/19 195 lb 6.4 oz (88.6 kg)  12/25/18 198 lb (89.8 kg)     GEN: Patient is in no acute distress HEENT: Normal NECK: No JVD;  No carotid bruits LYMPHATICS: No lymphadenopathy CARDIAC: Hear sounds regular, 2/6 systolic murmur at the apex. RESPIRATORY:  Clear to auscultation without rales, wheezing or rhonchi  ABDOMEN: Soft, non-tender, non-distended MUSCULOSKELETAL:  No edema; No deformity  SKIN: Warm and dry NEUROLOGIC:  Alert and oriented x 3 PSYCHIATRIC:  Normal affect   Signed, Jenean Lindau, MD  01/02/2020 2:47 PM    [place]

## 2020-01-02 NOTE — Addendum Note (Signed)
Addended by: Helena Sardo, Jonelle Sidle L on: 01/02/2020 04:08 PM   Modules accepted: Orders

## 2020-01-03 LAB — MAGNESIUM: Magnesium: 2 mg/dL (ref 1.6–2.3)

## 2020-01-17 ENCOUNTER — Other Ambulatory Visit: Payer: Self-pay | Admitting: Cardiology

## 2020-01-30 ENCOUNTER — Other Ambulatory Visit: Payer: Self-pay | Admitting: Cardiology

## 2020-02-10 ENCOUNTER — Other Ambulatory Visit: Payer: Self-pay | Admitting: Cardiology

## 2020-02-27 ENCOUNTER — Other Ambulatory Visit: Payer: Self-pay | Admitting: Cardiology

## 2020-03-11 DIAGNOSIS — Z23 Encounter for immunization: Secondary | ICD-10-CM | POA: Diagnosis not present

## 2020-03-11 DIAGNOSIS — E782 Mixed hyperlipidemia: Secondary | ICD-10-CM | POA: Diagnosis not present

## 2020-03-11 DIAGNOSIS — E1169 Type 2 diabetes mellitus with other specified complication: Secondary | ICD-10-CM | POA: Diagnosis not present

## 2020-03-11 DIAGNOSIS — E669 Obesity, unspecified: Secondary | ICD-10-CM | POA: Diagnosis not present

## 2020-03-11 DIAGNOSIS — Z6828 Body mass index (BMI) 28.0-28.9, adult: Secondary | ICD-10-CM | POA: Diagnosis not present

## 2020-03-11 DIAGNOSIS — Z0000 Encounter for general adult medical examination without abnormal findings: Secondary | ICD-10-CM | POA: Diagnosis not present

## 2020-04-09 DIAGNOSIS — N183 Chronic kidney disease, stage 3 unspecified: Secondary | ICD-10-CM | POA: Diagnosis not present

## 2020-04-09 DIAGNOSIS — E1122 Type 2 diabetes mellitus with diabetic chronic kidney disease: Secondary | ICD-10-CM | POA: Diagnosis not present

## 2020-04-09 DIAGNOSIS — E7849 Other hyperlipidemia: Secondary | ICD-10-CM | POA: Diagnosis not present

## 2020-04-16 ENCOUNTER — Other Ambulatory Visit: Payer: Self-pay | Admitting: Cardiology

## 2020-04-28 DIAGNOSIS — N401 Enlarged prostate with lower urinary tract symptoms: Secondary | ICD-10-CM | POA: Diagnosis not present

## 2020-04-28 DIAGNOSIS — N200 Calculus of kidney: Secondary | ICD-10-CM | POA: Diagnosis not present

## 2020-04-28 DIAGNOSIS — R3912 Poor urinary stream: Secondary | ICD-10-CM | POA: Diagnosis not present

## 2020-05-05 ENCOUNTER — Other Ambulatory Visit: Payer: Self-pay

## 2020-05-05 DIAGNOSIS — I2510 Atherosclerotic heart disease of native coronary artery without angina pectoris: Secondary | ICD-10-CM | POA: Insufficient documentation

## 2020-05-05 DIAGNOSIS — E785 Hyperlipidemia, unspecified: Secondary | ICD-10-CM | POA: Insufficient documentation

## 2020-05-05 DIAGNOSIS — R0789 Other chest pain: Secondary | ICD-10-CM | POA: Insufficient documentation

## 2020-05-06 ENCOUNTER — Ambulatory Visit (INDEPENDENT_AMBULATORY_CARE_PROVIDER_SITE_OTHER): Payer: Medicare Other | Admitting: Cardiology

## 2020-05-06 ENCOUNTER — Encounter: Payer: Self-pay | Admitting: Cardiology

## 2020-05-06 ENCOUNTER — Other Ambulatory Visit: Payer: Self-pay

## 2020-05-06 VITALS — BP 112/72 | HR 66 | Ht 69.0 in | Wt 193.4 lb

## 2020-05-06 DIAGNOSIS — I25709 Atherosclerosis of coronary artery bypass graft(s), unspecified, with unspecified angina pectoris: Secondary | ICD-10-CM

## 2020-05-06 DIAGNOSIS — I10 Essential (primary) hypertension: Secondary | ICD-10-CM | POA: Diagnosis not present

## 2020-05-06 DIAGNOSIS — E782 Mixed hyperlipidemia: Secondary | ICD-10-CM | POA: Diagnosis not present

## 2020-05-06 DIAGNOSIS — E088 Diabetes mellitus due to underlying condition with unspecified complications: Secondary | ICD-10-CM

## 2020-05-06 DIAGNOSIS — N289 Disorder of kidney and ureter, unspecified: Secondary | ICD-10-CM

## 2020-05-06 NOTE — Patient Instructions (Signed)
Medication Instructions:  No medication changes. *If you need a refill on your cardiac medications before your next appointment, please call your pharmacy*   Lab Work: Your physician recommends that you return for lab work in: before your next visit. You need to have labs done when you are fasting.  You can come Monday through Friday 8:30 am to 12:00 pm and 1:15 to 4:30. You do not need to make an appointment as the order has already been placed. The labs you are going to have done are BMET, CBC, TSH, A1C, LFT and Lipids.   If you have labs (blood work) drawn today and your tests are completely normal, you will receive your results only by: Marland Kitchen MyChart Message (if you have MyChart) OR . A paper copy in the mail If you have any lab test that is abnormal or we need to change your treatment, we will call you to review the results.   Testing/Procedures: None ordered   Follow-Up: At [facility], you and your health needs are our priority.  As part of our continuing mission to provide you with exceptional heart care, we have created designated Provider Care Teams.  These Care Teams include your primary Cardiologist (physician) and Advanced Practice Providers (APPs -  Physician Assistants and Nurse Practitioners) who all work together to provide you with the care you need, when you need it.  We recommend signing up for the patient portal called "MyChart".  Sign up information is provided on this After Visit Summary.  MyChart is used to connect with patients for Virtual Visits (Telemedicine).  Patients are able to view lab/test results, encounter notes, upcoming appointments, etc.  Non-urgent messages can be sent to your provider as well.   To learn more about what you can do with MyChart, go to [email or web address].    Your next appointment:   6 month(s)  The format for your next appointment:   In Person  Provider:   Jyl Heinz, MD   Other Instructions NA

## 2020-05-06 NOTE — Progress Notes (Signed)
Cardiology Office Note:    Date:  05/06/2020   ID:  Glenn Roach, DOB 1946-01-27, MRN [id number]  PCP:  Mateo Flow, MD  Cardiologist:  Jenean Lindau, MD   Referring MD: Mateo Flow, MD    ASSESSMENT:    1. Coronary artery disease involving coronary bypass graft of native heart with angina pectoris ([place])   2. Essential hypertension   3. Diabetes mellitus due to underlying condition with unspecified complications ([place])   4. Mild renal insufficiency   5. Mixed dyslipidemia    PLAN:    In order of problems listed above:  1. Coronary artery disease: Secondary prevention stressed with the patient.  Importance of compliance with diet medication stressed any vocalized understanding.  Importance of regular exercise stressed and I told him to walk at least half an hour a day 5 days a week and he promises to do so. 2. Essential hypertension: Blood pressure stable and diet was emphasized. 3. Mixed dyslipidemia and diabetes mellitus: Diet emphasized.  I reviewed lipids with him at length and they are fine.  Diet was discussed for diabetes mellitus and his hemoglobin A1c is mildly elevated.  I cautioned him against this. 4. Mild renal insufficiency: Diet was emphasized.  This is followed by his primary care physician. 5. Abdominal obesity: I cautioned him about it.  Again I told him to exercise and ambulate and monitor his weight and diet.  He will discuss this more with his primary care physician. 6. Patient will be seen in follow-up appointment in 6 months or earlier if the patient has any concerns.  He will have complete blood work before his next visit.  Patient had multiple questions which were answered to satisfaction.   Medication Adjustments/Labs and Tests Ordered: Current medicines are reviewed at length with the patient today.  Concerns regarding medicines are outlined above.  No orders of the defined types were placed in this encounter.  No orders of the defined types  were placed in this encounter.    No chief complaint on file.    History of Present Illness:    Glenn Roach is a 74 y.o. male.  Patient has past medical history of coronary artery disease post CABG surgery, essential hypertension, dyslipidemia, diabetes mellitus and mild renal insufficiency.  He denies any problems at this time and takes care of activities of daily living.  No chest pain orthopnea or PND.  He leads a sedentary lifestyle.  He is not very compliant with exercise advice.  At the time of my evaluation, the patient is alert awake oriented and in no distress.  Past Medical History:  Diagnosis Date  . CAD (coronary artery disease)   . Chest tightness   . Coronary artery disease involving coronary bypass graft of native heart with angina pectoris ([place]) 10/28/2008   Qualifier: Diagnosis of  By: [name]    . Diabetes mellitus due to underlying condition with unspecified complications ([place]) 0/26/3785   Qualifier: Diagnosis of  By: [name]    . Dyslipidemia   . Essential hypertension 04/14/2015  . Mild renal insufficiency 04/14/2015  . Mixed dyslipidemia 01/19/2017  . NEPHROLITHIASIS, HX OF 10/28/2008   Qualifier: Diagnosis of  By: [name]    . Non morbid obesity due to excess calories 04/14/2015    Past Surgical History:  Procedure Laterality Date  . NO PAST SURGERIES      Current Medications: Current Meds  Medication Sig  . aspirin EC 81 MG  tablet Take 81 mg by mouth daily.   . clopidogrel (PLAVIX) 75 MG tablet TAKE 1 TABLET BY MOUTH DAILY  . Coenzyme Q-10 200 MG CAPS Take 200 mg by mouth daily.   Marland Kitchen ezetimibe (ZETIA) 10 MG tablet TAKE 1 TABLET BY MOUTH ONCE DAILY  . fenofibrate 160 MG tablet TAKE 1 TABLET BY MOUTH ONCE DAILY.  Marland Kitchen glimepiride (AMARYL) 2 MG tablet Take 2 mg by mouth daily with breakfast.   . isosorbide mononitrate (IMDUR) 30 MG 24 hr tablet TAKE 1 TABLET BY MOUTH ONCE DAILY.  Marland Kitchen LIVALO 4 MG TABS TAKE 1 TABLET BY MOUTH DAILY  .  Multiple Vitamins-Minerals (CENTRUM SILVER PO) Take by mouth.  . nitroGLYCERIN (NITROSTAT) 0.4 MG SL tablet Place 1 tablet (0.4 mg total) under the tongue every 5 (five) minutes as needed.  . pantoprazole (PROTONIX) 40 MG tablet Take 40 mg by mouth daily.   . ranolazine (RANEXA) 1000 MG SR tablet TAKE 1 TABLET BY MOUTH 2 TIMES DAILY  . tamsulosin (FLOMAX) 0.4 MG CAPS capsule Take 0.4 mg by mouth daily.  . TRULICITY 1.5 TD/3.2KG SOPN Inject 0.5 mLs into the skin once a week.     Allergies:   Morphine, Ramipril, Rosuvastatin, and Ticlopidine hcl   Social History   Socioeconomic History  . Marital status: Married    Spouse name: Not on file  . Number of children: Not on file  . Years of education: Not on file  . Highest education level: Not on file  Occupational History  . Not on file  Tobacco Use  . Smoking status: Former Research scientist (life sciences)  . Smokeless tobacco: Never Used  Vaping Use  . Vaping Use: Never used  Substance and Sexual Activity  . Alcohol use: No  . Drug use: No  . Sexual activity: Not on file  Other Topics Concern  . Not on file  Social History Narrative  . Not on file   Social Determinants of Health   Financial Resource Strain:   . Difficulty of Paying Living Expenses: Not on file  Food Insecurity:   . Worried About Charity fundraiser in the Last Year: Not on file  . Ran Out of Food in the Last Year: Not on file  Transportation Needs:   . Lack of Transportation (Medical): Not on file  . Lack of Transportation (Non-Medical): Not on file  Physical Activity:   . Days of Exercise per Week: Not on file  . Minutes of Exercise per Session: Not on file  Stress:   . Feeling of Stress : Not on file  Social Connections:   . Frequency of Communication with Friends and Family: Not on file  . Frequency of Social Gatherings with Friends and Family: Not on file  . Attends Religious Services: Not on file  . Active Member of Clubs or Organizations: Not on file  . Attends Theatre manager Meetings: Not on file  . Marital Status: Not on file     Family History: The patient's family history includes Heart attack in his father; Heart disease in his mother; Heart failure in his mother; Stroke in his father.  ROS:   Please see the history of present illness.    All other systems reviewed and are negative.  EKGs/Labs/Other Studies Reviewed:    The following studies were reviewed today: I discussed my findings with the patient including lab work   Recent Labs: 12/27/2019: ALT 16; BUN 26; Creatinine, Ser 1.60; Potassium 5.1; Sodium 143; TSH 1.650  01/02/2020: Magnesium 2.0  Recent Lipid Panel    Component Value Date/Time   CHOL 139 12/27/2019 0923   TRIG 148 12/27/2019 0923   HDL 46 12/27/2019 0923   CHOLHDL 3.0 12/27/2019 0923   CHOLHDL 5.3 09/08/2010 0415   VLDL UNABLE TO CALCULATE IF TRIGLYCERIDE OVER 400 mg/dL 09/08/2010 0415   LDLCALC 67 12/27/2019 0923    Physical Exam:    VS:  BP 112/72   Pulse 66   Ht 5\' 9"  (1.753 m)   Wt 193 lb 6.4 oz (87.7 kg)   SpO2 97%   BMI 28.56 kg/m     Wt Readings from Last 3 Encounters:  05/06/20 193 lb 6.4 oz (87.7 kg)  01/02/20 193 lb (87.5 kg)  06/27/19 195 lb 6.4 oz (88.6 kg)     GEN: Patient is in no acute distress HEENT: Normal NECK: No JVD; No carotid bruits LYMPHATICS: No lymphadenopathy CARDIAC: Hear sounds regular, 2/6 systolic murmur at the apex. RESPIRATORY:  Clear to auscultation without rales, wheezing or rhonchi  ABDOMEN: Soft, non-tender, non-distended MUSCULOSKELETAL:  No edema; No deformity  SKIN: Warm and dry NEUROLOGIC:  Alert and oriented x 3 PSYCHIATRIC:  Normal affect   Signed, Jenean Lindau, MD  05/06/2020 10:53 AM    [place]

## 2020-05-15 ENCOUNTER — Other Ambulatory Visit: Payer: Self-pay | Admitting: Cardiology

## 2020-05-26 ENCOUNTER — Other Ambulatory Visit: Payer: Self-pay | Admitting: Cardiology

## 2020-07-08 ENCOUNTER — Other Ambulatory Visit: Payer: Self-pay | Admitting: Cardiology

## 2020-07-08 NOTE — Telephone Encounter (Signed)
Refills sent to pharmacy. 

## 2020-07-24 ENCOUNTER — Other Ambulatory Visit: Payer: Self-pay | Admitting: Cardiology

## 2020-08-22 ENCOUNTER — Other Ambulatory Visit: Payer: Self-pay | Admitting: Cardiology

## 2020-08-24 NOTE — Telephone Encounter (Signed)
Rx refill sent to pharmacy. 

## 2020-09-20 ENCOUNTER — Other Ambulatory Visit: Payer: Self-pay | Admitting: Cardiology

## 2020-09-21 NOTE — Telephone Encounter (Signed)
Left a message to return my call. Will need to confirm pharmacy

## 2020-09-21 NOTE — Telephone Encounter (Signed)
Pharmacy updated and Imdur approved and sent

## 2020-10-07 DIAGNOSIS — D692 Other nonthrombocytopenic purpura: Secondary | ICD-10-CM | POA: Diagnosis not present

## 2020-10-07 DIAGNOSIS — D225 Melanocytic nevi of trunk: Secondary | ICD-10-CM | POA: Diagnosis not present

## 2020-10-07 DIAGNOSIS — L821 Other seborrheic keratosis: Secondary | ICD-10-CM | POA: Diagnosis not present

## 2020-10-07 DIAGNOSIS — D485 Neoplasm of uncertain behavior of skin: Secondary | ICD-10-CM | POA: Diagnosis not present

## 2020-10-19 ENCOUNTER — Other Ambulatory Visit: Payer: Self-pay | Admitting: Cardiology

## 2020-10-19 NOTE — Telephone Encounter (Signed)
Livalo approved and sent

## 2020-11-03 ENCOUNTER — Ambulatory Visit: Payer: Medicare Other | Admitting: Cardiology

## 2020-11-18 DIAGNOSIS — H524 Presbyopia: Secondary | ICD-10-CM | POA: Diagnosis not present

## 2020-11-18 DIAGNOSIS — E119 Type 2 diabetes mellitus without complications: Secondary | ICD-10-CM | POA: Diagnosis not present

## 2020-12-08 DIAGNOSIS — C4442 Squamous cell carcinoma of skin of scalp and neck: Secondary | ICD-10-CM | POA: Diagnosis not present

## 2020-12-08 DIAGNOSIS — L570 Actinic keratosis: Secondary | ICD-10-CM | POA: Diagnosis not present

## 2020-12-08 DIAGNOSIS — D485 Neoplasm of uncertain behavior of skin: Secondary | ICD-10-CM | POA: Diagnosis not present

## 2020-12-09 DIAGNOSIS — R079 Chest pain, unspecified: Secondary | ICD-10-CM

## 2020-12-09 MED ORDER — NITROGLYCERIN 0.4 MG SL SUBL: 0.4000 mg | SUBLINGUAL_TABLET | SUBLINGUAL | 3 refills | Status: DC | PRN

## 2020-12-17 DIAGNOSIS — N289 Disorder of kidney and ureter, unspecified: Secondary | ICD-10-CM | POA: Diagnosis not present

## 2020-12-17 DIAGNOSIS — I25709 Atherosclerosis of coronary artery bypass graft(s), unspecified, with unspecified angina pectoris: Secondary | ICD-10-CM | POA: Diagnosis not present

## 2020-12-17 DIAGNOSIS — I10 Essential (primary) hypertension: Secondary | ICD-10-CM | POA: Diagnosis not present

## 2020-12-17 DIAGNOSIS — E088 Diabetes mellitus due to underlying condition with unspecified complications: Secondary | ICD-10-CM | POA: Diagnosis not present

## 2020-12-17 NOTE — Addendum Note (Signed)
Addended by: Truddie Hidden on: 12/17/2020 10:57 AM   Modules accepted: Orders

## 2020-12-20 LAB — CBC WITH DIFFERENTIAL/PLATELET
Basophils Absolute: 0.1 10*3/uL (ref 0.0–0.2)
Basos: 1 %
EOS (ABSOLUTE): 0.1 10*3/uL (ref 0.0–0.4)
Eos: 2 %
Hematocrit: 47.3 % (ref 37.5–51.0)
Hemoglobin: 16.1 g/dL (ref 13.0–17.7)
Immature Grans (Abs): 0 10*3/uL (ref 0.0–0.1)
Immature Granulocytes: 0 %
Lymphocytes Absolute: 0.8 10*3/uL (ref 0.7–3.1)
Lymphs: 18 %
MCH: 31.6 pg (ref 26.6–33.0)
MCHC: 34 g/dL (ref 31.5–35.7)
MCV: 93 fL (ref 79–97)
Monocytes Absolute: 0.4 10*3/uL (ref 0.1–0.9)
Monocytes: 8 %
Neutrophils Absolute: 3.2 10*3/uL (ref 1.4–7.0)
Neutrophils: 71 %
Platelets: 198 10*3/uL (ref 150–450)
RBC: 5.1 x10E6/uL (ref 4.14–5.80)
RDW: 12.3 % (ref 11.6–15.4)
WBC: 4.5 10*3/uL (ref 3.4–10.8)

## 2020-12-20 LAB — LIPID PANEL
Chol/HDL Ratio: 3 ratio (ref 0.0–5.0)
Cholesterol, Total: 149 mg/dL (ref 100–199)
HDL: 50 mg/dL (ref >39)
LDL Chol Calc (NIH): 78 mg/dL (ref 0–99)
Triglycerides: 116 mg/dL (ref 0–149)
VLDL Cholesterol Cal: 21 mg/dL (ref 5–40)

## 2020-12-20 LAB — BASIC METABOLIC PANEL
BUN/Creatinine Ratio: 12 (ref 10–24)
BUN: 22 mg/dL (ref 8–27)
CO2: 20 mmol/L (ref 20–29)
Calcium: 10.1 mg/dL (ref 8.6–10.2)
Chloride: 104 mmol/L (ref 96–106)
Creatinine, Ser: 1.77 mg/dL — ABNORMAL HIGH (ref 0.76–1.27)
Glucose: 98 mg/dL (ref 65–99)
Potassium: 4.3 mmol/L (ref 3.5–5.2)
Sodium: 139 mmol/L (ref 134–144)
eGFR: 39 mL/min/{1.73_m2} — ABNORMAL LOW (ref >59)

## 2020-12-20 LAB — HEPATIC FUNCTION PANEL
ALT: 19 IU/L (ref 0–44)
AST: 18 IU/L (ref 0–40)
Albumin: 4.5 g/dL (ref 3.7–4.7)
Alkaline Phosphatase: 45 IU/L (ref 44–121)
Bilirubin Total: 0.6 mg/dL (ref 0.0–1.2)
Bilirubin, Direct: 0.27 mg/dL (ref 0.00–0.40)
Total Protein: 6.9 g/dL (ref 6.0–8.5)

## 2020-12-20 LAB — HEMOGLOBIN A1C
Est. average glucose Bld gHb Est-mCnc: 146 mg/dL
Hgb A1c MFr Bld: 6.7 % — ABNORMAL HIGH (ref 4.8–5.6)

## 2020-12-20 LAB — TSH: TSH: 1.75 u[IU]/mL (ref 0.450–4.500)

## 2020-12-22 ENCOUNTER — Other Ambulatory Visit: Payer: Self-pay

## 2020-12-22 ENCOUNTER — Ambulatory Visit (INDEPENDENT_AMBULATORY_CARE_PROVIDER_SITE_OTHER): Payer: Medicare Other | Admitting: Cardiology

## 2020-12-22 ENCOUNTER — Encounter: Payer: Self-pay | Admitting: Cardiology

## 2020-12-22 VITALS — BP 120/60 | HR 76 | Ht 69.0 in | Wt 191.6 lb

## 2020-12-22 DIAGNOSIS — E785 Hyperlipidemia, unspecified: Secondary | ICD-10-CM

## 2020-12-22 DIAGNOSIS — I10 Essential (primary) hypertension: Secondary | ICD-10-CM

## 2020-12-22 DIAGNOSIS — N289 Disorder of kidney and ureter, unspecified: Secondary | ICD-10-CM | POA: Diagnosis not present

## 2020-12-22 DIAGNOSIS — E088 Diabetes mellitus due to underlying condition with unspecified complications: Secondary | ICD-10-CM

## 2020-12-22 DIAGNOSIS — I25709 Atherosclerosis of coronary artery bypass graft(s), unspecified, with unspecified angina pectoris: Secondary | ICD-10-CM

## 2020-12-22 DIAGNOSIS — E782 Mixed hyperlipidemia: Secondary | ICD-10-CM | POA: Diagnosis not present

## 2020-12-22 NOTE — Progress Notes (Signed)
Cardiology Office Note:    Date:  12/22/2020   ID:  Glenn Roach, DOB 08-04-1945, MRN [id number]  PCP:  Mateo Flow, MD  Cardiologist:  Jenean Lindau, MD   Referring MD: Mateo Flow, MD    ASSESSMENT:    1. Coronary artery disease involving coronary bypass graft of native heart with angina pectoris ([place])   2. Essential hypertension   3. Diabetes mellitus due to underlying condition with unspecified complications ([place])   4. Dyslipidemia   5. Mild renal insufficiency   6. Mixed dyslipidemia    PLAN:    In order of problems listed above:  Coronary artery disease: Secondary prevention stressed with the patient.  Importance of compliance with diet medication stressed any vocalized understanding.  He was advised to walk at least half an hour a day 5 days a week and he tells me that he is motivated to do so now. Essential hypertension: Blood pressure stable and diet was emphasized. Mixed dyslipidemia and diabetes mellitus: I reviewed lab work that the patient brought with him.  His lab work is essentially stable.  LDL needs to be better and he is going to try harder with diet and exercise  Renal insufficiency: Stable and I discussed this with the patient at length. Patient will be seen in follow-up appointment in 6 months or earlier if the patient has any concerns    Medication Adjustments/Labs and Tests Ordered: Current medicines are reviewed at length with the patient today.  Concerns regarding medicines are outlined above.  No orders of the defined types were placed in this encounter.  No orders of the defined types were placed in this encounter.    No chief complaint on file.    History of Present Illness:    Glenn Roach is a 75 y.o. male.  Patient has past medical history of coronary artery disease, essential hypertension, dyslipidemia, diabetes mellitus, renal insufficiency.  He denies any problems at this time and takes care of activities of daily living.   No chest pain orthopnea or PND.  He tells me that once a few months he has chest pain which is relieved with nitroglycerin.  Unfortunately he leads a sedentary lifestyle.  At the time of my evaluation, the patient is alert awake oriented and in no distress.  Past Medical History:  Diagnosis Date   CAD (coronary artery disease)    Chest tightness    Coronary artery disease involving coronary bypass graft of native heart with angina pectoris ([place]) 10/28/2008   Qualifier: Diagnosis of  By: [name]     Diabetes mellitus due to underlying condition with unspecified complications ([place]) 2/42/6834   Qualifier: Diagnosis of  By: [name]     Dyslipidemia    Essential hypertension 04/14/2015   Mild renal insufficiency 04/14/2015   Mixed dyslipidemia 01/19/2017   NEPHROLITHIASIS, HX OF 10/28/2008   Qualifier: Diagnosis of  By: [name]     Non morbid obesity due to excess calories 04/14/2015    Past Surgical History:  Procedure Laterality Date   NO PAST SURGERIES      Current Medications: Current Meds  Medication Sig   aspirin EC 81 MG tablet Take 81 mg by mouth daily.    clopidogrel (PLAVIX) 75 MG tablet Take 75 mg by mouth daily.   Coenzyme Q-10 200 MG CAPS Take 200 mg by mouth daily.    ezetimibe (ZETIA) 10 MG tablet Take 10 mg by mouth daily.   fenofibrate 160  MG tablet Take 160 mg by mouth daily.   glimepiride (AMARYL) 2 MG tablet Take 2 mg by mouth daily with breakfast.    isosorbide mononitrate (IMDUR) 30 MG 24 hr tablet Take 30 mg by mouth daily.   metoprolol tartrate (LOPRESSOR) 25 MG tablet Take 12.5 mg by mouth 2 (two) times daily.   Multiple Vitamins-Minerals (CENTRUM SILVER PO) Take 1 capsule by mouth daily.   nitroGLYCERIN (NITROSTAT) 0.4 MG SL tablet Place 0.4 mg under the tongue every 5 (five) minutes as needed for chest pain.   Omega-3 Fatty Acids (FISH OIL TRIPLE STRENGTH) 1400 MG CAPS Take 1,400 mg by mouth daily.   pantoprazole (PROTONIX) 40 MG tablet Take  40 mg by mouth daily.    Pitavastatin Calcium 4 MG TABS Take 4 mg by mouth daily.   ranolazine (RANEXA) 1000 MG SR tablet Take 1,000 mg by mouth 2 (two) times daily.   tamsulosin (FLOMAX) 0.4 MG CAPS capsule Take 0.4 mg by mouth daily.   TRULICITY 1.5 WG/9.5AO SOPN Inject 1.5 mLs into the skin once a week.     Allergies:   Morphine, Ramipril, Rosuvastatin, and Ticlopidine hcl   Social History   Socioeconomic History   Marital status: Married    Spouse name: Not on file   Number of children: Not on file   Years of education: Not on file   Highest education level: Not on file  Occupational History   Not on file  Tobacco Use   Smoking status: Former    Pack years: 0.00   Smokeless tobacco: Never  Vaping Use   Vaping Use: Never used  Substance and Sexual Activity   Alcohol use: No   Drug use: No   Sexual activity: Not on file  Other Topics Concern   Not on file  Social History Narrative   Not on file   Social Determinants of Health   Financial Resource Strain: Not on file  Food Insecurity: Not on file  Transportation Needs: Not on file  Physical Activity: Not on file  Stress: Not on file  Social Connections: Not on file     Family History: The patient's family history includes Heart attack in his father; Heart disease in his mother; Heart failure in his mother; Stroke in his father.  ROS:   Please see the history of present illness.    All other systems reviewed and are negative.  EKGs/Labs/Other Studies Reviewed:    The following studies were reviewed today: I discussed my findings with the patient in extensive length.   Recent Labs: 01/02/2020: Magnesium 2.0 12/17/2020: ALT 19; BUN 22; Creatinine, Ser 1.77; Hemoglobin 16.1; Platelets 198; Potassium 4.3; Sodium 139; TSH 1.750  Recent Lipid Panel    Component Value Date/Time   CHOL 149 12/17/2020 1102   TRIG 116 12/17/2020 1102   HDL 50 12/17/2020 1102   CHOLHDL 3.0 12/17/2020 1102   CHOLHDL 5.3 09/08/2010  0415   VLDL UNABLE TO CALCULATE IF TRIGLYCERIDE OVER 400 mg/dL 09/08/2010 0415   LDLCALC 78 12/17/2020 1102    Physical Exam:    VS:  BP 120/60   Pulse 76   Ht 5\' 9"  (1.753 m)   Wt 191 lb 9.6 oz (86.9 kg)   SpO2 94%   BMI 28.29 kg/m     Wt Readings from Last 3 Encounters:  12/22/20 191 lb 9.6 oz (86.9 kg)  05/06/20 193 lb 6.4 oz (87.7 kg)  01/02/20 193 lb (87.5 kg)     GEN: Patient  is in no acute distress HEENT: Normal NECK: No JVD; No carotid bruits LYMPHATICS: No lymphadenopathy CARDIAC: Hear sounds regular, 2/6 systolic murmur at the apex. RESPIRATORY:  Clear to auscultation without rales, wheezing or rhonchi  ABDOMEN: Soft, non-tender, non-distended MUSCULOSKELETAL:  No edema; No deformity  SKIN: Warm and dry NEUROLOGIC:  Alert and oriented x 3 PSYCHIATRIC:  Normal affect   Signed, Jenean Lindau, MD  12/22/2020 2:19 PM    [facility]

## 2020-12-22 NOTE — Patient Instructions (Signed)

## 2021-01-07 DIAGNOSIS — I10 Essential (primary) hypertension: Secondary | ICD-10-CM | POA: Diagnosis not present

## 2021-01-07 DIAGNOSIS — E785 Hyperlipidemia, unspecified: Secondary | ICD-10-CM | POA: Diagnosis not present

## 2021-01-13 DIAGNOSIS — N183 Chronic kidney disease, stage 3 unspecified: Secondary | ICD-10-CM | POA: Diagnosis not present

## 2021-01-13 DIAGNOSIS — E782 Mixed hyperlipidemia: Secondary | ICD-10-CM | POA: Diagnosis not present

## 2021-01-13 DIAGNOSIS — E669 Obesity, unspecified: Secondary | ICD-10-CM | POA: Diagnosis not present

## 2021-01-13 DIAGNOSIS — E1169 Type 2 diabetes mellitus with other specified complication: Secondary | ICD-10-CM | POA: Diagnosis not present

## 2021-01-14 ENCOUNTER — Other Ambulatory Visit: Payer: Self-pay | Admitting: Cardiology

## 2021-02-06 ENCOUNTER — Other Ambulatory Visit: Payer: Self-pay | Admitting: Cardiology

## 2021-02-08 NOTE — Telephone Encounter (Signed)
Rx sent 

## 2021-03-10 ENCOUNTER — Other Ambulatory Visit: Payer: Self-pay | Admitting: Cardiology

## 2021-03-10 DIAGNOSIS — Z85828 Personal history of other malignant neoplasm of skin: Secondary | ICD-10-CM | POA: Diagnosis not present

## 2021-03-10 DIAGNOSIS — D485 Neoplasm of uncertain behavior of skin: Secondary | ICD-10-CM | POA: Diagnosis not present

## 2021-03-10 DIAGNOSIS — I10 Essential (primary) hypertension: Secondary | ICD-10-CM | POA: Diagnosis not present

## 2021-03-10 DIAGNOSIS — L570 Actinic keratosis: Secondary | ICD-10-CM | POA: Diagnosis not present

## 2021-03-10 DIAGNOSIS — C4441 Basal cell carcinoma of skin of scalp and neck: Secondary | ICD-10-CM | POA: Diagnosis not present

## 2021-03-10 DIAGNOSIS — E785 Hyperlipidemia, unspecified: Secondary | ICD-10-CM | POA: Diagnosis not present

## 2021-03-14 ENCOUNTER — Other Ambulatory Visit: Payer: Self-pay | Admitting: Cardiology

## 2021-03-16 NOTE — Telephone Encounter (Signed)
Fenofibrate 160 mg # 90 x 3 refills sent to  [facility] [place], [place]

## 2021-03-23 DIAGNOSIS — S62245A Nondisplaced fracture of shaft of first metacarpal bone, left hand, initial encounter for closed fracture: Secondary | ICD-10-CM | POA: Diagnosis not present

## 2021-03-26 DIAGNOSIS — S62235A Other nondisplaced fracture of base of first metacarpal bone, left hand, initial encounter for closed fracture: Secondary | ICD-10-CM | POA: Diagnosis not present

## 2021-03-28 ENCOUNTER — Other Ambulatory Visit: Payer: Self-pay | Admitting: Cardiology

## 2021-04-02 ENCOUNTER — Telehealth: Payer: Self-pay | Admitting: Cardiology

## 2021-04-02 DIAGNOSIS — S62232A Other displaced fracture of base of first metacarpal bone, left hand, initial encounter for closed fracture: Secondary | ICD-10-CM | POA: Diagnosis not present

## 2021-04-02 NOTE — Telephone Encounter (Signed)
   Name: Glenn Roach  DOB: 11/14/1945  MRN: [id number]   Primary Cardiologist: None  Chart reviewed as part of pre-operative protocol coverage. Patient was contacted 04/02/2021 in reference to pre-operative risk assessment for pending surgery as outlined below.  Glenn Roach was last seen on 12/22/2020 by Glenn Roach.  Since that day, Glenn Roach has done well without any exertional chest pain or worsening dyspnea.   Therefore, based on ACC/AHA guidelines, the patient would be at acceptable risk for the planned procedure without further cardiovascular testing.   I will discussed with on-call MD regarding Plavix holding.  Patient states his last dose of Plavix is this morning, we discussed the cardiac clearance request this morning as well, there is only 2 days between now and the date of the surgery.  I left a message with Dr. Towanda Octave nurse to make sure Dr. Donivan Scull is also okay with a shorter holding time of Plavix prior to surgery.  He will need to restart Plavix as soon as possible afterward.  The patient was advised that if he develops new symptoms prior to surgery to contact our office to arrange for a follow-up visit, and he verbalized understanding.  I will route this recommendation to the requesting party via Epic fax function and remove from pre-op pool. Please call with questions.  [place], [place] 04/02/2021, 2:15 PM

## 2021-04-02 NOTE — Telephone Encounter (Signed)
   [facility] Pre-operative Risk Assessment    Patient Name: Glenn Roach  DOB: [birth year] MRN: [id number]  HEARTCARE STAFF:  - IMPORTANT!!!!!! Under Visit Info/Reason for Call, type in Other and utilize the format Clearance MM/DD/YY or Clearance TBD. Do not use dashes or single digits. - Please review there is not already an duplicate clearance open for this procedure. - If request is for dental extraction, please clarify the # of teeth to be extracted. - If the patient is currently at the dentist's office, call Pre-Op Callback Staff (MA/nurse) to input urgent request.  - If the patient is not currently in the dentist office, please route to the Pre-Op pool.  Request for surgical clearance:  What type of surgery is being performed? ORIS of the first left Metacarpal  When is this surgery scheduled?  04-05-21  What type of clearance is required (medical clearance vs. Pharmacy clearance to hold med vs. Both)? Medicine  Are there any medications that need to be held prior to surgery and how long? Plavix  Practice name and name of physician performing surgery? Dr Donivan Scull  What is the office phone number? [phone]   7.   What is the office fax number? [phone]  8.   Anesthesia type (None, local, MAC, general) ? Light Sedation and Nerve block   Glyn Ade 04/02/2021, 12:39 PM  _________________________________________________________________   (provider comments below)

## 2021-04-02 NOTE — Telephone Encounter (Signed)
I spoke with Dr. Sallyanne Kuster, DOD, patient is cleared to proceed with surgery and hold Plavix up to 5 days.  I have left a message with requesting provider's office to inform them there is not enough holding time, however technically this is a very low bleeding risk surgery anyway.  Will defer the ultimate decision to the surgeon's office to see if he is willing to do the surgery after only 2 days of Plavix holding time

## 2021-04-05 DIAGNOSIS — S62232A Other displaced fracture of base of first metacarpal bone, left hand, initial encounter for closed fracture: Secondary | ICD-10-CM | POA: Diagnosis not present

## 2021-04-05 DIAGNOSIS — Z951 Presence of aortocoronary bypass graft: Secondary | ICD-10-CM | POA: Diagnosis not present

## 2021-04-05 DIAGNOSIS — S62202A Unspecified fracture of first metacarpal bone, left hand, initial encounter for closed fracture: Secondary | ICD-10-CM | POA: Diagnosis not present

## 2021-04-05 DIAGNOSIS — G8918 Other acute postprocedural pain: Secondary | ICD-10-CM | POA: Diagnosis not present

## 2021-04-05 DIAGNOSIS — Z7902 Long term (current) use of antithrombotics/antiplatelets: Secondary | ICD-10-CM | POA: Diagnosis not present

## 2021-04-05 DIAGNOSIS — E119 Type 2 diabetes mellitus without complications: Secondary | ICD-10-CM | POA: Diagnosis not present

## 2021-04-05 DIAGNOSIS — Z79899 Other long term (current) drug therapy: Secondary | ICD-10-CM | POA: Diagnosis not present

## 2021-04-05 DIAGNOSIS — Z87891 Personal history of nicotine dependence: Secondary | ICD-10-CM | POA: Diagnosis not present

## 2021-04-05 DIAGNOSIS — I10 Essential (primary) hypertension: Secondary | ICD-10-CM | POA: Diagnosis not present

## 2021-04-09 DIAGNOSIS — I10 Essential (primary) hypertension: Secondary | ICD-10-CM | POA: Diagnosis not present

## 2021-04-09 DIAGNOSIS — E7800 Pure hypercholesterolemia, unspecified: Secondary | ICD-10-CM | POA: Diagnosis not present

## 2021-04-18 ENCOUNTER — Other Ambulatory Visit: Payer: Self-pay | Admitting: Cardiology

## 2021-04-19 DIAGNOSIS — S62232A Other displaced fracture of base of first metacarpal bone, left hand, initial encounter for closed fracture: Secondary | ICD-10-CM | POA: Diagnosis not present

## 2021-04-29 DIAGNOSIS — I25709 Atherosclerosis of coronary artery bypass graft(s), unspecified, with unspecified angina pectoris: Secondary | ICD-10-CM

## 2021-04-29 DIAGNOSIS — E088 Diabetes mellitus due to underlying condition with unspecified complications: Secondary | ICD-10-CM

## 2021-04-29 DIAGNOSIS — I10 Essential (primary) hypertension: Secondary | ICD-10-CM

## 2021-04-29 DIAGNOSIS — E782 Mixed hyperlipidemia: Secondary | ICD-10-CM

## 2021-04-29 DIAGNOSIS — N289 Disorder of kidney and ureter, unspecified: Secondary | ICD-10-CM

## 2021-05-10 DIAGNOSIS — I10 Essential (primary) hypertension: Secondary | ICD-10-CM | POA: Diagnosis not present

## 2021-05-10 DIAGNOSIS — D510 Vitamin B12 deficiency anemia due to intrinsic factor deficiency: Secondary | ICD-10-CM | POA: Diagnosis not present

## 2021-05-10 DIAGNOSIS — I259 Chronic ischemic heart disease, unspecified: Secondary | ICD-10-CM | POA: Diagnosis not present

## 2021-05-10 DIAGNOSIS — M109 Gout, unspecified: Secondary | ICD-10-CM | POA: Diagnosis not present

## 2021-05-17 DIAGNOSIS — S62232A Other displaced fracture of base of first metacarpal bone, left hand, initial encounter for closed fracture: Secondary | ICD-10-CM | POA: Diagnosis not present

## 2021-05-27 ENCOUNTER — Other Ambulatory Visit: Payer: Self-pay | Admitting: Cardiology

## 2021-06-15 DIAGNOSIS — E782 Mixed hyperlipidemia: Secondary | ICD-10-CM | POA: Diagnosis not present

## 2021-06-15 DIAGNOSIS — I10 Essential (primary) hypertension: Secondary | ICD-10-CM | POA: Diagnosis not present

## 2021-06-15 DIAGNOSIS — N289 Disorder of kidney and ureter, unspecified: Secondary | ICD-10-CM | POA: Diagnosis not present

## 2021-06-15 DIAGNOSIS — E088 Diabetes mellitus due to underlying condition with unspecified complications: Secondary | ICD-10-CM | POA: Diagnosis not present

## 2021-06-15 DIAGNOSIS — I25709 Atherosclerosis of coronary artery bypass graft(s), unspecified, with unspecified angina pectoris: Secondary | ICD-10-CM | POA: Diagnosis not present

## 2021-06-15 DIAGNOSIS — Z23 Encounter for immunization: Secondary | ICD-10-CM | POA: Diagnosis not present

## 2021-06-16 LAB — BASIC METABOLIC PANEL
BUN/Creatinine Ratio: 15 (ref 10–24)
BUN: 29 mg/dL — ABNORMAL HIGH (ref 8–27)
CO2: 23 mmol/L (ref 20–29)
Calcium: 9.3 mg/dL (ref 8.6–10.2)
Chloride: 105 mmol/L (ref 96–106)
Creatinine, Ser: 2 mg/dL — ABNORMAL HIGH (ref 0.76–1.27)
Glucose: 122 mg/dL — ABNORMAL HIGH (ref 70–99)
Potassium: 4.8 mmol/L (ref 3.5–5.2)
Sodium: 143 mmol/L (ref 134–144)
eGFR: 34 mL/min/{1.73_m2} — ABNORMAL LOW (ref >59)

## 2021-06-16 LAB — HEPATIC FUNCTION PANEL
ALT: 16 IU/L (ref 0–44)
AST: 22 IU/L (ref 0–40)
Albumin: 4.5 g/dL (ref 3.7–4.7)
Alkaline Phosphatase: 46 IU/L (ref 44–121)
Bilirubin Total: 0.5 mg/dL (ref 0.0–1.2)
Bilirubin, Direct: 0.23 mg/dL (ref 0.00–0.40)
Total Protein: 6.4 g/dL (ref 6.0–8.5)

## 2021-06-16 LAB — CBC WITH DIFFERENTIAL/PLATELET
Basophils Absolute: 0 10*3/uL (ref 0.0–0.2)
Basos: 1 %
EOS (ABSOLUTE): 0.1 10*3/uL (ref 0.0–0.4)
Eos: 3 %
Hematocrit: 43.9 % (ref 37.5–51.0)
Hemoglobin: 14.8 g/dL (ref 13.0–17.7)
Immature Grans (Abs): 0 10*3/uL (ref 0.0–0.1)
Immature Granulocytes: 1 %
Lymphocytes Absolute: 0.9 10*3/uL (ref 0.7–3.1)
Lymphs: 18 %
MCH: 30.8 pg (ref 26.6–33.0)
MCHC: 33.7 g/dL (ref 31.5–35.7)
MCV: 91 fL (ref 79–97)
Monocytes Absolute: 0.4 10*3/uL (ref 0.1–0.9)
Monocytes: 8 %
Neutrophils Absolute: 3.3 10*3/uL (ref 1.4–7.0)
Neutrophils: 69 %
Platelets: 175 10*3/uL (ref 150–450)
RBC: 4.81 x10E6/uL (ref 4.14–5.80)
RDW: 12.3 % (ref 11.6–15.4)
WBC: 4.8 10*3/uL (ref 3.4–10.8)

## 2021-06-16 LAB — TSH: TSH: 1.84 u[IU]/mL (ref 0.450–4.500)

## 2021-06-16 LAB — LIPID PANEL
Chol/HDL Ratio: 2.9 ratio (ref 0.0–5.0)
Cholesterol, Total: 146 mg/dL (ref 100–199)
HDL: 51 mg/dL (ref >39)
LDL Chol Calc (NIH): 75 mg/dL (ref 0–99)
Triglycerides: 108 mg/dL (ref 0–149)
VLDL Cholesterol Cal: 20 mg/dL (ref 5–40)

## 2021-06-17 ENCOUNTER — Other Ambulatory Visit: Payer: Self-pay

## 2021-06-17 ENCOUNTER — Ambulatory Visit (INDEPENDENT_AMBULATORY_CARE_PROVIDER_SITE_OTHER): Payer: Medicare Other | Admitting: Cardiology

## 2021-06-17 ENCOUNTER — Encounter: Payer: Self-pay | Admitting: Cardiology

## 2021-06-17 VITALS — BP 146/58 | HR 71 | Ht 69.0 in | Wt 192.8 lb

## 2021-06-17 DIAGNOSIS — I25709 Atherosclerosis of coronary artery bypass graft(s), unspecified, with unspecified angina pectoris: Secondary | ICD-10-CM

## 2021-06-17 MED ORDER — RANOLAZINE ER 500 MG PO TB12: 500.0000 mg | ORAL_TABLET | Freq: Two times a day (BID) | ORAL | 3 refills | Status: DC

## 2021-06-17 NOTE — Progress Notes (Signed)
Cardiology Office Note:    Date:  06/17/2021   ID:  Glenn Roach, DOB 1946-05-30, MRN [id number]  PCP:  Glenn Flow, MD  Cardiologist:  Glenn Lindau, MD   Referring MD: Glenn Flow, MD    ASSESSMENT:    No diagnosis found. PLAN:    In order of problems listed above:  Coronary artery disease: Stable at this time.  Secondary prevention stressed.  Importance of compliance with diet medication stressed and vocalized understanding.  The disadvantages and issues related to health with a sedentary lifestyle and stressed again to him and he understands. Essential hypertension: Blood pressure stable and diet was emphasized.  Lifestyle modification urged.  Weight reduction stressed and he promises to do better. Mixed dyslipidemia and diabetes mellitus: Lipids were reviewed he promises to do better with diet.  He tells me that he has been somewhat noncompliant with his eating habits. Patient will be seen in follow-up appointment in 6 months or earlier if the patient has any concerns    Medication Adjustments/Labs and Tests Ordered: Current medicines are reviewed at length with the patient today.  Concerns regarding medicines are outlined above.  No orders of the defined types were placed in this encounter.  No orders of the defined types were placed in this encounter.    No chief complaint on file.    History of Present Illness:    Glenn Roach is a 74 y.o. male.  Patient has past medical history of coronary artery disease, essential hypertension, dyslipidemia and diabetes mellitus.  He denies any problems at this time and takes care of activities of daily living.  No chest pain orthopnea or PND.  He leads a sedentary lifestyle.  He mentions to me that walking gives him low back pain.  At the time of my evaluation, the patient is alert awake oriented and in no distress.  Past Medical History:  Diagnosis Date   CAD (coronary artery disease)    Chest tightness     Coronary artery disease involving coronary bypass graft of native heart with angina pectoris (Glenn Roach) 10/28/2008   Qualifier: Diagnosis of  By: Glenn Roach     Diabetes mellitus due to underlying condition with unspecified complications (Glenn Roach) 0/94/7096   Qualifier: Diagnosis of  By: Glenn Roach     Dyslipidemia    Essential hypertension 04/14/2015   Mild renal insufficiency 04/14/2015   Mixed dyslipidemia 01/19/2017   NEPHROLITHIASIS, HX OF 10/28/2008   Qualifier: Diagnosis of  By: Glenn Roach     Non morbid obesity due to excess calories 04/14/2015    Past Surgical History:  Procedure Laterality Date   NO PAST SURGERIES      Current Medications: Current Meds  Medication Sig   aspirin EC 81 MG tablet Take 81 mg by mouth daily.    clopidogrel (PLAVIX) 75 MG tablet TAKE 1 TABLET BY MOUTH DAILY   Coenzyme Q-10 200 MG CAPS Take 200 mg by mouth daily.    ezetimibe (ZETIA) 10 MG tablet TAKE 1 TABLET BY MOUTH EVERY DAY   fenofibrate 160 MG tablet TAKE 1 TABLET BY MOUTH ONCE DAILY   glimepiride (AMARYL) 2 MG tablet Take 2 mg by mouth daily with breakfast.    isosorbide mononitrate (IMDUR) 30 MG 24 hr tablet TAKE 1 TABLET BY MOUTH EVERY DAY   LIVALO 4 MG TABS TAKE 1 TABLET BY MOUTH DAILY   metoprolol tartrate (LOPRESSOR) 25 MG tablet TAKE ONE-HALF TABLET BY MOUTH TWICE DAILY  Multiple Vitamins-Minerals (CENTRUM SILVER PO) Take 1 capsule by mouth daily.   nitroGLYCERIN (NITROSTAT) 0.4 MG SL tablet Place 0.4 mg under the tongue every 5 (five) minutes as needed for chest pain.   Omega-3 Fatty Acids (FISH OIL TRIPLE STRENGTH) 1400 MG CAPS Take 1,400 mg by mouth daily.   pantoprazole (PROTONIX) 40 MG tablet Take 40 mg by mouth daily.    ranolazine (RANEXA) 1000 MG SR tablet TAKE 1 TABLET BY MOUTH TWICE DAILY   tamsulosin (FLOMAX) 0.4 MG CAPS capsule Take 0.4 mg by mouth daily.   TRULICITY 1.5 ZO/1.0RU SOPN Inject 1.5 mLs into the skin once a week.     Allergies:   Morphine, Ramipril,  Rosuvastatin, and Ticlopidine hcl   Social History   Socioeconomic History   Marital status: Married    Spouse name: Not on file   Number of children: Not on file   Years of education: Not on file   Highest education level: Not on file  Occupational History   Not on file  Tobacco Use   Smoking status: Former   Smokeless tobacco: Never  Vaping Use   Vaping Use: Never used  Substance and Sexual Activity   Alcohol use: No   Drug use: No   Sexual activity: Not on file  Other Topics Concern   Not on file  Social History Narrative   Not on file   Social Determinants of Health   Financial Resource Strain: Not on file  Food Insecurity: Not on file  Transportation Needs: Not on file  Physical Activity: Not on file  Stress: Not on file  Social Connections: Not on file     Family History: The patient's family history includes Heart attack in his father; Heart disease in his mother; Heart failure in his mother; Stroke in his father.  ROS:   Please see the history of present illness.    All other systems reviewed and are negative.  EKGs/Labs/Other Studies Reviewed:    The following studies were reviewed today: EKG reveals sinus rhythm first-degree AV block and left ventricular hypertrophy   Recent Labs: 06/15/2021: ALT 16; BUN 29; Creatinine, Ser 2.00; Hemoglobin 14.8; Platelets 175; Potassium 4.8; Sodium 143; TSH 1.840  Recent Lipid Panel    Component Value Date/Time   CHOL 146 06/15/2021 1044   TRIG 108 06/15/2021 1044   HDL 51 06/15/2021 1044   CHOLHDL 2.9 06/15/2021 1044   CHOLHDL 5.3 09/08/2010 0415   VLDL UNABLE TO CALCULATE IF TRIGLYCERIDE OVER 400 mg/dL 09/08/2010 0415   LDLCALC 75 06/15/2021 1044    Physical Exam:    VS:  BP (!) 146/58   Pulse 71   Ht 5\' 9"  (1.753 m)   Wt 192 lb 12.8 oz (87.5 kg)   SpO2 96%   BMI 28.47 kg/m     Wt Readings from Last 3 Encounters:  06/17/21 192 lb 12.8 oz (87.5 kg)  12/22/20 191 lb 9.6 oz (86.9 kg)  05/06/20 193  lb 6.4 oz (87.7 kg)     GEN: Patient is in no acute distress HEENT: Normal NECK: No JVD; No carotid bruits LYMPHATICS: No lymphadenopathy CARDIAC: Hear sounds regular, 2/6 systolic murmur at the apex. RESPIRATORY:  Clear to auscultation without rales, wheezing or rhonchi  ABDOMEN: Soft, non-tender, non-distended MUSCULOSKELETAL:  No edema; No deformity  SKIN: Warm and dry NEUROLOGIC:  Alert and oriented x 3 PSYCHIATRIC:  Normal affect   Signed, Glenn Lindau, MD  06/17/2021 2:11 PM    [facility]

## 2021-06-17 NOTE — Addendum Note (Signed)
Addended by: Truddie Hidden on: 06/17/2021 03:27 PM   Modules accepted: Orders

## 2021-06-17 NOTE — Patient Instructions (Signed)

## 2021-07-20 ENCOUNTER — Encounter: Payer: Self-pay | Admitting: Cardiology

## 2021-07-21 DIAGNOSIS — R3121 Asymptomatic microscopic hematuria: Secondary | ICD-10-CM | POA: Diagnosis not present

## 2021-07-21 DIAGNOSIS — N3941 Urge incontinence: Secondary | ICD-10-CM | POA: Diagnosis not present

## 2021-07-21 DIAGNOSIS — N200 Calculus of kidney: Secondary | ICD-10-CM | POA: Diagnosis not present

## 2021-07-21 DIAGNOSIS — N401 Enlarged prostate with lower urinary tract symptoms: Secondary | ICD-10-CM | POA: Diagnosis not present

## 2021-07-21 MED ORDER — FENOFIBRATE 160 MG PO TABS: 160.0000 mg | ORAL_TABLET | Freq: Every day | ORAL | 2 refills | Status: DC

## 2021-07-21 MED ORDER — NITROGLYCERIN 0.4 MG SL SUBL: 0.4000 mg | SUBLINGUAL_TABLET | SUBLINGUAL | 10 refills | Status: DC | PRN

## 2021-07-21 MED ORDER — METOPROLOL TARTRATE 25 MG PO TABS: 12.5000 mg | ORAL_TABLET | Freq: Two times a day (BID) | ORAL | 2 refills | Status: DC

## 2021-07-21 MED ORDER — ISOSORBIDE MONONITRATE ER 30 MG PO TB24: 30.0000 mg | ORAL_TABLET | Freq: Every day | ORAL | 2 refills | Status: DC

## 2021-07-21 MED ORDER — RANOLAZINE ER 500 MG PO TB12: 500.0000 mg | ORAL_TABLET | Freq: Two times a day (BID) | ORAL | 2 refills | Status: DC

## 2021-07-21 MED ORDER — LIVALO 4 MG PO TABS: 1.0000 | ORAL_TABLET | Freq: Every day | ORAL | 2 refills | Status: DC

## 2021-07-21 MED ORDER — CLOPIDOGREL BISULFATE 75 MG PO TABS: 75.0000 mg | ORAL_TABLET | Freq: Every day | ORAL | 2 refills | Status: DC

## 2021-07-21 MED ORDER — EZETIMIBE 10 MG PO TABS: 10.0000 mg | ORAL_TABLET | Freq: Every day | ORAL | 2 refills | Status: DC

## 2021-07-29 DIAGNOSIS — N281 Cyst of kidney, acquired: Secondary | ICD-10-CM | POA: Diagnosis not present

## 2021-07-29 DIAGNOSIS — R3121 Asymptomatic microscopic hematuria: Secondary | ICD-10-CM | POA: Diagnosis not present

## 2021-07-29 DIAGNOSIS — K8020 Calculus of gallbladder without cholecystitis without obstruction: Secondary | ICD-10-CM | POA: Diagnosis not present

## 2021-07-29 DIAGNOSIS — N200 Calculus of kidney: Secondary | ICD-10-CM | POA: Diagnosis not present

## 2021-07-29 DIAGNOSIS — K5730 Diverticulosis of large intestine without perforation or abscess without bleeding: Secondary | ICD-10-CM | POA: Diagnosis not present

## 2021-08-27 DIAGNOSIS — E1129 Type 2 diabetes mellitus with other diabetic kidney complication: Secondary | ICD-10-CM | POA: Diagnosis not present

## 2021-08-27 DIAGNOSIS — N183 Chronic kidney disease, stage 3 unspecified: Secondary | ICD-10-CM | POA: Diagnosis not present

## 2021-08-31 DIAGNOSIS — E1169 Type 2 diabetes mellitus with other specified complication: Secondary | ICD-10-CM | POA: Diagnosis not present

## 2021-08-31 DIAGNOSIS — Z0000 Encounter for general adult medical examination without abnormal findings: Secondary | ICD-10-CM | POA: Diagnosis not present

## 2021-08-31 DIAGNOSIS — Z1331 Encounter for screening for depression: Secondary | ICD-10-CM | POA: Diagnosis not present

## 2021-08-31 DIAGNOSIS — E782 Mixed hyperlipidemia: Secondary | ICD-10-CM | POA: Diagnosis not present

## 2021-08-31 DIAGNOSIS — E669 Obesity, unspecified: Secondary | ICD-10-CM | POA: Diagnosis not present

## 2021-09-01 DIAGNOSIS — N401 Enlarged prostate with lower urinary tract symptoms: Secondary | ICD-10-CM | POA: Diagnosis not present

## 2021-09-01 DIAGNOSIS — N200 Calculus of kidney: Secondary | ICD-10-CM | POA: Diagnosis not present

## 2021-09-01 DIAGNOSIS — R3121 Asymptomatic microscopic hematuria: Secondary | ICD-10-CM | POA: Diagnosis not present

## 2021-09-01 DIAGNOSIS — N3941 Urge incontinence: Secondary | ICD-10-CM | POA: Diagnosis not present

## 2021-10-05 DIAGNOSIS — D485 Neoplasm of uncertain behavior of skin: Secondary | ICD-10-CM | POA: Diagnosis not present

## 2021-10-05 DIAGNOSIS — D044 Carcinoma in situ of skin of scalp and neck: Secondary | ICD-10-CM | POA: Diagnosis not present

## 2021-10-05 DIAGNOSIS — L821 Other seborrheic keratosis: Secondary | ICD-10-CM | POA: Diagnosis not present

## 2021-10-05 DIAGNOSIS — C44519 Basal cell carcinoma of skin of other part of trunk: Secondary | ICD-10-CM | POA: Diagnosis not present

## 2021-10-05 DIAGNOSIS — Z85828 Personal history of other malignant neoplasm of skin: Secondary | ICD-10-CM | POA: Diagnosis not present

## 2021-10-05 DIAGNOSIS — D692 Other nonthrombocytopenic purpura: Secondary | ICD-10-CM | POA: Diagnosis not present

## 2021-10-05 DIAGNOSIS — L565 Disseminated superficial actinic porokeratosis (DSAP): Secondary | ICD-10-CM | POA: Diagnosis not present

## 2021-10-05 DIAGNOSIS — C44619 Basal cell carcinoma of skin of left upper limb, including shoulder: Secondary | ICD-10-CM | POA: Diagnosis not present

## 2021-10-13 ENCOUNTER — Encounter: Payer: Self-pay | Admitting: Cardiology

## 2021-11-24 DIAGNOSIS — H524 Presbyopia: Secondary | ICD-10-CM | POA: Diagnosis not present

## 2021-11-24 DIAGNOSIS — E119 Type 2 diabetes mellitus without complications: Secondary | ICD-10-CM | POA: Diagnosis not present

## 2021-12-15 ENCOUNTER — Other Ambulatory Visit: Payer: Self-pay

## 2021-12-16 ENCOUNTER — Encounter: Payer: Self-pay | Admitting: Cardiology

## 2021-12-16 ENCOUNTER — Ambulatory Visit (INDEPENDENT_AMBULATORY_CARE_PROVIDER_SITE_OTHER): Payer: Medicare Other | Admitting: Cardiology

## 2021-12-16 VITALS — BP 128/56 | HR 72 | Ht 69.6 in | Wt 190.2 lb

## 2021-12-16 DIAGNOSIS — E088 Diabetes mellitus due to underlying condition with unspecified complications: Secondary | ICD-10-CM

## 2021-12-16 DIAGNOSIS — I10 Essential (primary) hypertension: Secondary | ICD-10-CM | POA: Diagnosis not present

## 2021-12-16 DIAGNOSIS — I25709 Atherosclerosis of coronary artery bypass graft(s), unspecified, with unspecified angina pectoris: Secondary | ICD-10-CM

## 2021-12-16 DIAGNOSIS — E782 Mixed hyperlipidemia: Secondary | ICD-10-CM | POA: Diagnosis not present

## 2021-12-16 DIAGNOSIS — N289 Disorder of kidney and ureter, unspecified: Secondary | ICD-10-CM

## 2021-12-16 NOTE — Progress Notes (Signed)
Cardiology Office Note:    Date:  12/16/2021   ID:  Glenn Roach, DOB 01-05-1946, MRN [id number]  PCP:  Mateo Flow, MD  Cardiologist:  Jenean Lindau, MD   Referring MD: Mateo Flow, MD    ASSESSMENT:    1. Coronary artery disease involving coronary bypass graft of native heart with angina pectoris ([place])   2. Essential hypertension   3. Diabetes mellitus due to underlying condition with unspecified complications ([place])   4. Mild renal insufficiency   5. Mixed dyslipidemia    PLAN:    In order of problems listed above:  Coronary artery disease: Secondary prevention stressed with patient.  Importance of compliance with diet medication stressed and he vocalized understanding.  He was advised to walk at least half an hour a day 5 days a week and he promises to do so. Essential hypertension: Blood pressure stable and diet was emphasized. Mixed dyslipidemia: On statin therapy and lipids were reviewed and discussed with him Diabetes mellitus and abdominal obesity: Weight reduction stressed and diet emphasized. Renal insufficiency: Last creatinine was 1.8 and he is aware of this.  This is monitored by primary care. Patient will be seen in follow-up appointment in 6 months or earlier if the patient has any concerns    Medication Adjustments/Labs and Tests Ordered: Current medicines are reviewed at length with the patient today.  Concerns regarding medicines are outlined above.  No orders of the defined types were placed in this encounter.  No orders of the defined types were placed in this encounter.    No chief complaint on file.    History of Present Illness:    Glenn Roach is a 76 y.o. male.  Patient has past medical history of coronary artery disease, essential hypertension, dyslipidemia and diabetes mellitus.  He denies any problems at this time and takes care of activities of daily living.  He has history of renal insufficiency.  Overall he leads a sedentary  lifestyle.  Tries to exercise.  At the time of my evaluation, the patient is alert awake oriented and in no distress.  Past Medical History:  Diagnosis Date   CAD (coronary artery disease)    Chest tightness    Coronary artery disease involving coronary bypass graft of native heart with angina pectoris ([place]) 10/28/2008   Qualifier: Diagnosis of  By: [name]     Diabetes mellitus due to underlying condition with unspecified complications ([place]) 10/17/8117   Qualifier: Diagnosis of  By: [name]     Dyslipidemia    Essential hypertension 04/14/2015   Mild renal insufficiency 04/14/2015   Mixed dyslipidemia 01/19/2017   NEPHROLITHIASIS, HX OF 10/28/2008   Qualifier: Diagnosis of  By: [name]     Non morbid obesity due to excess calories 04/14/2015    Past Surgical History:  Procedure Laterality Date   NO PAST SURGERIES      Current Medications: Current Meds  Medication Sig   clopidogrel (PLAVIX) 75 MG tablet Take 1 tablet (75 mg total) by mouth daily.   Coenzyme Q-10 200 MG CAPS Take 200 mg by mouth daily.    ezetimibe (ZETIA) 10 MG tablet Take 1 tablet (10 mg total) by mouth daily.   fenofibrate 160 MG tablet Take 1 tablet (160 mg total) by mouth daily.   glimepiride (AMARYL) 2 MG tablet Take 2 mg by mouth daily with breakfast.    isosorbide mononitrate (IMDUR) 30 MG 24 hr tablet Take 1 tablet (30 mg  total) by mouth daily.   Melatonin 10 MG TBCR Take 10 mg by mouth as needed (sleep).   metoprolol tartrate (LOPRESSOR) 25 MG tablet Take 0.5 tablets (12.5 mg total) by mouth 2 (two) times daily.   Multiple Vitamins-Minerals (CENTRUM SILVER PO) Take 1 capsule by mouth daily.   nitroGLYCERIN (NITROSTAT) 0.4 MG SL tablet Place 1 tablet (0.4 mg total) under the tongue every 5 (five) minutes as needed for chest pain.   Omega-3 Fatty Acids (FISH OIL TRIPLE STRENGTH) 1400 MG CAPS Take 1,400 mg by mouth daily.   pantoprazole (PROTONIX) 40 MG tablet Take 40 mg by mouth daily.     Pitavastatin Calcium (LIVALO) 4 MG TABS Take 1 tablet (4 mg total) by mouth daily.   ranolazine (RANEXA) 500 MG 12 hr tablet Take 1 tablet (500 mg total) by mouth 2 (two) times daily.   tamsulosin (FLOMAX) 0.4 MG CAPS capsule Take 0.4 mg by mouth daily.   TRULICITY 1.5 BW/3.8LH SOPN Inject 1.5 mLs into the skin once a week.   zolpidem (AMBIEN) 5 MG tablet Take 5 mg by mouth at bedtime as needed for sleep.     Allergies:   Morphine, Ramipril, Rosuvastatin, and Ticlopidine hcl   Social History   Socioeconomic History   Marital status: Married    Spouse name: Not on file   Number of children: Not on file   Years of education: Not on file   Highest education level: Not on file  Occupational History   Not on file  Tobacco Use   Smoking status: Former   Smokeless tobacco: Never  Vaping Use   Vaping Use: Never used  Substance and Sexual Activity   Alcohol use: No   Drug use: No   Sexual activity: Not on file  Other Topics Concern   Not on file  Social History Narrative   Not on file   Social Determinants of Health   Financial Resource Strain: Not on file  Food Insecurity: Not on file  Transportation Needs: Not on file  Physical Activity: Not on file  Stress: Not on file  Social Connections: Not on file     Family History: The patient's family history includes Heart attack in his father; Heart disease in his mother; Heart failure in his mother; Stroke in his father.  ROS:   Please see the history of present illness.    All other systems reviewed and are negative.  EKGs/Labs/Other Studies Reviewed:    The following studies were reviewed today: I discussed my findings with the patient at length including lab work   Recent Labs: 06/15/2021: ALT 16; BUN 29; Creatinine, Ser 2.00; Hemoglobin 14.8; Platelets 175; Potassium 4.8; Sodium 143; TSH 1.840  Recent Lipid Panel    Component Value Date/Time   CHOL 146 06/15/2021 1044   TRIG 108 06/15/2021 1044   HDL 51 06/15/2021  1044   CHOLHDL 2.9 06/15/2021 1044   CHOLHDL 5.3 09/08/2010 0415   VLDL UNABLE TO CALCULATE IF TRIGLYCERIDE OVER 400 mg/dL 09/08/2010 0415   LDLCALC 75 06/15/2021 1044    Physical Exam:    VS:  BP (!) 128/56   Pulse 72   Ht 5' 9.6" (1.768 m)   Wt 190 lb 3.2 oz (86.3 kg)   SpO2 97%   BMI 27.61 kg/m     Wt Readings from Last 3 Encounters:  12/16/21 190 lb 3.2 oz (86.3 kg)  06/17/21 192 lb 12.8 oz (87.5 kg)  12/22/20 191 lb 9.6 oz (86.9 kg)  GEN: Patient is in no acute distress HEENT: Normal NECK: No JVD; No carotid bruits LYMPHATICS: No lymphadenopathy CARDIAC: Hear sounds regular, 2/6 systolic murmur at the apex. RESPIRATORY:  Clear to auscultation without rales, wheezing or rhonchi  ABDOMEN: Soft, non-tender, non-distended MUSCULOSKELETAL:  No edema; No deformity  SKIN: Warm and dry NEUROLOGIC:  Alert and oriented x 3 PSYCHIATRIC:  Normal affect   Signed, Jenean Lindau, MD  12/16/2021 1:43 PM    [facility]

## 2021-12-16 NOTE — Patient Instructions (Addendum)

## 2022-01-28 DIAGNOSIS — M84372A Stress fracture, left ankle, initial encounter for fracture: Secondary | ICD-10-CM | POA: Diagnosis not present

## 2022-02-01 ENCOUNTER — Other Ambulatory Visit: Payer: Self-pay | Admitting: Cardiology

## 2022-02-02 ENCOUNTER — Encounter (HOSPITAL_COMMUNITY): Payer: Self-pay | Admitting: Orthopedic Surgery

## 2022-02-02 ENCOUNTER — Other Ambulatory Visit: Payer: Self-pay

## 2022-02-02 DIAGNOSIS — M25572 Pain in left ankle and joints of left foot: Secondary | ICD-10-CM | POA: Diagnosis not present

## 2022-02-02 DIAGNOSIS — S82852A Displaced trimalleolar fracture of left lower leg, initial encounter for closed fracture: Secondary | ICD-10-CM | POA: Diagnosis not present

## 2022-02-02 NOTE — Progress Notes (Signed)
PCP - Dr. Sarina Ser  Cardiologist - Dr. Geraldo Pitter  EP- Denies  Endocrine- Denies  Pulm- Denies  Chest x-ray - Denies  EKG - 06/17/21 (E)  Stress Test - 12/30/16 (E)  ECHO - 12/30/16 (E)  Cardiac Cath - Denies  AICD-na PM-na LOOP-na  Nerve Stimulator- Denies  Dialysis- Denies  Sleep Study - Yes- Positive CPAP - Yes  LABS- 02/03/22: CBC, BMP, PCR  ASA- Denies PLAVIX- LD- 7/26  ERAS- Yes- clears until 1130  HA1C- 08/27/21(E): 7.0- Pt states he rarely checks his levels. Instructions given to check levels Q2 hrs until he arrives for his surgery.  Anesthesia- No  Pt denies having chest pain, sob, or fever during the pre-op phone call. All instructions explained to the pt, with a verbal understanding of the material including: as of today, stop taking all Aspirin (unless instructed by your doctor) and Other Aspirin containing products, Vitamins, Fish oils, and Herbal medications. Also stop all NSAIDS i.e. Advil, Ibuprofen, Motrin, Aleve, Anaprox, Naproxen, BC, Goody Powders, and all Supplements.  WHAT DO I DO ABOUT MY DIABETES MEDICATION?  Do not take Glimepiride the morning of surgery.  The day of surgery, do not take other diabetes injectable Trulicity (dulaglutide).  If your CBG is greater than 220 mg/dL, call the number below for further orders.  How do I manage my blood sugar before surgery? Check your blood sugar the morning of your surgery when you wake up and every 2 hours until you get to the Short Stay unit. If your blood sugar is less than 70 mg/dL, you will need to treat for low blood sugar: Do not take insulin. Treat a low blood sugar (less than 70 mg/dL) with  cup of clear juice (cranberry or apple), 4 glucose tablets, OR glucose gel. Recheck blood sugar in 15 minutes after treatment (to make sure it is greater than 70 mg/dL). If your blood sugar is not greater than 70 mg/dL on recheck, call [phone]  for further instructions. Report your blood sugar to  the short stay nurse when you get to Short Stay.  Reviewed and Endorsed by [facility] Patient Education Committee, August 2015   Pt also instructed to wear a mask and social distance if he goes out. The opportunity to ask questions was provided.

## 2022-02-03 ENCOUNTER — Ambulatory Visit (HOSPITAL_COMMUNITY): Payer: Medicare Other

## 2022-02-03 ENCOUNTER — Encounter (HOSPITAL_COMMUNITY): Payer: Self-pay | Admitting: Orthopedic Surgery

## 2022-02-03 ENCOUNTER — Ambulatory Visit (HOSPITAL_BASED_OUTPATIENT_CLINIC_OR_DEPARTMENT_OTHER): Payer: Medicare Other | Admitting: Certified Registered Nurse Anesthetist

## 2022-02-03 ENCOUNTER — Ambulatory Visit (HOSPITAL_COMMUNITY): Payer: Medicare Other | Admitting: Certified Registered Nurse Anesthetist

## 2022-02-03 ENCOUNTER — Observation Stay (HOSPITAL_COMMUNITY)
Admission: RE | Admit: 2022-02-03 | Discharge: 2022-02-06 | Disposition: A | Discharge status: Skilled Nursing Facility | Payer: Medicare Other | Attending: Orthopedic Surgery | Admitting: Orthopedic Surgery

## 2022-02-03 ENCOUNTER — Encounter (HOSPITAL_COMMUNITY)
Admission: RE | Disposition: A | Discharge status: Skilled Nursing Facility | Payer: Self-pay | Source: Home / Self Care | Attending: Orthopedic Surgery

## 2022-02-03 ENCOUNTER — Other Ambulatory Visit: Payer: Self-pay

## 2022-02-03 DIAGNOSIS — Z7902 Long term (current) use of antithrombotics/antiplatelets: Secondary | ICD-10-CM | POA: Insufficient documentation

## 2022-02-03 DIAGNOSIS — E088 Diabetes mellitus due to underlying condition with unspecified complications: Secondary | ICD-10-CM | POA: Diagnosis present

## 2022-02-03 DIAGNOSIS — Y92009 Unspecified place in unspecified non-institutional (private) residence as the place of occurrence of the external cause: Secondary | ICD-10-CM | POA: Diagnosis not present

## 2022-02-03 DIAGNOSIS — Z87891 Personal history of nicotine dependence: Secondary | ICD-10-CM

## 2022-02-03 DIAGNOSIS — N1832 Chronic kidney disease, stage 3b: Secondary | ICD-10-CM

## 2022-02-03 DIAGNOSIS — I25708 Atherosclerosis of coronary artery bypass graft(s), unspecified, with other forms of angina pectoris: Secondary | ICD-10-CM | POA: Diagnosis present

## 2022-02-03 DIAGNOSIS — I25119 Atherosclerotic heart disease of native coronary artery with unspecified angina pectoris: Secondary | ICD-10-CM | POA: Diagnosis not present

## 2022-02-03 DIAGNOSIS — I129 Hypertensive chronic kidney disease with stage 1 through stage 4 chronic kidney disease, or unspecified chronic kidney disease: Secondary | ICD-10-CM | POA: Diagnosis not present

## 2022-02-03 DIAGNOSIS — R2681 Unsteadiness on feet: Secondary | ICD-10-CM | POA: Diagnosis not present

## 2022-02-03 DIAGNOSIS — Z951 Presence of aortocoronary bypass graft: Secondary | ICD-10-CM | POA: Insufficient documentation

## 2022-02-03 DIAGNOSIS — Z7984 Long term (current) use of oral hypoglycemic drugs: Secondary | ICD-10-CM | POA: Diagnosis not present

## 2022-02-03 DIAGNOSIS — I10 Essential (primary) hypertension: Secondary | ICD-10-CM | POA: Diagnosis present

## 2022-02-03 DIAGNOSIS — E1122 Type 2 diabetes mellitus with diabetic chronic kidney disease: Secondary | ICD-10-CM | POA: Diagnosis not present

## 2022-02-03 DIAGNOSIS — I2510 Atherosclerotic heart disease of native coronary artery without angina pectoris: Secondary | ICD-10-CM | POA: Insufficient documentation

## 2022-02-03 DIAGNOSIS — I25709 Atherosclerosis of coronary artery bypass graft(s), unspecified, with unspecified angina pectoris: Secondary | ICD-10-CM | POA: Diagnosis present

## 2022-02-03 DIAGNOSIS — W109XXA Fall (on) (from) unspecified stairs and steps, initial encounter: Secondary | ICD-10-CM | POA: Diagnosis not present

## 2022-02-03 DIAGNOSIS — E782 Mixed hyperlipidemia: Secondary | ICD-10-CM | POA: Diagnosis present

## 2022-02-03 DIAGNOSIS — S82852A Displaced trimalleolar fracture of left lower leg, initial encounter for closed fracture: Secondary | ICD-10-CM

## 2022-02-03 DIAGNOSIS — N189 Chronic kidney disease, unspecified: Secondary | ICD-10-CM | POA: Diagnosis not present

## 2022-02-03 DIAGNOSIS — Z79899 Other long term (current) drug therapy: Secondary | ICD-10-CM | POA: Insufficient documentation

## 2022-02-03 DIAGNOSIS — N183 Chronic kidney disease, stage 3 unspecified: Secondary | ICD-10-CM

## 2022-02-03 HISTORY — DX: Gastro-esophageal reflux disease without esophagitis: K21.9

## 2022-02-03 HISTORY — DX: Personal history of urinary calculi: Z87.442

## 2022-02-03 HISTORY — DX: Displaced trimalleolar fracture of left lower leg, initial encounter for closed fracture: S82.852A

## 2022-02-03 HISTORY — DX: Sleep apnea, unspecified: G47.30

## 2022-02-03 HISTORY — DX: Chronic kidney disease, stage 3 unspecified: N18.30

## 2022-02-03 HISTORY — PX: ORIF ANKLE FRACTURE: SHX5408

## 2022-02-03 LAB — CBC
HCT: 45.4 % (ref 39.0–52.0)
Hemoglobin: 14.4 g/dL (ref 13.0–17.0)
MCH: 30.2 pg (ref 26.0–34.0)
MCHC: 31.7 g/dL (ref 30.0–36.0)
MCV: 95.2 fL (ref 80.0–100.0)
Platelets: 198 10*3/uL (ref 150–400)
RBC: 4.77 MIL/uL (ref 4.22–5.81)
RDW: 13.3 % (ref 11.5–15.5)
WBC: 6.6 10*3/uL (ref 4.0–10.5)
nRBC: 0 % (ref 0.0–0.2)

## 2022-02-03 LAB — BASIC METABOLIC PANEL
Anion gap: 12 (ref 5–15)
BUN: 32 mg/dL — ABNORMAL HIGH (ref 8–23)
CO2: 17 mmol/L — ABNORMAL LOW (ref 22–32)
Calcium: 9.3 mg/dL (ref 8.9–10.3)
Chloride: 110 mmol/L (ref 98–111)
Creatinine, Ser: 1.9 mg/dL — ABNORMAL HIGH (ref 0.61–1.24)
GFR, Estimated: 36 mL/min — ABNORMAL LOW (ref >60)
Glucose, Bld: 154 mg/dL — ABNORMAL HIGH (ref 70–99)
Potassium: 5.1 mmol/L (ref 3.5–5.1)
Sodium: 139 mmol/L (ref 135–145)

## 2022-02-03 LAB — HEMOGLOBIN A1C
Hgb A1c MFr Bld: 7 % — ABNORMAL HIGH (ref 4.8–5.6)
Mean Plasma Glucose: 154.2 mg/dL

## 2022-02-03 LAB — GLUCOSE, CAPILLARY
Glucose-Capillary: 164 mg/dL — ABNORMAL HIGH (ref 70–99)
Glucose-Capillary: 108 mg/dL — ABNORMAL HIGH (ref 70–99)
Glucose-Capillary: 123 mg/dL — ABNORMAL HIGH (ref 70–99)
Glucose-Capillary: 151 mg/dL — ABNORMAL HIGH (ref 70–99)
Glucose-Capillary: 273 mg/dL — ABNORMAL HIGH (ref 70–99)

## 2022-02-03 LAB — SURGICAL PCR SCREEN
MRSA, PCR: NEGATIVE
Staphylococcus aureus: NEGATIVE

## 2022-02-03 SURGERY — OPEN REDUCTION INTERNAL FIXATION (ORIF) ANKLE FRACTURE
Anesthesia: General | Site: Ankle | Laterality: Left

## 2022-02-03 MED ORDER — FENTANYL CITRATE (PF) 250 MCG/5ML IJ SOLN
INTRAMUSCULAR | Status: AC
  Filled 2022-02-03: qty 5

## 2022-02-03 MED ORDER — LIDOCAINE 2% (20 MG/ML) 5 ML SYRINGE
INTRAMUSCULAR | Status: DC | PRN
  Administered 2022-02-03: 70 mg via INTRAVENOUS

## 2022-02-03 MED ORDER — CHLORHEXIDINE GLUCONATE 0.12 % MT SOLN: 15.0000 mL | Freq: Once | OROMUCOSAL | Status: AC

## 2022-02-03 MED ORDER — DOCUSATE SODIUM 100 MG PO CAPS
100.0000 mg | ORAL_CAPSULE | Freq: Two times a day (BID) | ORAL | Status: DC
  Administered 2022-02-03 – 2022-02-06 (×6): 100 mg via ORAL
  Filled 2022-02-03 (×5): qty 1

## 2022-02-03 MED ORDER — VANCOMYCIN HCL 1000 MG IV SOLR
INTRAVENOUS | Status: DC | PRN
  Administered 2022-02-03: 1000 mg

## 2022-02-03 MED ORDER — INSULIN ASPART 100 UNIT/ML IJ SOLN
0.0000 [IU] | Freq: Three times a day (TID) | INTRAMUSCULAR | Status: DC
  Administered 2022-02-03: 3 [IU] via SUBCUTANEOUS
  Administered 2022-02-04: 5 [IU] via SUBCUTANEOUS
  Administered 2022-02-04: 2 [IU] via SUBCUTANEOUS
  Administered 2022-02-04 – 2022-02-05 (×3): 3 [IU] via SUBCUTANEOUS
  Administered 2022-02-05: 0 [IU] via SUBCUTANEOUS
  Administered 2022-02-06: 5 [IU] via SUBCUTANEOUS

## 2022-02-03 MED ORDER — ACETAMINOPHEN 325 MG PO TABS: 325.0000 mg | ORAL_TABLET | Freq: Four times a day (QID) | ORAL | Status: DC | PRN

## 2022-02-03 MED ORDER — HYDROMORPHONE HCL 1 MG/ML IJ SOLN
0.2500 mg | INTRAMUSCULAR | Status: DC | PRN
  Administered 2022-02-03 (×2): 0.5 mg via INTRAVENOUS

## 2022-02-03 MED ORDER — DEXAMETHASONE SODIUM PHOSPHATE 10 MG/ML IJ SOLN
INTRAMUSCULAR | Status: DC | PRN
  Administered 2022-02-03: 5 mg via INTRAVENOUS

## 2022-02-03 MED ORDER — ISOSORBIDE MONONITRATE ER 30 MG PO TB24
30.0000 mg | ORAL_TABLET | Freq: Every day | ORAL | Status: DC
  Administered 2022-02-04 – 2022-02-06 (×3): 30 mg via ORAL
  Filled 2022-02-03 (×3): qty 1

## 2022-02-03 MED ORDER — ONDANSETRON HCL 4 MG/2ML IJ SOLN: 4.0000 mg | Freq: Four times a day (QID) | INTRAMUSCULAR | Status: DC | PRN

## 2022-02-03 MED ORDER — POLYETHYLENE GLYCOL 3350 17 G PO PACK: 17.0000 g | PACK | Freq: Every day | ORAL | Status: DC | PRN

## 2022-02-03 MED ORDER — EPHEDRINE SULFATE-NACL 50-0.9 MG/10ML-% IV SOSY
PREFILLED_SYRINGE | INTRAVENOUS | Status: DC | PRN
  Administered 2022-02-03: 10 mg via INTRAVENOUS
  Administered 2022-02-03: 5 mg via INTRAVENOUS
  Administered 2022-02-03: 10 mg via INTRAVENOUS

## 2022-02-03 MED ORDER — METOCLOPRAMIDE HCL 5 MG PO TABS: 5.0000 mg | ORAL_TABLET | Freq: Three times a day (TID) | ORAL | Status: DC | PRN

## 2022-02-03 MED ORDER — ORAL CARE MOUTH RINSE: 15.0000 mL | Freq: Once | OROMUCOSAL | Status: AC

## 2022-02-03 MED ORDER — FENOFIBRATE 160 MG PO TABS
160.0000 mg | ORAL_TABLET | Freq: Every day | ORAL | Status: DC
  Administered 2022-02-04 – 2022-02-06 (×3): 160 mg via ORAL
  Filled 2022-02-03 (×3): qty 1

## 2022-02-03 MED ORDER — METOPROLOL TARTRATE 12.5 MG HALF TABLET
12.5000 mg | ORAL_TABLET | Freq: Two times a day (BID) | ORAL | Status: DC
  Administered 2022-02-03 – 2022-02-06 (×6): 12.5 mg via ORAL
  Filled 2022-02-03 (×6): qty 1

## 2022-02-03 MED ORDER — HYDROMORPHONE HCL 1 MG/ML IJ SOLN
INTRAMUSCULAR | Status: AC
  Filled 2022-02-03: qty 1

## 2022-02-03 MED ORDER — METOCLOPRAMIDE HCL 5 MG/ML IJ SOLN: 5.0000 mg | Freq: Three times a day (TID) | INTRAMUSCULAR | Status: DC | PRN

## 2022-02-03 MED ORDER — PRAVASTATIN SODIUM 40 MG PO TABS
40.0000 mg | ORAL_TABLET | Freq: Every day | ORAL | Status: DC
  Administered 2022-02-03 – 2022-02-05 (×3): 40 mg via ORAL
  Filled 2022-02-03 (×4): qty 1

## 2022-02-03 MED ORDER — BISACODYL 10 MG RE SUPP: 10.0000 mg | Freq: Every day | RECTAL | Status: DC | PRN

## 2022-02-03 MED ORDER — TAMSULOSIN HCL 0.4 MG PO CAPS
0.4000 mg | ORAL_CAPSULE | Freq: Every day | ORAL | Status: DC
  Administered 2022-02-04 – 2022-02-06 (×3): 0.4 mg via ORAL
  Filled 2022-02-03 (×3): qty 1

## 2022-02-03 MED ORDER — MORPHINE SULFATE (PF) 2 MG/ML IV SOLN: 0.5000 mg | INTRAVENOUS | Status: DC | PRN

## 2022-02-03 MED ORDER — FENTANYL CITRATE (PF) 250 MCG/5ML IJ SOLN
INTRAMUSCULAR | Status: DC | PRN
  Administered 2022-02-03: 25 ug via INTRAVENOUS

## 2022-02-03 MED ORDER — ALBUMIN HUMAN 5 % IV SOLN: INTRAVENOUS | Status: DC | PRN

## 2022-02-03 MED ORDER — HYDROCODONE-ACETAMINOPHEN 5-325 MG PO TABS
1.0000 | ORAL_TABLET | ORAL | Status: DC | PRN
  Administered 2022-02-04 – 2022-02-05 (×5): 2 via ORAL
  Filled 2022-02-03 (×5): qty 2

## 2022-02-03 MED ORDER — KCL IN DEXTROSE-NACL 20-5-0.45 MEQ/L-%-% IV SOLN
INTRAVENOUS | Status: DC
  Filled 2022-02-03 (×4): qty 1000

## 2022-02-03 MED ORDER — BUPIVACAINE LIPOSOME 1.3 % IJ SUSP
INTRAMUSCULAR | Status: DC | PRN
  Administered 2022-02-03: 20 mL

## 2022-02-03 MED ORDER — EZETIMIBE 10 MG PO TABS
10.0000 mg | ORAL_TABLET | Freq: Every day | ORAL | Status: DC
  Administered 2022-02-04 – 2022-02-06 (×3): 10 mg via ORAL
  Filled 2022-02-03 (×3): qty 1

## 2022-02-03 MED ORDER — INSULIN ASPART 100 UNIT/ML IJ SOLN: 0.0000 [IU] | INTRAMUSCULAR | Status: DC | PRN

## 2022-02-03 MED ORDER — PROPOFOL 10 MG/ML IV BOLUS
INTRAVENOUS | Status: AC
  Filled 2022-02-03: qty 20

## 2022-02-03 MED ORDER — BUPIVACAINE LIPOSOME 1.3 % IJ SUSP
INTRAMUSCULAR | Status: AC
  Filled 2022-02-03: qty 20

## 2022-02-03 MED ORDER — ONDANSETRON HCL 4 MG PO TABS: 4.0000 mg | ORAL_TABLET | Freq: Four times a day (QID) | ORAL | Status: DC | PRN

## 2022-02-03 MED ORDER — HYDRALAZINE HCL 20 MG/ML IJ SOLN: 10.0000 mg | Freq: Four times a day (QID) | INTRAMUSCULAR | Status: DC | PRN

## 2022-02-03 MED ORDER — PROPOFOL 10 MG/ML IV BOLUS
INTRAVENOUS | Status: DC | PRN
  Administered 2022-02-03: 50 mg via INTRAVENOUS
  Administered 2022-02-03: 100 mg via INTRAVENOUS
  Administered 2022-02-03: 50 mg via INTRAVENOUS

## 2022-02-03 MED ORDER — 0.9 % SODIUM CHLORIDE (POUR BTL) OPTIME
TOPICAL | Status: DC | PRN
  Administered 2022-02-03: 1000 mL

## 2022-02-03 MED ORDER — LACTATED RINGERS IV SOLN
INTRAVENOUS | Status: DC
Start: 2022-02-03 — End: 2022-02-03

## 2022-02-03 MED ORDER — RANOLAZINE ER 500 MG PO TB12
500.0000 mg | ORAL_TABLET | Freq: Two times a day (BID) | ORAL | Status: DC
  Administered 2022-02-03 – 2022-02-06 (×6): 500 mg via ORAL
  Filled 2022-02-03 (×7): qty 1

## 2022-02-03 MED ORDER — HYDROCODONE-ACETAMINOPHEN 7.5-325 MG PO TABS
ORAL_TABLET | ORAL | Status: AC
  Filled 2022-02-03: qty 1

## 2022-02-03 MED ORDER — CLOPIDOGREL BISULFATE 75 MG PO TABS
75.0000 mg | ORAL_TABLET | Freq: Every day | ORAL | Status: DC
  Administered 2022-02-03 – 2022-02-06 (×4): 75 mg via ORAL
  Filled 2022-02-03 (×4): qty 1

## 2022-02-03 MED ORDER — PANTOPRAZOLE SODIUM 40 MG PO TBEC
40.0000 mg | DELAYED_RELEASE_TABLET | Freq: Every day | ORAL | Status: DC
  Administered 2022-02-04 – 2022-02-06 (×3): 40 mg via ORAL
  Filled 2022-02-03 (×3): qty 1

## 2022-02-03 MED ORDER — PHENYLEPHRINE HCL-NACL 20-0.9 MG/250ML-% IV SOLN
INTRAVENOUS | Status: DC | PRN
  Administered 2022-02-03: 20 ug/min via INTRAVENOUS

## 2022-02-03 MED ORDER — FLEET ENEMA 7-19 GM/118ML RE ENEM: 1.0000 | ENEMA | Freq: Once | RECTAL | Status: DC | PRN

## 2022-02-03 MED ORDER — ONDANSETRON HCL 4 MG/2ML IJ SOLN
INTRAMUSCULAR | Status: DC | PRN
  Administered 2022-02-03: 4 mg via INTRAVENOUS

## 2022-02-03 MED ORDER — ADULT MULTIVITAMIN W/MINERALS CH
1.0000 | ORAL_TABLET | Freq: Every day | ORAL | Status: DC
  Administered 2022-02-04 – 2022-02-06 (×3): 1 via ORAL
  Filled 2022-02-03 (×3): qty 1

## 2022-02-03 MED ORDER — PHENYLEPHRINE 80 MCG/ML (10ML) SYRINGE FOR IV PUSH (FOR BLOOD PRESSURE SUPPORT)
PREFILLED_SYRINGE | INTRAVENOUS | Status: DC | PRN
  Administered 2022-02-03 (×2): 80 ug via INTRAVENOUS
  Administered 2022-02-03: 240 ug via INTRAVENOUS
  Administered 2022-02-03: 80 ug via INTRAVENOUS

## 2022-02-03 MED ORDER — CHLORHEXIDINE GLUCONATE 0.12 % MT SOLN
OROMUCOSAL | Status: AC
  Administered 2022-02-03: 15 mL via OROMUCOSAL
  Filled 2022-02-03: qty 15

## 2022-02-03 MED ORDER — VANCOMYCIN HCL 1000 MG IV SOLR
INTRAVENOUS | Status: AC
  Filled 2022-02-03: qty 20

## 2022-02-03 MED ORDER — CEFAZOLIN SODIUM-DEXTROSE 2-3 GM-%(50ML) IV SOLR
INTRAVENOUS | Status: DC | PRN
  Administered 2022-02-03: 2 g via INTRAVENOUS

## 2022-02-03 MED ORDER — MELATONIN 5 MG PO TABS
10.0000 mg | ORAL_TABLET | Freq: Every evening | ORAL | Status: DC | PRN
Start: 2022-02-03 — End: 2022-02-06

## 2022-02-03 MED ORDER — HYDROCODONE-ACETAMINOPHEN 7.5-325 MG PO TABS
1.0000 | ORAL_TABLET | ORAL | Status: DC | PRN
  Administered 2022-02-03: 1 via ORAL
  Administered 2022-02-03 – 2022-02-04 (×2): 2 via ORAL
  Administered 2022-02-05 – 2022-02-06 (×2): 1 via ORAL
  Filled 2022-02-03 (×2): qty 1
  Filled 2022-02-03 (×2): qty 2

## 2022-02-03 SURGICAL SUPPLY — 74 items
ALCOHOL 70% 16 OZ (MISCELLANEOUS) ×2 IMPLANT
APL PRP STRL LF DISP 70% ISPRP (MISCELLANEOUS) ×2
BAG COUNTER SPONGE SURGICOUNT (BAG) ×2 IMPLANT
BAG SPNG CNTER NS LX DISP (BAG) ×1
BANDAGE ESMARK 6X9 LF (GAUZE/BANDAGES/DRESSINGS) ×1 IMPLANT
BIT DRILL 2.5X2.75 QC CALB (BIT) ×1 IMPLANT
BIT DRILL 2.9 CANN QC NONSTRL (BIT) ×1 IMPLANT
BIT DRILL 3.5X5.5 QC CALB (BIT) ×1 IMPLANT
BLADE SURG 15 STRL LF DISP TIS (BLADE) ×1 IMPLANT
BLADE SURG 15 STRL SS (BLADE) ×2
BNDG CMPR 9X6 STRL LF SNTH (GAUZE/BANDAGES/DRESSINGS) ×1
BNDG COHESIVE 4X5 TAN STRL (GAUZE/BANDAGES/DRESSINGS) ×2 IMPLANT
BNDG COHESIVE 6X5 TAN STRL LF (GAUZE/BANDAGES/DRESSINGS) ×2 IMPLANT
BNDG ELASTIC 4X5.8 VLCR STR LF (GAUZE/BANDAGES/DRESSINGS) ×1 IMPLANT
BNDG ELASTIC 6X5.8 VLCR STR LF (GAUZE/BANDAGES/DRESSINGS) ×1 IMPLANT
BNDG ESMARK 6X9 LF (GAUZE/BANDAGES/DRESSINGS) ×2
CANISTER SUCT 3000ML PPV (MISCELLANEOUS) ×2 IMPLANT
CHLORAPREP W/TINT 26 (MISCELLANEOUS) ×4 IMPLANT
COVER SURGICAL LIGHT HANDLE (MISCELLANEOUS) ×2 IMPLANT
CUFF TOURN SGL QUICK 34 (TOURNIQUET CUFF) ×2
CUFF TOURN SGL QUICK 42 (TOURNIQUET CUFF) IMPLANT
CUFF TRNQT CYL 34X4.125X (TOURNIQUET CUFF) ×1 IMPLANT
DRAPE OEC MINIVIEW 54X84 (DRAPES) ×2 IMPLANT
DRAPE U-SHAPE 47X51 STRL (DRAPES) ×2 IMPLANT
DRSG MEPITEL 4X7.2 (GAUZE/BANDAGES/DRESSINGS) ×2 IMPLANT
DRSG PAD ABDOMINAL 8X10 ST (GAUZE/BANDAGES/DRESSINGS) ×4 IMPLANT
ELECT REM PT RETURN 9FT ADLT (ELECTROSURGICAL) ×2
ELECTRODE REM PT RTRN 9FT ADLT (ELECTROSURGICAL) ×1 IMPLANT
GAUZE PAD ABD 8X10 STRL (GAUZE/BANDAGES/DRESSINGS) ×1 IMPLANT
GAUZE SPONGE 4X4 12PLY STRL (GAUZE/BANDAGES/DRESSINGS) ×1 IMPLANT
GLOVE BIO SURGEON STRL SZ8 (GLOVE) ×2 IMPLANT
GLOVE BIOGEL PI IND STRL 8 (GLOVE) ×1 IMPLANT
GLOVE BIOGEL PI INDICATOR 8 (GLOVE) ×1
GLOVE ECLIPSE 8.0 STRL XLNG CF (GLOVE) ×2 IMPLANT
GOWN STRL REUS W/ TWL LRG LVL3 (GOWN DISPOSABLE) ×1 IMPLANT
GOWN STRL REUS W/ TWL XL LVL3 (GOWN DISPOSABLE) ×2 IMPLANT
GOWN STRL REUS W/TWL LRG LVL3 (GOWN DISPOSABLE) ×2
GOWN STRL REUS W/TWL XL LVL3 (GOWN DISPOSABLE) ×4
K-WIRE ACE 1.6X6 (WIRE) ×4
KIT BASIN OR (CUSTOM PROCEDURE TRAY) ×2 IMPLANT
KIT TURNOVER KIT B (KITS) ×2 IMPLANT
KWIRE ACE 1.6X6 (WIRE) IMPLANT
NDL HYPO 21X1.5 SAFETY (NEEDLE) IMPLANT
NEEDLE HYPO 21X1.5 SAFETY (NEEDLE) ×2 IMPLANT
NS IRRIG 1000ML POUR BTL (IV SOLUTION) ×2 IMPLANT
PACK ORTHO EXTREMITY (CUSTOM PROCEDURE TRAY) ×2 IMPLANT
PAD ABD 8X10 STRL (GAUZE/BANDAGES/DRESSINGS) ×1 IMPLANT
PAD ARMBOARD 7.5X6 YLW CONV (MISCELLANEOUS) ×4 IMPLANT
PAD CAST 4YDX4 CTTN HI CHSV (CAST SUPPLIES) ×1 IMPLANT
PADDING CAST COTTON 4X4 STRL (CAST SUPPLIES) ×2
PADDING CAST SYN 6 (CAST SUPPLIES) ×1
PADDING CAST SYNTHETIC 4 (CAST SUPPLIES) ×1
PADDING CAST SYNTHETIC 4X4 STR (CAST SUPPLIES) IMPLANT
PADDING CAST SYNTHETIC 6X4 NS (CAST SUPPLIES) IMPLANT
PLATE ACE 100DEG 7HOLE (Plate) ×1 IMPLANT
PLATE TMT CLOVER LFT 6H (Plate) ×1 IMPLANT
SCREW ACE CAN 4.0 40M (Screw) ×2 IMPLANT
SCREW CORTICAL 3.5MM  16MM (Screw) ×4 IMPLANT
SCREW CORTICAL 3.5MM 16MM (Screw) IMPLANT
SCREW CORTICAL 3.5MM 18MM (Screw) ×3 IMPLANT
SCREW CORTICAL 3.5MM 22MM (Screw) ×2 IMPLANT
SPLINT PLASTER CAST XFAST 5X30 (CAST SUPPLIES) IMPLANT
SPLINT PLASTER XFAST SET 5X30 (CAST SUPPLIES) ×1
SPONGE T-LAP 18X18 ~~LOC~~+RFID (SPONGE) ×2 IMPLANT
SUCTION FRAZIER HANDLE 10FR (MISCELLANEOUS) ×2
SUCTION TUBE FRAZIER 10FR DISP (MISCELLANEOUS) ×1 IMPLANT
SUT ETHILON 3 0 PS 1 (SUTURE) ×2 IMPLANT
SUT MNCRL AB 3-0 PS2 18 (SUTURE) IMPLANT
SUT VIC AB 2-0 CT1 27 (SUTURE) ×4
SUT VIC AB 2-0 CT1 TAPERPNT 27 (SUTURE) ×2 IMPLANT
SYR 20ML LL LF (SYRINGE) ×1 IMPLANT
TOWEL GREEN STERILE (TOWEL DISPOSABLE) ×2 IMPLANT
TOWEL GREEN STERILE FF (TOWEL DISPOSABLE) ×2 IMPLANT
TUBE CONNECTING 12X1/4 (SUCTIONS) ×2 IMPLANT

## 2022-02-03 NOTE — Anesthesia Preprocedure Evaluation (Addendum)
Anesthesia Evaluation  Patient identified by MRN, date of birth, ID band Patient awake    Reviewed: Allergy & Precautions, NPO status , Patient's Chart, lab work & pertinent test results  Airway Mallampati: II  TM Distance: >3 FB     Dental   Pulmonary sleep apnea , former smoker,    breath sounds clear to auscultation       Cardiovascular hypertension, + angina + CAD   Rhythm:Regular Rate:Normal     Neuro/Psych    GI/Hepatic Neg liver ROS, GERD  ,  Endo/Other  diabetes  Renal/GU Renal disease     Musculoskeletal   Abdominal   Peds  Hematology   Anesthesia Other Findings   Reproductive/Obstetrics                             Anesthesia Physical Anesthesia Plan  ASA: 3  Anesthesia Plan: General   Post-op Pain Management:    Induction: Intravenous  PONV Risk Score and Plan: Ondansetron, Dexamethasone and Midazolam  Airway Management Planned: LMA  Additional Equipment:   Intra-op Plan:   Post-operative Plan: Extubation in OR  Informed Consent: I have reviewed the patients History and Physical, chart, labs and discussed the procedure including the risks, benefits and alternatives for the proposed anesthesia with the patient or authorized representative who has indicated his/her understanding and acceptance.     Dental advisory given  Plan Discussed with: CRNA, Anesthesiologist and Surgeon  Anesthesia Plan Comments:        Anesthesia Quick Evaluation

## 2022-02-03 NOTE — Anesthesia Postprocedure Evaluation (Signed)
Anesthesia Post Note  Patient: Glenn Roach  Procedure(s) Performed: OPEN REDUCTION INTERNAL FIXATION (ORIF) ANKLE FRACTURE TRIMALLEOLAR FRACTURE (Left: Ankle)     Patient location during evaluation: PACU Anesthesia Type: General Level of consciousness: awake Pain management: pain level controlled Vital Signs Assessment: post-procedure vital signs reviewed and stable Respiratory status: spontaneous breathing Cardiovascular status: stable Postop Assessment: no apparent nausea or vomiting Anesthetic complications: no   No notable events documented.  Last Vitals:  Vitals:   02/03/22 1700 02/03/22 1730  BP: 133/72 132/71  Pulse: 77 79  Resp: 15 13  Temp:    SpO2: 95% 94%    Last Pain:  Vitals:   02/03/22 1525  TempSrc:   PainSc: 0-No pain                 Terrius Gentile

## 2022-02-03 NOTE — Consult Note (Signed)
Initial Consultation Note   Patient: Glenn Roach YIR:[id number] DOB: 1945/07/30 PCP: Mateo Flow, MD DOA: 02/03/2022 DOS: the patient was seen and examined on 02/03/2022 Primary service: Wylene Simmer, MD  Referring physician: Dr. Wylene Simmer Reason for consult: Medical management  Assessment/Plan: Displaced trimalleolar fracture of left ankle: S/p ORIF-defer to primary service.  DM-2: Hold Amaryl/Trulicity-we will manage with SSI-follow and optimize.  HTN: Resume nitrates, metoprolol-follow and optimize.  HLD: Continue Zetia/fenofibrate/statin.  CAD s/p CABG 2012: No overt anginal symptoms-seems to have tolerated ORIF pretty well.  Not on aspirin at home.  Continue Plavix/statin/beta-blocker and Ranexa.  Follows with Dr. Rico Junker cardiology in [place].  CKD stage IIIb: Creatinine close to baseline-follow.  GERD: Continue PPI  BPH: Continue Flomax  TRH will continue to follow the patient.  HPI: Glenn Roach is a 76 y.o. 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  For post mechanical-his foot caught the step-he never syncopized.  Upon further evaluation he was unfortunately found to have a displaced trimalleolar fracture of his left ankle.  He was brought to the hospital with orthopedics for surgical repair.  The hospitalist service was asked to manage his medical comorbidities.  Patient denies any chest pain or shortness of breath at rest.  He does get some mild exertional dyspnea at times.  He has no nausea, vomiting or diarrhea.    Review of Systems: As mentioned in the history of present illness. All other systems reviewed and are negative. Past Medical History:  Diagnosis Date   CAD (coronary artery disease)    Chest tightness    Coronary artery disease involving coronary bypass  graft of native heart with angina pectoris ([place]) 10/28/2008   Qualifier: Diagnosis of  By: [name]     Diabetes mellitus due to underlying condition with unspecified complications ([place]) 50/03/3817   Qualifier: Diagnosis of  By: [name]     Dyslipidemia    Essential hypertension 04/14/2015   GERD (gastroesophageal reflux disease)    History of kidney stones    Mild renal insufficiency 04/14/2015   Mixed dyslipidemia 01/19/2017   NEPHROLITHIASIS, HX OF 10/28/2008   Qualifier: Diagnosis of  By: [name]     Non morbid obesity due to excess calories 04/14/2015   Sleep apnea    Past Surgical History:  Procedure Laterality Date   CORONARY ARTERY BYPASS GRAFT     FINGER SURGERY Right    3rd   FINGER SURGERY Left    Thumb   Social History:  reports that he has quit smoking. He has never used smokeless tobacco. He reports that he does not drink alcohol and does not use drugs.  Allergies  Allergen Reactions   Crestor [Rosuvastatin] Other (See Comments)    Diarrhea   Morphine Nausea And Vomiting   Ramipril Cough   Ticlopidine Hcl      Neutropenia (low white blood count)    Family History  Problem Relation Age of Onset   Heart disease Mother    Heart failure Mother    Heart attack Father    Stroke Father     Prior to Admission medications   Medication Sig Start Date End Date Taking? Authorizing Provider  clopidogrel (PLAVIX) 75 MG tablet Take 1 tablet (75 mg total) by mouth daily. 07/21/21  Yes Revankar, Reita Cliche, MD  Coenzyme  Q-10 200 MG CAPS Take 200 mg by mouth daily.    Yes [provider]  ezetimibe (ZETIA) 10 MG tablet Take 1 tablet (10 mg total) by mouth daily. 07/21/21  Yes Revankar, Reita Cliche, MD  fenofibrate 160 MG tablet Take 1 tablet (160 mg total) by mouth daily. 07/21/21  Yes Revankar, Reita Cliche, MD  glimepiride (AMARYL) 2 MG tablet Take 2 mg by mouth daily with breakfast.    Yes [provider]  isosorbide mononitrate (IMDUR) 30 MG 24  hr tablet Take 1 tablet (30 mg total) by mouth daily. 07/21/21  Yes Revankar, Reita Cliche, MD  Melatonin 10 MG TBCR Take 10 mg by mouth at bedtime as needed (sleep).   Yes [provider]  metoprolol tartrate (LOPRESSOR) 25 MG tablet Take 0.5 tablets (12.5 mg total) by mouth 2 (two) times daily. 07/21/21  Yes Revankar, Reita Cliche, MD  Multiple Vitamins-Minerals (CENTRUM SILVER PO) Take 1 tablet by mouth daily.   Yes [provider]  Omega-3 Fatty Acids (FISH OIL TRIPLE STRENGTH) 1400 MG CAPS Take 1,400 mg by mouth daily.   Yes [provider]  pantoprazole (PROTONIX) 40 MG tablet Take 40 mg by mouth daily.    Yes [provider]  Pitavastatin Calcium (LIVALO) 4 MG TABS Take 1 tablet (4 mg total) by mouth daily. 07/21/21  Yes Revankar, Reita Cliche, MD  ranolazine (RANEXA) 500 MG 12 hr tablet Take 1 tablet (500 mg total) by mouth 2 (two) times daily. 07/21/21  Yes Revankar, Reita Cliche, MD  tamsulosin (FLOMAX) 0.4 MG CAPS capsule Take 0.4 mg by mouth daily. 08/15/19  Yes [provider]  TRULICITY 1.5 HM/0.9OB SOPN Inject 1.5 mg into the skin every Sunday. 04/13/20  Yes [provider]  nitroGLYCERIN (NITROSTAT) 0.4 MG SL tablet Place 1 tablet (0.4 mg total) under the tongue every 5 (five) minutes as needed for chest pain. 07/21/21   Revankar, Reita Cliche, MD  zolpidem (AMBIEN) 5 MG tablet Take 5 mg by mouth at bedtime as needed for sleep. 09/01/21   [provider]    Physical Exam: Vitals:   02/03/22 1610 02/03/22 1615 02/03/22 1630 02/03/22 1700  BP:  120/67 127/88 133/72  Pulse: 73 73 74 77  Resp: '12 14 14 15  '$ Temp:   98.1 F (36.7 C)   TempSrc:      SpO2: 96% 96% 94% 95%  Weight:      Height:       Gen Exam:Alert awake-not in any distress HEENT:atraumatic, normocephalic Chest: B/L clear to auscultation anteriorly CVS:S1S2 regular Abdomen:soft non tender, non distended Extremities:no edema Neurology: Non focal Skin: no rash   Data Reviewed:      Latest Ref Rng & Units 02/03/2022   12:45 PM 06/15/2021   10:44 AM 12/17/2020   11:02 AM  CBC  WBC 4.0 - 10.5 K/uL 6.6  4.8  4.5   Hemoglobin 13.0 - 17.0 g/dL 14.4  14.8  16.1   Hematocrit 39.0 - 52.0 % 45.4  43.9  47.3   Platelets 150 - 400 K/uL 198  175  198        Latest Ref Rng & Units 02/03/2022   12:45 PM 06/15/2021   10:44 AM 12/17/2020   11:02 AM  BMP  Glucose 70 - 99 mg/dL 154  122  98   BUN 8 - 23 mg/dL 32  29  22   Creatinine 0.61 - 1.24 mg/dL 1.90  2.00  1.77   BUN/Creat  Ratio 10 - '24  15  12   '$ Sodium 135 - 145 mmol/L 139  143  139   Potassium 3.5 - 5.1 mmol/L 5.1  4.8  4.3   Chloride 98 - 111 mmol/L 110  105  104   CO2 22 - 32 mmol/L '17  23  20   '$ Calcium 8.9 - 10.3 mg/dL 9.3  9.3  10.1        Family Communication: None at bedside  Thank you very much for involving [place] in the care of your patient.  Author: Oren Binet, MD 02/03/2022 5:27 PM  For on call review [email or web address].

## 2022-02-03 NOTE — Op Note (Signed)
02/03/2022  3:26 PM  PATIENT:  Glenn Roach  76 y.o. male  PRE-OPERATIVE DIAGNOSIS: Left ankle trimalleolar fracture  POST-OPERATIVE DIAGNOSIS: Same  Procedure(s): 1.  Open treatment left ankle trimalleolar fracture with internal fixation without fixation of the posterior lip 2.  Stress examination of the left ankle under fluoroscopy 3.  AP, lateral and mortise radiographs of the left ankle  SURGEON:  Wylene Simmer, MD  ASSISTANT: None  ANESTHESIA:   General, local  EBL:  minimal   TOURNIQUET:   Total Tourniquet Time Documented: Thigh (Left) - 33 minutes Total: Thigh (Left) - 33 minutes  COMPLICATIONS:  None apparent  DISPOSITION:  Extubated, awake and stable to recovery.  INDICATION FOR PROCEDURE: 76 year old male with past medical history significant for CAD, diabetes and chronic kidney disease injured his left ankle a week ago.  Radiographs reveal a displaced trimalleolar fracture.  He presents now for operative treatment of this unstable left ankle injury.  The risks and benefits of the alternative treatment options have been discussed in detail.  The patient wishes to proceed with surgery and specifically understands risks of bleeding, infection, nerve damage, blood clots, need for additional surgery, amputation and death.   PROCEDURE IN DETAIL:  After pre operative consent was obtained, and the correct operative site was identified, the patient was brought to the operating room and placed supine on the OR table.  Anesthesia was administered.  Pre-operative antibiotics were administered.  A surgical timeout was taken.  The left lower extremity was prepped and draped in standard sterile fashion with a tourniquet around the thigh.  The extremity was elevated and the tourniquet was inflated to 250 mmHg.  A longitudinal incision was then made over the lateral malleolus.  Dissection was carried down through the subcutaneous tissues.  The fracture site was identified.  It was  cleaned of all hematoma and irrigated.  The fracture was reduced and held with a lobster claw clamp.  A 3.5 mm lag screw was inserted from posterior to anterior across the fracture site.  It was noted to have adequate purchase and compressed the fracture site appropriately.  A 7 hole one third tubular plate from the [employer] small frag set was selected.  It was contoured to fit the lateral malleolus.  It was secured proximally with 3 bicortical screws and distally with 3 unicortical screws.  AP and lateral radiographs confirmed appropriate reduction of the lateral malleolus fracture.  Attention was turned to the medial side of the ankle where a longitudinal incision was made over the medial malleolus.  Appropriate reduction was confirmed.  2 K wires were inserted across the fracture site from the tip of the medial malleolus.  Radiographs confirmed appropriate position of both wires.  Both were overdrilled.  4 mm x 40 mm partially-threaded cannulated screws were inserted.  Both were noted to have adequate purchase and compressed the fracture site appropriately.  The K wires were removed.  Final AP, mortise and lateral radiographs confirmed appropriate reduction of the 3 fractures.  Hardware was appropriately positioned and of the appropriate lengths.  Stress examination was performed.  Dorsiflexion and external rotation stress was applied to the supinated forefoot with the ankle visualized in the mortise position.  No widening of the medial gutter or syndesmosis was noted.  Both wounds were irrigated copiously and sprinkled with vancomycin powder.  Subcutaneous tissues were approximated with 2-0 Vicryl.  Skin incisions were closed with nylon.  Sterile dressings were applied followed by a well-padded short leg  splint.  The tourniquet was released after application of the dressings.  The patient was awakened from anesthesia and transported to the recovery room in stable condition.   FOLLOW UP PLAN:  Nonweightbearing on the left lower extremity.  Observation for pain control and physical therapy.  Continue Plavix for DVT prophylaxis.   RADIOGRAPHS: AP, lateral and mortise radiographs of the left ankle are obtained intraoperatively.  These show interval reduction and fixation of the trimalleolar fracture.  Hardware is appropriately positioned and of the appropriate lengths.  No other acute injuries are noted.

## 2022-02-03 NOTE — Transfer of Care (Signed)
Immediate Anesthesia Transfer of Care Note  Patient: Glenn Roach  Procedure(s) Performed: OPEN REDUCTION INTERNAL FIXATION (ORIF) ANKLE FRACTURE TRIMALLEOLAR FRACTURE (Left: Ankle)  Patient Location: PACU  Anesthesia Type:General  Level of Consciousness: awake, alert  and oriented  Airway & Oxygen Therapy: Patient Spontanous Breathing and Patient connected to nasal cannula oxygen  Post-op Assessment: Report given to RN and Post -op Vital signs reviewed and stable  Post vital signs: Reviewed and stable  Last Vitals:  Vitals Value Taken Time  BP 132/68 02/03/22 1525  Temp    Pulse 74 02/03/22 1527  Resp 16 02/03/22 1527  SpO2 97 % 02/03/22 1527  Vitals shown include unvalidated device data.  Last Pain:  Vitals:   02/03/22 1256  TempSrc:   PainSc: 0-No pain      Patients Stated Pain Goal: 3 (62/86/38 1771)  Complications: No notable events documented.

## 2022-02-03 NOTE — Anesthesia Procedure Notes (Signed)
Procedure Name: LMA Insertion Date/Time: 02/03/2022 2:28 PM  Performed by: Dorthea Cove, CRNAPre-anesthesia Checklist: Patient identified, Emergency Drugs available, Suction available and Patient being monitored Patient Re-evaluated:Patient Re-evaluated prior to induction Oxygen Delivery Method: Circle System Utilized Preoxygenation: Pre-oxygenation with 100% oxygen Induction Type: IV induction Ventilation: Mask ventilation without difficulty LMA: LMA inserted LMA Size: 4.0 Number of attempts: 1 Airway Equipment and Method: Bite block Placement Confirmation: positive ETCO2 Tube secured with: Tape Dental Injury: Teeth and Oropharynx as per pre-operative assessment

## 2022-02-03 NOTE — Discharge Instructions (Signed)
Wylene Simmer, MD [facility]  Please read the following information regarding your care after surgery.  Medications  You only need a prescription for the narcotic pain medicine (ex. oxycodone, Percocet, Norco).  All of the other medicines listed below are available over the counter. X Norco as prescribed for severe pain  Narcotic pain medicine (ex. oxycodone, Percocet, Vicodin) will cause constipation.  To prevent this problem, take the following medicines while you are taking any pain medicine. X docusate sodium (Colace) 100 mg twice a day X senna (Senokot) 2 tablets twice a day  X To help prevent blood clots, resume your Plavix after surgery.  You should also get up every hour while you are awake to move around.    Weight Bearing X Do not bear any weight on the operated leg or foot.  Cast / Splint / Dressing X Keep your splint, cast or dressing clean and dry.  Don't put anything (coat hanger, pencil, etc) down inside of it.  If it gets damp, use a hair dryer on the cool setting to dry it.  If it gets soaked, call the office to schedule an appointment for a cast change.  After your dressing, cast or splint is removed; you may shower, but do not soak or scrub the wound.  Allow the water to run over it, and then gently pat it dry.  Swelling It is normal for you to have swelling where you had surgery.  To reduce swelling and pain, keep your toes above your nose for at least 3 days after surgery.  It may be necessary to keep your foot or leg elevated for several weeks.  If it hurts, it should be elevated.  Follow Up Call my office at [phone] when you are discharged from the hospital or surgery center to schedule an appointment to be seen two weeks after surgery.  Call my office at [phone] if you develop a fever >101.5 F, nausea, vomiting, bleeding from the surgical site or severe pain.

## 2022-02-03 NOTE — H&P (Signed)
Glenn Roach is an 76 y.o. male.   Chief Complaint: Left ankle pain HPI: 76 year old male with past medical history significant for diabetes and CAD complains of left ankle pain for the last week.  He fell at home when he slipped on the stairs.  He walked on the ankle for several days until x-rays were obtained 6 days ago.  These show a displaced trimalleolar fracture.  He presents now for open treatment of this unstable left ankle injury with internal fixation.  Past Medical History:  Diagnosis Date   CAD (coronary artery disease)    Chest tightness    Coronary artery disease involving coronary bypass graft of native heart with angina pectoris ([place]) 10/28/2008   Qualifier: Diagnosis of  By: [name]     Diabetes mellitus due to underlying condition with unspecified complications ([place]) 93/26/7124   Qualifier: Diagnosis of  By: [name]     Dyslipidemia    Essential hypertension 04/14/2015   GERD (gastroesophageal reflux disease)    History of kidney stones    Mild renal insufficiency 04/14/2015   Mixed dyslipidemia 01/19/2017   NEPHROLITHIASIS, HX OF 10/28/2008   Qualifier: Diagnosis of  By: [name]     Non morbid obesity due to excess calories 04/14/2015   Sleep apnea     Past Surgical History:  Procedure Laterality Date   CORONARY ARTERY BYPASS GRAFT     FINGER SURGERY Right    3rd   FINGER SURGERY Left    Thumb    Family History  Problem Relation Age of Onset   Heart disease Mother    Heart failure Mother    Heart attack Father    Stroke Father    Social History:  reports that he has quit smoking. He has never used smokeless tobacco. He reports that he does not drink alcohol and does not use drugs.  Allergies:  Allergies  Allergen Reactions   Crestor [Rosuvastatin] Other (See Comments)    Diarrhea   Morphine Nausea And Vomiting   Ramipril Cough   Ticlopidine Hcl      Neutropenia (low white blood count)    Medications Prior to Admission   Medication Sig Dispense Refill   clopidogrel (PLAVIX) 75 MG tablet Take 1 tablet (75 mg total) by mouth daily. 90 tablet 2   Coenzyme Q-10 200 MG CAPS Take 200 mg by mouth daily.      ezetimibe (ZETIA) 10 MG tablet Take 1 tablet (10 mg total) by mouth daily. 90 tablet 2   fenofibrate 160 MG tablet Take 1 tablet (160 mg total) by mouth daily. 90 tablet 2   glimepiride (AMARYL) 2 MG tablet Take 2 mg by mouth daily with breakfast.      isosorbide mononitrate (IMDUR) 30 MG 24 hr tablet Take 1 tablet (30 mg total) by mouth daily. 90 tablet 2   Melatonin 10 MG TBCR Take 10 mg by mouth at bedtime as needed (sleep).     metoprolol tartrate (LOPRESSOR) 25 MG tablet Take 0.5 tablets (12.5 mg total) by mouth 2 (two) times daily. 90 tablet 2   Multiple Vitamins-Minerals (CENTRUM SILVER PO) Take 1 tablet by mouth daily.     Omega-3 Fatty Acids (FISH OIL TRIPLE STRENGTH) 1400 MG CAPS Take 1,400 mg by mouth daily.     pantoprazole (PROTONIX) 40 MG tablet Take 40 mg by mouth daily.      Pitavastatin Calcium (LIVALO) 4 MG TABS Take 1 tablet (4 mg total) by mouth daily. [place]  tablet 2   ranolazine (RANEXA) 500 MG 12 hr tablet Take 1 tablet (500 mg total) by mouth 2 (two) times daily. 180 tablet 2   tamsulosin (FLOMAX) 0.4 MG CAPS capsule Take 0.4 mg by mouth daily.     TRULICITY 1.5 BE/6.7JQ SOPN Inject 1.5 mg into the skin every Sunday.     nitroGLYCERIN (NITROSTAT) 0.4 MG SL tablet Place 1 tablet (0.4 mg total) under the tongue every 5 (five) minutes as needed for chest pain. 25 tablet 10   zolpidem (AMBIEN) 5 MG tablet Take 5 mg by mouth at bedtime as needed for sleep.      Results for orders placed or performed during the hospital encounter of Feb 06, 2022 (from the past 48 hour(s))  Glucose, capillary     Status: Abnormal   Collection Time: 2022/02/06 12:18 PM  Result Value Ref Range   Glucose-Capillary 164 (H) 70 - 99 mg/dL    Comment: Glucose reference range applies only to samples taken after fasting for at  least 8 hours.  CBC per protocol     Status: None   Collection Time: 06-Feb-2022 12:45 PM  Result Value Ref Range   WBC 6.6 4.0 - 10.5 K/uL   RBC 4.77 4.22 - 5.81 MIL/uL   Hemoglobin 14.4 13.0 - 17.0 g/dL   HCT 45.4 39.0 - 52.0 %   MCV 95.2 80.0 - 100.0 fL   MCH 30.2 26.0 - 34.0 pg   MCHC 31.7 30.0 - 36.0 g/dL   RDW 13.3 11.5 - 15.5 %   Platelets 198 150 - 400 K/uL   nRBC 0.0 0.0 - 0.2 %    Comment: Performed at [facility], [place] [address], [place], [place] [address]   No results found.  Review of Systems no recent fever, chills, nausea, vomiting or changes in his appetite  Blood pressure 137/67, pulse 75, temperature 97.6 F (36.4 C), temperature source Oral, resp. rate 17, height '5\' 9"'$  (1.753 m), weight 87.1 kg, SpO2 98 %. Physical Exam  Well-nourished well-developed man in no apparent distress.  Alert and oriented x4.  Normal mood and affect.  Gait is antalgic on the left.  Skin is healthy and intact.  Pulses are palpable in the foot.  No lymphadenopathy.  Intact sensibility to light touch dorsally and plantarly at the forefoot.   Assessment/Plan Left ankle trimalleolar fracture -to the operating room today for surgical treatment.  The risks and benefits of the alternative treatment options have been discussed in detail.  The patient wishes to proceed with surgery and specifically understands risks of bleeding, infection, nerve damage, blood clots, need for additional surgery, amputation and death.   Wylene Simmer, MD 2022-02-06, 1:45 PM

## 2022-02-04 DIAGNOSIS — I10 Essential (primary) hypertension: Secondary | ICD-10-CM | POA: Diagnosis not present

## 2022-02-04 DIAGNOSIS — E1122 Type 2 diabetes mellitus with diabetic chronic kidney disease: Secondary | ICD-10-CM | POA: Diagnosis not present

## 2022-02-04 DIAGNOSIS — N189 Chronic kidney disease, unspecified: Secondary | ICD-10-CM | POA: Diagnosis not present

## 2022-02-04 DIAGNOSIS — N1832 Chronic kidney disease, stage 3b: Secondary | ICD-10-CM | POA: Diagnosis not present

## 2022-02-04 DIAGNOSIS — I129 Hypertensive chronic kidney disease with stage 1 through stage 4 chronic kidney disease, or unspecified chronic kidney disease: Secondary | ICD-10-CM | POA: Diagnosis not present

## 2022-02-04 DIAGNOSIS — S82852A Displaced trimalleolar fracture of left lower leg, initial encounter for closed fracture: Secondary | ICD-10-CM | POA: Diagnosis not present

## 2022-02-04 DIAGNOSIS — Z951 Presence of aortocoronary bypass graft: Secondary | ICD-10-CM | POA: Diagnosis not present

## 2022-02-04 DIAGNOSIS — E088 Diabetes mellitus due to underlying condition with unspecified complications: Secondary | ICD-10-CM | POA: Diagnosis not present

## 2022-02-04 DIAGNOSIS — I2510 Atherosclerotic heart disease of native coronary artery without angina pectoris: Secondary | ICD-10-CM | POA: Diagnosis not present

## 2022-02-04 LAB — GLUCOSE, CAPILLARY
Glucose-Capillary: 139 mg/dL — ABNORMAL HIGH (ref 70–99)
Glucose-Capillary: 232 mg/dL — ABNORMAL HIGH (ref 70–99)
Glucose-Capillary: 190 mg/dL — ABNORMAL HIGH (ref 70–99)
Glucose-Capillary: 197 mg/dL — ABNORMAL HIGH (ref 70–99)

## 2022-02-04 LAB — CBC
HCT: 40.4 % (ref 39.0–52.0)
Hemoglobin: 13.3 g/dL (ref 13.0–17.0)
MCH: 30.5 pg (ref 26.0–34.0)
MCHC: 32.9 g/dL (ref 30.0–36.0)
MCV: 92.7 fL (ref 80.0–100.0)
Platelets: 191 10*3/uL (ref 150–400)
RBC: 4.36 MIL/uL (ref 4.22–5.81)
RDW: 13.1 % (ref 11.5–15.5)
WBC: 8.2 10*3/uL (ref 4.0–10.5)
nRBC: 0 % (ref 0.0–0.2)

## 2022-02-04 LAB — BASIC METABOLIC PANEL
Anion gap: 5 (ref 5–15)
BUN: 26 mg/dL — ABNORMAL HIGH (ref 8–23)
CO2: 23 mmol/L (ref 22–32)
Calcium: 9.1 mg/dL (ref 8.9–10.3)
Chloride: 110 mmol/L (ref 98–111)
Creatinine, Ser: 1.73 mg/dL — ABNORMAL HIGH (ref 0.61–1.24)
GFR, Estimated: 40 mL/min — ABNORMAL LOW (ref >60)
Glucose, Bld: 150 mg/dL — ABNORMAL HIGH (ref 70–99)
Potassium: 4.7 mmol/L (ref 3.5–5.1)
Sodium: 138 mmol/L (ref 135–145)

## 2022-02-04 MED ORDER — OXYCODONE HCL 5 MG PO TABS: 5.0000 mg | ORAL_TABLET | ORAL | 0 refills | Status: AC | PRN

## 2022-02-04 MED ORDER — DOCUSATE SODIUM 100 MG PO CAPS: 100.0000 mg | ORAL_CAPSULE | Freq: Two times a day (BID) | ORAL | 0 refills | Status: DC

## 2022-02-04 MED ORDER — SENNA 8.6 MG PO TABS: 2.0000 | ORAL_TABLET | Freq: Two times a day (BID) | ORAL | 0 refills | Status: DC

## 2022-02-04 NOTE — Care Management Obs Status (Signed)
[name] NOTIFICATION   Patient Details  Name: Glenn Roach MRN: [id number] Date of Birth: 06-Jun-1946   Medicare Observation Status Notification Given:  Yes    Joanne Chars, LCSW 02/04/2022, 2:35 PM

## 2022-02-04 NOTE — Progress Notes (Signed)
Inpatient Diabetes Program Recommendations  AACE/ADA: New Consensus Statement on Inpatient Glycemic Control (2015)  Target Ranges:  Prepandial:   less than 140 mg/dL      Peak postprandial:   less than 180 mg/dL (1-2 hours)      Critically ill patients:  140 - 180 mg/dL   Lab Results  Component Value Date   GLUCAP 139 (H) 02/04/2022   HGBA1C 7.0 (H) 02/03/2022    Review of Glycemic Control  Latest Reference Range & Units 02/03/22 15:25 02/03/22 19:18 02/03/22 20:54 02/04/22 08:01  Glucose-Capillary 70 - 99 mg/dL 108 (H) 151 (H) 273 (H) 139 (H)   Diabetes history: DM 2 Outpatient Diabetes medications: Amaryl 2 mg daily, Trulicity 1.5 mg q Sunday Current orders for Inpatient glycemic control:  Novolog 0-15 units tid with meals  Inpatient Diabetes Program Recommendations:    Note blood glucose up last night after receiving Decadron 5 mgx1 in surgery.  Blood glucose improved this AM.  A1C is at goal. Will follow.  Thanks,  Adah Perl, RN, BC-ADM Inpatient Diabetes Coordinator Pager [phone]  (8a-5p)

## 2022-02-04 NOTE — TOC Initial Note (Addendum)
Transition of Care [facility]) - Initial/Assessment Note    Patient Details  Name: Glenn Roach MRN: [id number] Date of Birth: 09/04/1945  Transition of [facility]) CM/SW Contact:    Joanne Chars, LCSW Phone Number: 02/04/2022, 2:09 PM  Clinical Narrative:    CSW met with pt regarding PT recommendation for SNF.  Pt is observation status with medicare A and B, discussed need for 3 day inpatient stay, which pt was aware of.  Pt does want SNF, wants [facility] or [facility].  Pt lives home with wife Glenn Roach, who cannot provide much physical support.  Permission given to speak with wife and two daughters, Glenn Roach and Glenn Roach.   Pt is vaccinate for covid with one booster.  CSW spoke with UR, pt will not be switched to inpatient will remain obs.   CSW spoke with Willette/admissions at [facility] and she called back with self pay estimate of $510 per day.  Discussed all of this with pt, he is willing to self pay for SNF.  Referral sent out in hub, [facility] will review, [facility], then [facility].    1445: TC [facility].  [facility] building is full, pt is accepted to [facility], CSW confirmed with pt and he does want to accept this offer.  Willette said pt can be admitted on Sunday, they will work out the finances with pt at that time.              Expected Discharge Plan: Skilled Nursing Facility Barriers to Discharge: Continued Medical Work up, SNF Pending bed offer   Patient Goals and CMS Choice Patient states their goals for this hospitalization and ongoing recovery are:: get back to full activity   Choice offered to / list presented to : Patient  Expected Discharge Plan and Services Expected Discharge Plan: [place] In-house Referral: Clinical Social Work   Post Acute Care Choice: [place] Living arrangements for the past 2 months: [place]                                      Prior Living Arrangements/Services Living  arrangements for the past 2 months: Single Family Home Lives with:: Spouse Patient language and need for interpreter reviewed:: Yes Do you feel safe going back to the place where you live?: Yes      Need for Family Participation in Patient Care: No (Comment) Care giver support system in place?: Yes (comment) Current home services: Other (comment) (none) Criminal Activity/Legal Involvement Pertinent to Current Situation/Hospitalization: No - Comment as needed  Activities of Daily Living Home Assistive Devices/Equipment: CBG Meter, Blood pressure cuff, Crutches, Walker (specify type), Other (Comment) (cam boot) ADL Screening (condition at time of admission) Patient's cognitive ability adequate to safely complete daily activities?: Yes Is the patient deaf or have difficulty hearing?: Yes (aids) Does the patient have difficulty seeing, even when wearing glasses/contacts?: No Does the patient have difficulty concentrating, remembering, or making decisions?: No Patient able to express need for assistance with ADLs?: Yes Does the patient have difficulty dressing or bathing?: No Independently performs ADLs?: Yes (appropriate for developmental age) Does the patient have difficulty walking or climbing stairs?: Yes (Yes d/t pain and soreness) Weakness of Legs: None Weakness of Arms/Hands: None  Permission Sought/Granted Permission sought to share information with : Family Supports Permission granted to share information with : Yes, Verbal Permission Granted  Share  Information with NAME: wife Glenn Roach, daughters Glenn Roach and Glenn Roach  Permission granted to share info w AGENCY: SNF        Emotional Assessment Appearance:: Appears stated age Attitude/Demeanor/Rapport: Engaged Affect (typically observed): Appropriate, Pleasant Orientation: : Oriented to Self, Oriented to Place, Oriented to  Time, Oriented to Situation Alcohol / Substance Use: Not Applicable Psych Involvement: No (comment)  Admission  diagnosis:  Trimalleolar fracture of ankle, closed, left, initial encounter [E49.753Y] Patient Active Problem List   Diagnosis Date Noted   Trimalleolar fracture of ankle, closed, left, initial encounter 02/03/2022   CKD (chronic Roach disease) stage 3, GFR 30-59 ml/min ([place]) 02/03/2022   CAD (coronary artery disease)    Chest tightness    Dyslipidemia    Mixed dyslipidemia 01/19/2017   Essential hypertension 04/14/2015   Mild renal insufficiency 04/14/2015   Non morbid obesity due to excess calories 04/14/2015   Diabetes mellitus due to underlying condition with unspecified complications ([place]) 11/18/209   Coronary artery disease involving coronary bypass graft of native heart with angina pectoris ([place]) 10/28/2008   NEPHROLITHIASIS, HX OF 10/28/2008   PCP:  Mateo Flow, MD Pharmacy:   [facility], [place] [address] Phone: [phone] Fax: [phone]     Social Determinants of Health (SDOH) Interventions    Readmission Risk Interventions     No data to display

## 2022-02-04 NOTE — Plan of Care (Signed)
  Problem: Acute Rehab OT Goals (only OT should resolve) Goal: Pt. Will Perform Lower Body Dressing Flowsheets (Taken 02/04/2022 1441) Pt Will Perform Lower Body Dressing:  with set-up  with adaptive equipment  sit to/from stand  sitting/lateral leans Goal: Pt. Will Transfer To Toilet Flowsheets (Taken 02/04/2022 1441) Pt Will Transfer to Toilet:  with supervision  ambulating  bedside commode  regular height toilet Goal: Pt. Will Perform Toileting-Clothing Manipulation Flowsheets (Taken 02/04/2022 1441) Pt Will Perform Toileting - Clothing Manipulation and hygiene:  with supervision  sitting/lateral leans  sit to/from stand Goal: Pt. Will Perform Tub/Shower Transfer Flowsheets (Taken 02/04/2022 1441) Pt Will Perform Tub/Shower Transfer:  with min guard assist  Shower transfer  anterior/posterior transfer  ambulating  shower seat  rolling walker Note: Walk-in shower Goal: Pt/Caregiver Will Perform Home Exercise Program Flowsheets (Taken 02/04/2022 1441) Pt/caregiver will Perform Home Exercise Program:  Increased strength  Both right and left upper extremity  With theraband  Independently  With written HEP provided

## 2022-02-04 NOTE — Consult Note (Signed)
PROGRESS NOTE        PATIENT DETAILS Name: Glenn Roach Age: 76 y.o. Sex: male Date of Birth: 1946/06/30 Admit Date: 02/03/2022 Admitting Physician Wylene Simmer, MD EHU:DJSH, Elyse Jarvis, MD  Brief Summary: Patient is a 76 y.o.  male with past medical history of DM-2, HTN, HLD, CAD s/p CABG 2012-who sustained a mechanical fall-and was found to have a left ankle fracture, he underwent ORIF RIF on 7/27-TRH consulted for medical management.  Subjective: Lying comfortably in bed-denies any chest pain or shortness of breath.  Objective: Vitals: Blood pressure (!) 149/68, pulse 65, temperature 97.9 F (36.6 C), temperature source Oral, resp. rate 16, height '5\' 9"'$  (1.753 m), weight 87 kg, SpO2 97 %.   Exam: Gen Exam:Alert awake-not in any distress HEENT:atraumatic, normocephalic Chest: B/L clear to auscultation anteriorly CVS:S1S2 regular Abdomen:soft non tender, non distended Extremities:no edema Neurology: Non focal Skin: no rash  Pertinent Labs/Radiology:    Latest Ref Rng & Units 02/04/2022    7:35 AM 02/03/2022   12:45 PM 06/15/2021   10:44 AM  CBC  WBC 4.0 - 10.5 K/uL 8.2  6.6  4.8   Hemoglobin 13.0 - 17.0 g/dL 13.3  14.4  14.8   Hematocrit 39.0 - 52.0 % 40.4  45.4  43.9   Platelets 150 - 400 K/uL 191  198  175     Lab Results  Component Value Date   NA 138 02/04/2022   K 4.7 02/04/2022   CL 110 02/04/2022   CO2 23 02/04/2022      Assessment/Plan: Displaced trimalleolar fracture of left ankle: S/p ORIF-defer to primary service.   DM-2: CBG stable with SSI.  Resume Amaryl/Trulicity on discharge.  Recent Labs    02/03/22 1918 02/03/22 2054 02/04/22 0801  GLUCAP 151* 273* 139*      HTN: Continue nitrates/metoprolol-follow/optimize.     HLD: Continue Zetia/fenofibrate/statin.   CAD s/p CABG 2012: No overt anginal symptoms-seems to have tolerated ORIF pretty well.  Not on aspirin at home.  Continue Plavix/statin/beta-blocker and  Ranexa.  Follows with Dr. Rico Junker cardiology in [place].   CKD stage IIIb: Creatinine close to baseline-follow.   GERD: Continue PPI   BPH: Continue Flomax   TRH will continue to follow the patient.  Remains medically stable for discharge whenever felt appropriate by primary service.  BMI: Estimated body mass index is 28.32 kg/m as calculated from the following:   Height as of this encounter: '5\' 9"'$  (1.753 m).   Weight as of this encounter: 87 kg.   Code status:   Code Status: Full Code   DVT Prophylaxis: SCDs Start: 02/03/22 1834    Diet: Diet Order             Diet Carb Modified Fluid consistency: Thin; Room service appropriate? Yes  Diet effective now                     Antimicrobial agents: Anti-infectives (From admission, onward)    Start     Dose/Rate Route Frequency Ordered Stop   02/03/22 1515  vancomycin (VANCOCIN) powder  Status:  Discontinued          As needed 02/03/22 1515 02/03/22 1521        MEDICATIONS: Scheduled Meds:  clopidogrel  75 mg Oral Daily   docusate sodium  100 mg Oral BID  ezetimibe  10 mg Oral Daily   fenofibrate  160 mg Oral Daily   insulin aspart  0-15 Units Subcutaneous TID WC   isosorbide mononitrate  30 mg Oral Daily   metoprolol tartrate  12.5 mg Oral BID   multivitamin with minerals  1 tablet Oral Daily   pantoprazole  40 mg Oral Daily   pravastatin  40 mg Oral q1800   ranolazine  500 mg Oral BID   tamsulosin  0.4 mg Oral Daily   Continuous Infusions:  dextrose 5 % and 0.45 % NaCl with KCl 20 mEq/L 50 mL/hr at 02/03/22 2131   PRN Meds:.acetaminophen, bisacodyl, hydrALAZINE, HYDROcodone-acetaminophen, HYDROcodone-acetaminophen, melatonin, metoCLOPramide **OR** metoCLOPramide (REGLAN) injection, morphine injection, ondansetron **OR** ondansetron (ZOFRAN) IV, polyethylene glycol, sodium phosphate   I have personally reviewed following labs and imaging studies  LABORATORY DATA: CBC: Recent Labs  Lab  02/03/22 1245 02/04/22 0735  WBC 6.6 8.2  HGB 14.4 13.3  HCT 45.4 40.4  MCV 95.2 92.7  PLT 198 287    Basic Metabolic Panel: Recent Labs  Lab 02/03/22 1245 02/04/22 0735  NA 139 138  K 5.1 4.7  CL 110 110  CO2 17* 23  GLUCOSE 154* 150*  BUN 32* 26*  CREATININE 1.90* 1.73*  CALCIUM 9.3 9.1    GFR: Estimated Creatinine Clearance: 39 mL/min (A) (by C-G formula based on SCr of 1.73 mg/dL (H)).  Liver Function Tests: No results for input(s): "AST", "ALT", "ALKPHOS", "BILITOT", "PROT", "ALBUMIN" in the last 168 hours. No results for input(s): "LIPASE", "AMYLASE" in the last 168 hours. No results for input(s): "AMMONIA" in the last 168 hours.  Coagulation Profile: No results for input(s): "INR", "PROTIME" in the last 168 hours.  Cardiac Enzymes: No results for input(s): "CKTOTAL", "CKMB", "CKMBINDEX", "TROPONINI" in the last 168 hours.  BNP (last 3 results) No results for input(s): "PROBNP" in the last 8760 hours.  Lipid Profile: No results for input(s): "CHOL", "HDL", "LDLCALC", "TRIG", "CHOLHDL", "LDLDIRECT" in the last 72 hours.  Thyroid Function Tests: No results for input(s): "TSH", "T4TOTAL", "FREET4", "T3FREE", "THYROIDAB" in the last 72 hours.  Anemia Panel: No results for input(s): "VITAMINB12", "FOLATE", "FERRITIN", "TIBC", "IRON", "RETICCTPCT" in the last 72 hours.  Urine analysis:    Component Value Date/Time   COLORURINE YELLOW 09/09/2010 0007   APPEARANCEUR CLEAR 09/09/2010 0007   LABSPEC 1.018 09/09/2010 0007   PHURINE 6.0 09/09/2010 0007   HGBUR NEGATIVE 09/09/2010 0007   BILIRUBINUR NEGATIVE 09/09/2010 0007   KETONESUR NEGATIVE 09/09/2010 0007   PROTEINUR NEGATIVE 09/09/2010 0007   UROBILINOGEN 1.0 09/09/2010 0007   NITRITE NEGATIVE 09/09/2010 0007   LEUKOCYTESUR  09/09/2010 0007    NEGATIVE MICROSCOPIC NOT DONE ON URINES WITH NEGATIVE PROTEIN, BLOOD, LEUKOCYTES, NITRITE, OR GLUCOSE <1000 mg/dL.    Sepsis Labs: Lactic Acid, Venous No  results found for: "LATICACIDVEN"  MICROBIOLOGY: Recent Results (from the past 240 hour(s))  Surgical pcr screen     Status: None   Collection Time: 02/03/22 12:39 PM   Specimen: Nasal Mucosa; Nasal Swab  Result Value Ref Range Status   MRSA, PCR NEGATIVE NEGATIVE Final   Staphylococcus aureus NEGATIVE NEGATIVE Final    Comment: (NOTE) The Xpert SA Assay (FDA approved for NASAL specimens in patients 50 years of age and older), is one component of a comprehensive surveillance program. It is not intended to diagnose infection nor to guide or monitor treatment. Performed at [facility], [place] [address] [place], [place] [address]  RADIOLOGY STUDIES/RESULTS: DG MINI C-ARM IMAGE ONLY  Result Date: 02/03/2022 There is no interpretation for this exam.  This order is for images obtained during a surgical procedure.  Please See "Surgeries" Tab for more information regarding the procedure.     LOS: 0 days   Oren Binet, MD  [facility]    To contact the attending provider between 7A-7P or the covering provider during after hours 7P-7A, please log into the web site [email or web address] and access using universal [place] password for that web site. If you do not have the password, please call the hospital operator.  02/04/2022, 9:07 AM

## 2022-02-04 NOTE — NC FL2 (Signed)
[place] LEVEL OF CARE SCREENING TOOL     IDENTIFICATION  Patient Name: Glenn Roach Birthdate: 1946-06-19 Sex: male Admission Date (Current Location): 02/03/2022  [facility] and [place] Number:  Herbalist and Address:  [facility], [place] [address] [place], [place] [address]      Provider Number: [id number]  Attending Physician Name and Address:  Wylene Simmer, MD  Relative Name and Phone Number:  [place], [place] [phone]    Current Level of Care: Hospital Recommended Level of Care: [place] Prior Approval Number:    Date Approved/Denied:   PASRR Number: [id number]  Discharge Plan: SNF    Current Diagnoses: Patient Active Problem List   Diagnosis Date Noted   Trimalleolar fracture of ankle, closed, left, initial encounter 02/03/2022   CKD (chronic kidney disease) stage 3, GFR 30-59 ml/min ([place]) 02/03/2022   CAD (coronary artery disease)    Chest tightness    Dyslipidemia    Mixed dyslipidemia 01/19/2017   Essential hypertension 04/14/2015   Mild renal insufficiency 04/14/2015   Non morbid obesity due to excess calories 04/14/2015   Diabetes mellitus due to underlying condition with unspecified complications ([place]) 01/77/9390   Coronary artery disease involving coronary bypass graft of native heart with angina pectoris ([place]) 10/28/2008   NEPHROLITHIASIS, HX OF 10/28/2008    Orientation RESPIRATION BLADDER Height & Weight     Self, Time, Situation, Place  Normal Continent Weight: 191 lb 12.8 oz (87 kg) Height:  '5\' 9"'$  (175.3 cm)  BEHAVIORAL SYMPTOMS/MOOD NEUROLOGICAL BOWEL NUTRITION STATUS      Continent Diet (see discharge summary)  AMBULATORY STATUS COMMUNICATION OF NEEDS Skin   Limited Assist Verbally Surgical wounds                       Personal Care Assistance Level of Assistance  Bathing, Feeding, Dressing Bathing Assistance: Limited assistance Feeding assistance:  Independent Dressing Assistance: Limited assistance     Functional Limitations Info  Sight, Hearing, Speech Sight Info: Adequate Hearing Info: Adequate Speech Info: Adequate    SPECIAL CARE FACTORS FREQUENCY  PT (By licensed PT), OT (By licensed OT)     PT Frequency: 5x week OT Frequency: 5x week            Contractures Contractures Info: Not present    Additional Factors Info  Code Status, Allergies, Insulin Sliding Scale Code Status Info: full Allergies Info: Crestor (Rosuvastatin), Morphine, Ramipril, Ticlopidine Hcl   Insulin Sliding Scale Info: Novolog, see discharge summary       Current Medications (02/04/2022):  This is the current hospital active medication list Current Facility-Administered Medications  Medication Dose Route Frequency Provider Last Rate Last Admin   acetaminophen (TYLENOL) tablet 325-650 mg  325-650 mg Oral Q6H PRN Wylene Simmer, MD       bisacodyl (DULCOLAX) suppository 10 mg  10 mg Rectal Daily PRN Wylene Simmer, MD       clopidogrel (PLAVIX) tablet 75 mg  75 mg Oral Daily Jonetta Osgood, MD   75 mg at 02/04/22 0850   dextrose 5 % and 0.45 % NaCl with KCl 20 mEq/L infusion   Intravenous Continuous Wylene Simmer, MD 50 mL/hr at 02/03/22 2131 New Bag at 02/03/22 2131   docusate sodium (COLACE) capsule 100 mg  100 mg Oral BID Wylene Simmer, MD   100 mg at 02/04/22 0847   ezetimibe (ZETIA) tablet 10 mg  10 mg Oral Daily Ghimire, Shanker  M, MD   10 mg at 02/04/22 0848   fenofibrate tablet 160 mg  160 mg Oral Daily Jonetta Osgood, MD   160 mg at 02/04/22 0849   hydrALAZINE (APRESOLINE) injection 10 mg  10 mg Intravenous Q6H PRN Jonetta Osgood, MD       HYDROcodone-acetaminophen (NORCO) 7.5-325 MG per tablet 1-2 tablet  1-2 tablet Oral Q4H PRN Wylene Simmer, MD   2 tablet at 02/04/22 0347   HYDROcodone-acetaminophen (NORCO/VICODIN) 5-325 MG per tablet 1-2 tablet  1-2 tablet Oral Q4H PRN Wylene Simmer, MD   2 tablet at 02/04/22 1015   insulin  aspart (novoLOG) injection 0-15 Units  0-15 Units Subcutaneous TID WC Wylene Simmer, MD   5 Units at 02/04/22 1241   isosorbide mononitrate (IMDUR) 24 hr tablet 30 mg  30 mg Oral Daily Jonetta Osgood, MD   30 mg at 02/04/22 0848   melatonin tablet 10 mg  10 mg Oral QHS PRN Ghimire, Henreitta Leber, MD       metoCLOPramide (REGLAN) tablet 5-10 mg  5-10 mg Oral Q8H PRN Wylene Simmer, MD       Or   metoCLOPramide (REGLAN) injection 5-10 mg  5-10 mg Intravenous Q8H PRN Wylene Simmer, MD       metoprolol tartrate (LOPRESSOR) tablet 12.5 mg  12.5 mg Oral BID Jonetta Osgood, MD   12.5 mg at 02/04/22 0849   morphine (PF) 2 MG/ML injection 0.5-1 mg  0.5-1 mg Intravenous Q2H PRN Wylene Simmer, MD       multivitamin with minerals tablet 1 tablet  1 tablet Oral Daily Jonetta Osgood, MD   1 tablet at 02/04/22 0847   ondansetron (ZOFRAN) tablet 4 mg  4 mg Oral Q6H PRN Wylene Simmer, MD       Or   ondansetron [facility]) injection 4 mg  4 mg Intravenous Q6H PRN Wylene Simmer, MD       pantoprazole (PROTONIX) EC tablet 40 mg  40 mg Oral Daily Jonetta Osgood, MD   40 mg at 02/04/22 0848   polyethylene glycol (MIRALAX / GLYCOLAX) packet 17 g  17 g Oral Daily PRN Wylene Simmer, MD       pravastatin (PRAVACHOL) tablet 40 mg  40 mg Oral q1800 Jonetta Osgood, MD   40 mg at 02/03/22 2037   ranolazine (RANEXA) 12 hr tablet 500 mg  500 mg Oral BID Jonetta Osgood, MD   500 mg at 02/04/22 0850   sodium phosphate (FLEET) 7-19 GM/118ML enema 1 enema  1 enema Rectal Once PRN Wylene Simmer, MD       tamsulosin [facility]) capsule 0.4 mg  0.4 mg Oral Daily Jonetta Osgood, MD   0.4 mg at 02/04/22 0814     Discharge Medications: Please see discharge summary for a list of discharge medications.  Relevant Imaging Results:  Relevant Lab Results:   Additional Information SSN: [id number], pt reports he is vaccinated for covid with at least one booster  Joanne Chars, LCSW

## 2022-02-04 NOTE — Evaluation (Signed)
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 He subsequently developed left ankle pain/swelling but did not have xray until 6 days after the fall. Xray revealed displaced L ankle fx. He underwent ORIF 7/27. 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 PTA pt lived at home with wife, active and independent. Pt currently with functional limitations due to the deficits listed below (see PT Problem List). On eval, Pt required min assist transfers and min assist ambulation 12' x 2 with RW. Pt demonstrates good ability to maintain NWB LLE but does fatigue quickly due to hop-to gait pattern. Pt will benefit from skilled PT to increase their independence and safety with mobility to allow discharge to the venue listed below. Pt will require ST SNF stay due to inaccessible home environment and wife's inability to provide needed level of assist. Pt has 3 steps to enter his 2-story house and additional flight of stairs to access bedroom and full bathroom.        Recommendations for follow up therapy are one component of a multi-disciplinary discharge planning process, led by the attending physician.  Recommendations may be updated based on patient status, additional functional criteria and insurance authorization.  Follow Up Recommendations Skilled nursing-short term rehab (<3 hours/day) Can patient physically be transported by private vehicle: Yes    Assistance Recommended at Discharge Intermittent Supervision/Assistance  Patient can return home with the following  A little help with walking and/or transfers;Assistance with cooking/housework;Assist for transportation;A little help with bathing/dressing/bathroom;Help with stairs  or ramp for entrance    Equipment Recommendations Wheelchair (measurements PT);Wheelchair cushion (measurements PT);BSC/3in1;Hospital bed  Recommendations for Other Services       Functional Status Assessment Patient has had a recent decline in their functional status and demonstrates the ability to make significant improvements in function in a reasonable and predictable amount of time.     Precautions / Restrictions Precautions Precautions: Fall Required Braces or Orthoses: Splint/Cast Splint/Cast: surgical dressing in place Splint/Cast - Date Prophylactic Dressing Applied (if applicable): 78/93/81 Restrictions Weight Bearing Restrictions: Yes LLE Weight Bearing: Non weight bearing      Mobility  Bed Mobility Overal bed mobility: Modified Independent             General bed mobility comments: +rail, HOB elevated    Transfers Overall transfer level: Needs assistance Equipment used: Rolling walker (2 wheels) Transfers: Sit to/from Stand Sit to Stand: From elevated surface, Min assist           General transfer comment: cues for hand placement and sequencing, assist to power up    Ambulation/Gait Ambulation/Gait assistance: Min assist Gait Distance (Feet): 12 Feet (x 2) Assistive device: Rolling walker (2 wheels) Gait Pattern/deviations: Step-to pattern Gait velocity: decreased Gait velocity interpretation: <1.8 ft/sec, indicate of risk for recurrent falls   General Gait Details: hop-to gait. Assist to maintain balance. Good ability to maintain NWB LLE. Fatigues quickly.  Stairs            Wheelchair Mobility    Modified Rankin (Stroke Patients Only)       Balance Overall balance assessment: Needs assistance Sitting-balance support: No upper extremity supported, Feet supported Sitting balance-Leahy Scale: Good     Standing balance support:  Bilateral upper extremity supported, During functional activity, Reliant on assistive device for  balance Standing balance-Leahy Scale: Poor Standing balance comment: due to LLE NWB                             Pertinent Vitals/Pain Pain Assessment Pain Assessment: Faces Faces Pain Scale: Hurts a little bit Pain Location: L ankle Pain Descriptors / Indicators: Discomfort Pain Intervention(s): Monitored during session, Repositioned, Premedicated before session    Home Living Family/patient expects to be discharged to:: Private residence Living Arrangements: Spouse/significant other Available Help at Discharge: Family;Available PRN/intermittently Type of Home: House Home Access: Stairs to enter Entrance Stairs-Rails: Psychiatric nurse of Steps: 3 Alternate Level Stairs-Number of Steps: flight Home Layout: Two level;Bed/bath upstairs;1/2 bath on main level Home Equipment: Conservation officer, nature (2 wheels)      Prior Function Prior Level of Function : Independent/Modified Independent                     Hand Dominance        Extremity/Trunk Assessment   Upper Extremity Assessment Upper Extremity Assessment: Overall WFL for tasks assessed    Lower Extremity Assessment Lower Extremity Assessment: LLE deficits/detail LLE Deficits / Details: L ankle fx s/p ORIF    Cervical / Trunk Assessment Cervical / Trunk Assessment: Normal  Communication   Communication: No difficulties  Cognition Arousal/Alertness: Awake/alert Behavior During Therapy: WFL for tasks assessed/performed Overall Cognitive Status: Within Functional Limits for tasks assessed                                          General Comments General comments (skin integrity, edema, etc.): VSS on RA    Exercises     Assessment/Plan    PT Assessment Patient needs continued PT services  PT Problem List Decreased mobility;Decreased knowledge of precautions;Decreased activity tolerance;Pain;Decreased balance;Decreased knowledge of use of DME       PT Treatment  Interventions DME instruction;Therapeutic activities;Gait training;Therapeutic exercise;Patient/family education;Balance training;Functional mobility training    PT Goals (Current goals can be found in the Care Plan section)  Acute Rehab PT Goals Patient Stated Goal: rehab then home PT Goal Formulation: With patient Time For Goal Achievement: 02/18/22 Potential to Achieve Goals: Good    Frequency Min 3X/week     Co-evaluation               AM-PAC PT "6 Clicks" Mobility  Outcome Measure Help needed turning from your back to your side while in a flat bed without using bedrails?: None Help needed moving from lying on your back to sitting on the side of a flat bed without using bedrails?: A Little Help needed moving to and from a bed to a chair (including a wheelchair)?: A Little Help needed standing up from a chair using your arms (e.g., wheelchair or bedside chair)?: A Little Help needed to walk in hospital room?: A Little Help needed climbing 3-5 steps with a railing? : Total 6 Click Score: 17    End of Session Equipment Utilized During Treatment: Gait belt Activity Tolerance: Patient tolerated treatment well Patient left: in chair;with call bell/phone within reach;with chair alarm set Nurse Communication: Mobility status PT Visit Diagnosis: Unsteadiness on feet (R26.81);Difficulty in walking, not elsewhere classified (R26.2)    Time: 3710-6269 PT Time Calculation (min) (ACUTE ONLY): 25 min  Charges:   PT Evaluation $PT Eval Low Complexity: 1 Low PT Treatments $Gait Training: 8-22 mins        Lorrin Goodell, [place]  Office # [phone] Pager [phone]   Lorriane Shire 02/04/2022, 11:38 AM

## 2022-02-04 NOTE — Progress Notes (Addendum)
Subjective: 1 Day Post-Op Procedure(s) (LRB): OPEN REDUCTION INTERNAL FIXATION (ORIF) ANKLE FRACTURE TRIMALLEOLAR FRACTURE (Left)  Patient reports pain as mild to moderate.  Tolerating POs well.  Admits to flatus. Denies fever, chills, N/V, CP, SOB.  Objective:   VITALS:  Temp:  [97.6 F (36.4 C)-98.5 F (36.9 C)] 98.3 F (36.8 C) (07/28 0400) Pulse Rate:  [70-90] 70 (07/28 0400) Resp:  [12-20] 13 (07/28 0400) BP: (120-141)/(53-124) 131/70 (07/28 0400) SpO2:  [93 %-98 %] 96 % (07/28 0400) Weight:  [87 kg-87.1 kg] 87 kg (07/27 1958)  General: WDWN patient in NAD. Psych:  Appropriate mood and affect. Neuro:  A&O x 3, Moving all extremities, sensation intact to light touch HEENT:  EOMs intact Chest:  Even non-labored respirations Skin:  SLS C/D/I, no rashes or lesions Extremities: warm/dry, no visible edema, erythema or echymosis.  No lymphadenopathy. Pulses: Popliteus 2+ MSK:  ROM: EHL/FHL intact, MMT: able to perform quad set   LABS Recent Labs    02/03/22 1245  HGB 14.4  WBC 6.6  PLT 198   Recent Labs    02/03/22 1245  NA 139  K 5.1  CL 110  CO2 17*  BUN 32*  CREATININE 1.90*  GLUCOSE 154*   No results for input(s): "LABPT", "INR" in the last 72 hours.   Assessment/Plan: 1 Day Post-Op Procedure(s) (LRB): OPEN REDUCTION INTERNAL FIXATION (ORIF) ANKLE FRACTURE TRIMALLEOLAR FRACTURE (Left)  NWB L LE Up with therapy Appreciate Medicine team's assistance. DVT ppx:  Resume plavix Disp: likely SNF D/C scripts on chart. Plan for 2 week outpatient post-op visit.  Mechele Claude PA-C [facility] Office:  [phone]   Addendum:  After searching [place] PMP Aware the patient is provided a Rx for oxycodone.

## 2022-02-04 NOTE — Evaluation (Signed)
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 He subsequently developed left ankle pain/swelling but did not have xray until 6 days after the fall. Xray revealed displaced L ankle fx. He underwent ORIF 7/27. 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 Discussed patient current functional performance during transfers and ADL tasks while discussing options for rehab when discharged. Pt currently deciding between d/c to SNF while paying out of pocket or d/c to Daughter's home temporarily while waiting for MD to ok weightbearing on LLE. Due to demand on BUE while navigating his environment with RW while maintain NWB status on LLE, pt presents with decreased activity tolerance and endurance. At this time, patient requires slight physical assist to complete BADL tasks and functional transfers. If patient needs to be able to navigate his 2 level home, discharge to SNF is recommended. If patient is able to stay with daughter temporarily in her 1 level home, [facility] OT is recommended. OT will follow patient acutely.      Recommendations for follow up therapy are one component of a multi-disciplinary discharge planning process, led by the attending physician.  Recommendations may be updated based on patient status, additional functional criteria and insurance authorization.   Follow Up Recommendations  Skilled nursing-short term rehab (<3 hours/day) (SNF recommended unless pt discharges to daughter's 1 level home where [facility] is recommended for ADL tasks.)    Assistance Recommended at Discharge PRN  Patient can return home with the following A little help with walking and/or  transfers;A little help with bathing/dressing/bathroom;Assistance with cooking/housework;Assist for transportation;Help with stairs or ramp for entrance    Functional Status Assessment  Patient has had a recent decline in their functional status and demonstrates the ability to make significant improvements in function in a reasonable and predictable amount of time.  Equipment Recommendations  Tub/shower seat;Other (comment) (Discussed benefit of shower grab bar and options for placement (inside vs. outside). Provided example of shower seated with and w/o back))       Precautions / Restrictions Precautions Precautions: Fall Required Braces or Orthoses: Splint/Cast Splint/Cast: surgical dressing in place Splint/Cast - Date Prophylactic Dressing Applied (if applicable): 35/36/14 Restrictions Weight Bearing Restrictions: Yes LLE Weight Bearing: Non weight bearing      Mobility Bed Mobility Overal bed mobility:  (See PT eval for bed mobility. Pt up in recliner during OT evaluation)     Transfers Overall transfer level:  (See PT eval for functional performance during transfers. pt up in recliner during OT evaluation)              ADL either performed or assessed with clinical judgement   ADL Overall ADL's : Needs assistance/impaired           General ADL Comments: Due to recent ankle surgery, patient requires Min Assist for functional transfers using RW and maintaining WB status; Min A for LB bathing/dressing, and Set-up assist for UB bathing/dressing.     Vision Baseline Vision/History: 1 Wears glasses Ability to See in Adequate Light: 0 Adequate Patient Visual Report: No change from baseline              Pertinent Vitals/Pain Pain Assessment Pain  Assessment: 0-10 Pain Score: 1  Pain Location: L ankle Pain Descriptors / Indicators: Discomfort Pain Intervention(s): Premedicated before session, Monitored during session     Hand Dominance Right    Extremity/Trunk Assessment Upper Extremity Assessment Upper Extremity Assessment: Overall WFL for tasks assessed;Generalized weakness (Overall WFL for basic ADL tasks although decreased endurance and activity tolerance demonstrated when using RW to navigate his environment while hopping on right leg.)   Lower Extremity Assessment Lower Extremity Assessment: Defer to PT evaluation LLE Deficits / Details: L ankle fx s/p ORIF   Cervical / Trunk Assessment Cervical / Trunk Assessment: Normal   Communication Communication Communication: No difficulties   Cognition Arousal/Alertness: Awake/alert Behavior During Therapy: WFL for tasks assessed/performed Overall Cognitive Status: Within Functional Limits for tasks assessed                  Home Living Family/patient expects to be discharged to:: Private residence Living Arrangements: Spouse/significant other Available Help at Discharge: Family;Available PRN/intermittently Type of Home: House Home Access: Stairs to enter [employer] of Steps: 3 Entrance Stairs-Rails: Right;Left Home Layout: Two level;Bed/bath upstairs;1/2 bath on main level Alternate Level Stairs-Number of Steps: flight   Bathroom Shower/Tub: Occupational psychologist: Standard     Home Equipment: Conservation officer, nature (2 wheels)   Additional Comments: Pt reports that he has a Daughter who lives out of state that has offered to have him stay with while he is NWB. She has a 1 level home and walk-in shower.      Prior Functioning/Environment Prior Level of Function : Independent/Modified Independent            OT Problem List: Decreased strength;Decreased activity tolerance;Impaired balance (sitting and/or standing);Decreased knowledge of use of DME or AE;Decreased knowledge of precautions      OT Treatment/Interventions: Self-care/ADL training;Therapeutic exercise;Therapeutic activities;Neuromuscular education;Energy conservation;DME and/or  AE instruction;Patient/family education;Balance training    OT Goals(Current goals can be found in the care plan section) Acute Rehab OT Goals Patient Stated Goal: to get stronger OT Goal Formulation: With patient Time For Goal Achievement: 02/18/22 Potential to Achieve Goals: Good  OT Frequency: Min 2X/week       AM-PAC OT "6 Clicks" Daily Activity     Outcome Measure Help from another person eating meals?: None Help from another person taking care of personal grooming?: None Help from another person toileting, which includes using toliet, bedpan, or urinal?: A Little Help from another person bathing (including washing, rinsing, drying)?: A Little Help from another person to put on and taking off regular upper body clothing?: None Help from another person to put on and taking off regular lower body clothing?: A Little 6 Click Score: 21   End of Session    Activity Tolerance: Patient tolerated treatment well Patient left: in chair;with call bell/phone within reach  OT Visit Diagnosis: Muscle weakness (generalized) (M62.81);History of falling (Z91.81)                Time: 1345-1415 OT Time Calculation (min): 30 min Charges:  OT General Charges $OT Visit: 1 Visit OT Evaluation $OT Eval Low Complexity: 1 Low OT Treatments $Self Care/Home Management : 23-37 mins ([place], OTR/L,CBIS  Supplemental OT - [facility] and [facility]   Priti Consoli, Clarene Duke 02/04/2022, 2:32 PM

## 2022-02-04 NOTE — Plan of Care (Signed)

## 2022-02-04 NOTE — Plan of Care (Signed)
  Problem: Education: Goal: Ability to describe self-care measures that may prevent or decrease complications (Diabetes Survival Skills Education) will improve Outcome: Progressing   

## 2022-02-05 DIAGNOSIS — N1832 Chronic kidney disease, stage 3b: Secondary | ICD-10-CM | POA: Diagnosis not present

## 2022-02-05 DIAGNOSIS — I129 Hypertensive chronic kidney disease with stage 1 through stage 4 chronic kidney disease, or unspecified chronic kidney disease: Secondary | ICD-10-CM | POA: Diagnosis not present

## 2022-02-05 DIAGNOSIS — E1122 Type 2 diabetes mellitus with diabetic chronic kidney disease: Secondary | ICD-10-CM | POA: Diagnosis not present

## 2022-02-05 DIAGNOSIS — S82852A Displaced trimalleolar fracture of left lower leg, initial encounter for closed fracture: Secondary | ICD-10-CM | POA: Diagnosis not present

## 2022-02-05 DIAGNOSIS — E088 Diabetes mellitus due to underlying condition with unspecified complications: Secondary | ICD-10-CM | POA: Diagnosis not present

## 2022-02-05 DIAGNOSIS — N189 Chronic kidney disease, unspecified: Secondary | ICD-10-CM | POA: Diagnosis not present

## 2022-02-05 DIAGNOSIS — Z951 Presence of aortocoronary bypass graft: Secondary | ICD-10-CM | POA: Diagnosis not present

## 2022-02-05 DIAGNOSIS — I2510 Atherosclerotic heart disease of native coronary artery without angina pectoris: Secondary | ICD-10-CM | POA: Diagnosis not present

## 2022-02-05 DIAGNOSIS — I10 Essential (primary) hypertension: Secondary | ICD-10-CM | POA: Diagnosis not present

## 2022-02-05 LAB — GLUCOSE, CAPILLARY
Glucose-Capillary: 179 mg/dL — ABNORMAL HIGH (ref 70–99)
Glucose-Capillary: 140 mg/dL — ABNORMAL HIGH (ref 70–99)
Glucose-Capillary: 196 mg/dL — ABNORMAL HIGH (ref 70–99)
Glucose-Capillary: 220 mg/dL — ABNORMAL HIGH (ref 70–99)

## 2022-02-05 NOTE — Progress Notes (Signed)
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 He subsequently developed left ankle pain/swelling but did not have xray until 6 days after the fall. Xray revealed displaced L ankle fx. He underwent ORIF 7/27. 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 Pt able to do these with only initial instruction. This goal has been met.   Recommendations for follow up therapy are one component of a multi-disciplinary discharge planning process, led by the attending physician.  Recommendations may be updated based on patient status, additional functional criteria and insurance authorization.    Follow Up Recommendations  Skilled nursing-short term rehab (<3 hours/day)    Assistance Recommended at Discharge PRN  Patient can return home with the following  A little help with walking and/or transfers;A little help with bathing/dressing/bathroom;Assistance with cooking/housework;Assist for transportation;Help with stairs or ramp for entrance   Equipment Recommendations   (pt to get on his own for tub equipment)       Precautions / Restrictions Precautions Precautions: Fall Required Braces or Orthoses: Splint/Cast Splint/Cast: surgical dressing in place Splint/Cast - Date Prophylactic Dressing Applied (if applicable): 18/56/31 Restrictions Weight Bearing Restrictions: Yes LLE Weight Bearing: Non weight bearing                Extremity/Trunk Assessment Upper Extremity Assessment Upper Extremity Assessment: Generalized weakness            Vision Baseline Vision/History: 1 Wears glasses Patient Visual Report: No change from baseline             Cognition Arousal/Alertness: Awake/alert Behavior During Therapy: WFL for tasks assessed/performed Overall Cognitive Status: Within Functional Limits for tasks assessed                                          Exercises Other Exercises Other Exercises: Educated and pt perform while supine in bed shoulder exercises for horizontal adduction/abdution, reaching up for ceiling, and reaching down towards feet. Encouraged him to continue to do these a minimum 5 times a day 5 reps each holding each for 3 seconds.            Pertinent Vitals/ Pain       Pain Assessment Pain Assessment: 0-10 Faces Pain Scale: Hurts a little bit Pain Location: L ankle Pain Descriptors / Indicators: Discomfort Pain Intervention(s): Limited activity within patient's tolerance, Monitored during session         Frequency  Min 2X/week        Progress Toward Goals  OT Goals(current goals can now be found in the care plan section)  Progress towards OT goals: Progressing toward goals  Acute Rehab OT Goals Patient Stated Goal: to only go to rehab for short stay OT Goal Formulation: With patient Time For Goal Achievement: 02/18/22 Potential to Achieve Goals: Good  Plan Discharge plan remains appropriate       AM-PAC OT "6 Clicks" Daily Activity     Outcome Measure   Help from another person eating meals?: None Help from another person taking care of personal grooming?: A Little  Help from another person toileting, which includes using toliet, bedpan, or urinal?: A Little Help from another person bathing (including washing, rinsing, drying)?: A Little Help from another person to put on and taking off regular upper body clothing?: A Little Help from another person to put on and taking off regular lower body clothing?: A Little 6 Click Score: 19    End of Session Equipment Utilized During Treatment: Gait belt (knee scooter)  OT Visit Diagnosis: Muscle weakness (generalized)  (M62.81);History of falling (Z91.81);Other abnormalities of gait and mobility (R26.89);Unsteadiness on feet (R26.81)   Activity Tolerance Patient tolerated treatment well   Patient Left in bed;with call bell/phone within reach           Time: 1130-1150 OT Time Calculation (min): 20 min  Charges: OT General Charges $OT Visit: 1 Visit OT Treatments $Self Care/Home Management : 38-52 mins $Therapeutic Exercise: 8-22 mins  [name], OTR/L [facility] [phone] Office [phone]    [name] 02/05/2022, 2:59 PM

## 2022-02-05 NOTE — Progress Notes (Signed)
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 He subsequently developed left ankle pain/swelling but did not have xray until 6 days after the fall. Xray revealed displaced L ankle fx. He underwent ORIF 7/27. 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 Pt is able to do basic ADLs without the need for AE and min guard to min A for standing balance with NWB'ing LLE. He was also min guard to min A with use of knee scooter (needing more of the min A as he fatigued). He will continue to benefit from acute OT with follow up at SNF.   Recommendations for follow up therapy are one component of a multi-disciplinary discharge planning process, led by the attending physician.  Recommendations may be updated based on patient status, additional functional criteria and insurance authorization.    Follow Up Recommendations  Skilled nursing-short term rehab (<3 hours/day)    Assistance Recommended at Discharge PRN  Patient can return home with the following  A little help with walking and/or transfers;A little help with bathing/dressing/bathroom;Assistance with cooking/housework;Assist for transportation;Help with stairs or ramp for entrance   Equipment Recommendations   (pt to get on his own for tub equipment)       Precautions / Restrictions Precautions Precautions: Fall Required Braces or Orthoses: Splint/Cast Splint/Cast - Date Prophylactic Dressing Applied (if applicable): 78/93/81 Restrictions Weight Bearing Restrictions: Yes LLE Weight Bearing: Non weight bearing       Mobility Bed Mobility Overal bed mobility: Modified Independent             General bed mobility  comments: +rail, HOB elevated    Transfers Overall transfer level: Needs assistance Equipment used: None (knee walker) Transfers: Sit to/from Stand Sit to Stand: Min assist           General transfer comment: Cues for safe hand placement on and off knee scooter. Pt mobilized with knee scooter ~100 feet at min guard-min A level (more as he fatigued)     Balance Overall balance assessment: Needs assistance Sitting-balance support: No upper extremity supported, Feet supported Sitting balance-Leahy Scale: Good     Standing balance support: Bilateral upper extremity supported Standing balance-Leahy Scale: Poor                             ADL either performed or assessed with clinical judgement   ADL Overall ADL's : Needs assistance/impaired                     Lower Body Dressing: Min guard;Sit to/from stand Lower Body Dressing Details (indicate cue type and reason): pt can cross his legs over each other alternately to get to his feet to donn sock/shoe and underwear Toilet Transfer: Stand-pivot;Minimal assistance Toilet Transfer Details (indicate cue type and reason): simulated bed<>on/off knee scooter           General ADL Comments: Gave pt handout on tub equipment    Extremity/Trunk Assessment Upper Extremity Assessment Upper Extremity Assessment: Generalized weakness            Vision Baseline Vision/History: 1 Wears glasses Patient Visual Report:  No change from baseline            Cognition Arousal/Alertness: Awake/alert Behavior During Therapy: WFL for tasks assessed/performed Overall Cognitive Status: Within Functional Limits for tasks assessed                                                     Pertinent Vitals/ Pain       Pain Assessment Pain Assessment: 0-10 Faces Pain Scale: Hurts a little bit Pain Location: L ankle Pain Descriptors / Indicators: Discomfort Pain Intervention(s): Limited activity within  patient's tolerance, Monitored during session, Repositioned, Premedicated before session         Frequency  Min 2X/week        Progress Toward Goals  OT Goals(current goals can now be found in the care plan section)  Progress towards OT goals: Progressing toward goals  Acute Rehab OT Goals Patient Stated Goal: to only to go rehab for short period OT Goal Formulation: With patient Time For Goal Achievement: 02/18/22 Potential to Achieve Goals: Good  Plan Discharge plan remains appropriate       AM-PAC OT "6 Clicks" Daily Activity     Outcome Measure   Help from another person eating meals?: None Help from another person taking care of personal grooming?: A Little Help from another person toileting, which includes using toliet, bedpan, or urinal?: A Little Help from another person bathing (including washing, rinsing, drying)?: A Little Help from another person to put on and taking off regular upper body clothing?: A Little Help from another person to put on and taking off regular lower body clothing?: A Little 6 Click Score: 19    End of Session Equipment Utilized During Treatment: Gait belt (knee scooter)  OT Visit Diagnosis: Muscle weakness (generalized) (M62.81);History of falling (Z91.81);Other abnormalities of gait and mobility (R26.89);Unsteadiness on feet (R26.81)   Activity Tolerance Patient tolerated treatment well   Patient Left in bed;with call bell/phone within reach           Time: 1011-1055 OT Time Calculation (min): 44 min  Charges: OT General Charges $OT Visit: 1 Visit OT Treatments $Self Care/Home Management : 38-52 mins  [name], OTR/L Acute Rehab Services [facility] [phone] Office [phone]    [name] 02/05/2022, 1:32 PM

## 2022-02-05 NOTE — Plan of Care (Signed)

## 2022-02-05 NOTE — Progress Notes (Signed)
Subjective: 2 Days Post-Op Procedure(s) (LRB): OPEN REDUCTION INTERNAL FIXATION (ORIF) ANKLE FRACTURE TRIMALLEOLAR FRACTURE (Left)  Patient reports pain as mild. He reports this is well controlled with minimal issues. Denies fever, chills, N/V, CP, SOB.  Objective:   VITALS:  Temp:  [97.6 F (36.4 C)-98.6 F (37 C)] 97.6 F (36.4 C) (07/29 0808) Pulse Rate:  [65-69] 69 (07/29 0808) Resp:  [12-16] 16 (07/29 0808) BP: (127-128)/(56-62) 127/56 (07/29 0808) SpO2:  [97 %-99 %] 97 % (07/29 0808)  Gen: AAOx3, NAD  Left lower extremity: Well padded short leg splint in place Wiggles toes SILT over toes CR<2s    LABS Recent Labs    02/03/22 1245 02/04/22 0735  HGB 14.4 13.3  WBC 6.6 8.2  PLT 198 191   Recent Labs    02/03/22 1245 02/04/22 0735  NA 139 138  K 5.1 4.7  CL 110 110  CO2 17* 23  BUN 32* 26*  CREATININE 1.90* 1.73*  GLUCOSE 154* 150*   No results for input(s): "LABPT", "INR" in the last 72 hours.   Assessment/Plan: 2 Days Post-Op Procedure(s) (LRB): OPEN REDUCTION INTERNAL FIXATION (ORIF) ANKLE FRACTURE TRIMALLEOLAR FRACTURE (Left)  NWB L LE Up with therapy Appreciate Medicine team's assistance. DVT ppx:  Resume plavix Disp: likely SNF D/C scripts on chart. Plan for 2 week outpatient post-op visit.  Armond Hang 02/05/2022, 8:29 AM

## 2022-02-05 NOTE — Consult Note (Signed)
PROGRESS NOTE        PATIENT DETAILS Name: Glenn Roach Age: 76 y.o. Sex: male Date of Birth: [birth year] Admit Date: 02/03/2022 Admitting Physician Wylene Simmer, MD GUY:QIHK, Elyse Jarvis, MD  Brief Summary: Patient is a 76 y.o.  male with past medical history of DM-2, HTN, HLD, CAD s/p CABG 2012-who sustained a mechanical fall-and was found to have a left ankle fracture, he underwent ORIF RIF on 7/27-TRH consulted for medical management.  Subjective: Pain in LLE is controlled with narcotics.  Objective: Vitals: Blood pressure (!) 127/56, pulse 69, temperature 97.6 F (36.4 C), temperature source Oral, resp. rate 16, height '5\' 9"'$  (1.753 m), weight 87 kg, SpO2 97 %.   Exam: Gen Exam:Alert awake-not in any distress HEENT:atraumatic, normocephalic Extremities:no edema Neurology: Non focal Skin: no rash   Pertinent Labs/Radiology:    Latest Ref Rng & Units 02/04/2022    7:35 AM 02/03/2022   12:45 PM 06/15/2021   10:44 AM  CBC  WBC 4.0 - 10.5 K/uL 8.2  6.6  4.8   Hemoglobin 13.0 - 17.0 g/dL 13.3  14.4  14.8   Hematocrit 39.0 - 52.0 % 40.4  45.4  43.9   Platelets 150 - 400 K/uL 191  198  175     Lab Results  Component Value Date   NA 138 02/04/2022   K 4.7 02/04/2022   CL 110 02/04/2022   CO2 23 02/04/2022      Assessment/Plan: Displaced trimalleolar fracture of left ankle: S/p ORIF-defer to primary service.  Apparently scheduled to go to SNF tomorrow.   DM-2: CBG stable with SSI.  Resume Amaryl/Trulicity on discharge.  Recent Labs    02/04/22 1745 02/04/22 2127 02/05/22 0812  GLUCAP 190* 197* 179*       HTN: Continue nitrates/metoprolol-follow/optimize.     HLD: Continue Zetia/fenofibrate/statin.   CAD s/p CABG 2012: No overt anginal symptoms-seems to have tolerated ORIF pretty well.  Not on aspirin at home.  Continue Plavix/statin/beta-blocker and Ranexa.  Follows with Dr. Vladimir Creeks cardiology in [place].   CKD stage IIIb:  Creatinine close to baseline-follow.   GERD: Continue PPI   BPH: Continue Flomax   TRH will continue to follow the patient.  Remains medically stable for discharge whenever felt appropriate by primary service.  BMI: Estimated body mass index is 28.32 kg/m as calculated from the following:   Height as of this encounter: '5\' 9"'$  (1.753 m).   Weight as of this encounter: 87 kg.   Code status:   Code Status: Full Code   DVT Prophylaxis: SCDs Start: 02/03/22 1834    Diet: Diet Order             Diet Carb Modified Fluid consistency: Thin; Room service appropriate? Yes  Diet effective now                     Antimicrobial agents: Anti-infectives (From admission, onward)    Start     Dose/Rate Route Frequency Ordered Stop   02/03/22 1515  vancomycin (VANCOCIN) powder  Status:  Discontinued          As needed 02/03/22 1515 02/03/22 1521        MEDICATIONS: Scheduled Meds:  clopidogrel  75 mg Oral Daily   docusate sodium  100 mg Oral BID   ezetimibe  10 mg Oral Daily  fenofibrate  160 mg Oral Daily   insulin aspart  0-15 Units Subcutaneous TID WC   isosorbide mononitrate  30 mg Oral Daily   metoprolol tartrate  12.5 mg Oral BID   multivitamin with minerals  1 tablet Oral Daily   pantoprazole  40 mg Oral Daily   pravastatin  40 mg Oral q1800   ranolazine  500 mg Oral BID   tamsulosin  0.4 mg Oral Daily   Continuous Infusions:  dextrose 5 % and 0.45 % NaCl with KCl 20 mEq/L 50 mL/hr at 02/04/22 1817   PRN Meds:.acetaminophen, bisacodyl, hydrALAZINE, HYDROcodone-acetaminophen, HYDROcodone-acetaminophen, melatonin, metoCLOPramide **OR** metoCLOPramide (REGLAN) injection, morphine injection, ondansetron **OR** ondansetron (ZOFRAN) IV, polyethylene glycol, sodium phosphate   I have personally reviewed following labs and imaging studies  LABORATORY DATA: CBC: Recent Labs  Lab 02/03/22 1245 02/04/22 0735  WBC 6.6 8.2  HGB 14.4 13.3  HCT 45.4 40.4  MCV 95.2  92.7  PLT 198 191     Basic Metabolic Panel: Recent Labs  Lab 02/03/22 1245 02/04/22 0735  NA 139 138  K 5.1 4.7  CL 110 110  CO2 17* 23  GLUCOSE 154* 150*  BUN 32* 26*  CREATININE 1.90* 1.73*  CALCIUM 9.3 9.1     GFR: Estimated Creatinine Clearance: 39 mL/min (A) (by C-G formula based on SCr of 1.73 mg/dL (H)).  Liver Function Tests: No results for input(s): "AST", "ALT", "ALKPHOS", "BILITOT", "PROT", "ALBUMIN" in the last 168 hours. No results for input(s): "LIPASE", "AMYLASE" in the last 168 hours. No results for input(s): "AMMONIA" in the last 168 hours.  Coagulation Profile: No results for input(s): "INR", "PROTIME" in the last 168 hours.  Cardiac Enzymes: No results for input(s): "CKTOTAL", "CKMB", "CKMBINDEX", "TROPONINI" in the last 168 hours.  BNP (last 3 results) No results for input(s): "PROBNP" in the last 8760 hours.  Lipid Profile: No results for input(s): "CHOL", "HDL", "LDLCALC", "TRIG", "CHOLHDL", "LDLDIRECT" in the last 72 hours.  Thyroid Function Tests: No results for input(s): "TSH", "T4TOTAL", "FREET4", "T3FREE", "THYROIDAB" in the last 72 hours.  Anemia Panel: No results for input(s): "VITAMINB12", "FOLATE", "FERRITIN", "TIBC", "IRON", "RETICCTPCT" in the last 72 hours.  Urine analysis:    Component Value Date/Time   COLORURINE YELLOW 09/09/2010 0007   APPEARANCEUR CLEAR 09/09/2010 0007   LABSPEC 1.018 09/09/2010 0007   PHURINE 6.0 09/09/2010 0007   HGBUR NEGATIVE 09/09/2010 0007   BILIRUBINUR NEGATIVE 09/09/2010 0007   KETONESUR NEGATIVE 09/09/2010 0007   PROTEINUR NEGATIVE 09/09/2010 0007   UROBILINOGEN 1.0 09/09/2010 0007   NITRITE NEGATIVE 09/09/2010 0007   LEUKOCYTESUR  09/09/2010 0007    NEGATIVE MICROSCOPIC NOT DONE ON URINES WITH NEGATIVE PROTEIN, BLOOD, LEUKOCYTES, NITRITE, OR GLUCOSE <1000 mg/dL.    Sepsis Labs: Lactic Acid, Venous No results found for: "LATICACIDVEN"  MICROBIOLOGY: Recent Results (from the past 240  hour(s))  Surgical pcr screen     Status: None   Collection Time: 02/03/22 12:39 PM   Specimen: Nasal Mucosa; Nasal Swab  Result Value Ref Range Status   MRSA, PCR NEGATIVE NEGATIVE Final   Staphylococcus aureus NEGATIVE NEGATIVE Final    Comment: (NOTE) The Xpert SA Assay (FDA approved for NASAL specimens in patients 44 years of age and older), is one component of a comprehensive surveillance program. It is not intended to diagnose infection nor to guide or monitor treatment. Performed at [facility], [place] [address], [place], [place] [address]     RADIOLOGY STUDIES/RESULTS: DG MINI C-ARM IMAGE  ONLY  Result Date: 02/03/2022 There is no interpretation for this exam.  This order is for images obtained during a surgical procedure.  Please See "Surgeries" Tab for more information regarding the procedure.     LOS: 0 days   Oren Binet, MD  [facility]    To contact the attending provider between 7A-7P or the covering provider during after hours 7P-7A, please log into the web site [email or web address] and access using universal [place] password for that web site. If you do not have the password, please call the hospital operator.  02/05/2022, 11:24 AM

## 2022-02-05 NOTE — Plan of Care (Signed)
  Problem: Education: Goal: Ability to describe self-care measures that may prevent or decrease complications (Diabetes Survival Skills Education) will improve Outcome: Not Progressing Goal: Individualized Educational Video(s) Outcome: Not Progressing   Problem: Coping: Goal: Ability to adjust to condition or change in health will improve Outcome: Not Progressing   Problem: Fluid Volume: Goal: Ability to maintain a balanced intake and output will improve Outcome: Not Progressing   Problem: Health Behavior/Discharge Planning: Goal: Ability to identify and utilize available resources and services will improve Outcome: Not Progressing Goal: Ability to manage health-related needs will improve Outcome: Not Progressing   Problem: Metabolic: Goal: Ability to maintain appropriate glucose levels will improve Outcome: Not Progressing   

## 2022-02-06 DIAGNOSIS — I25709 Atherosclerosis of coronary artery bypass graft(s), unspecified, with unspecified angina pectoris: Secondary | ICD-10-CM | POA: Diagnosis not present

## 2022-02-06 DIAGNOSIS — M6281 Muscle weakness (generalized): Secondary | ICD-10-CM | POA: Diagnosis not present

## 2022-02-06 DIAGNOSIS — Z9181 History of falling: Secondary | ICD-10-CM | POA: Diagnosis not present

## 2022-02-06 DIAGNOSIS — S82852D Displaced trimalleolar fracture of left lower leg, subsequent encounter for closed fracture with routine healing: Secondary | ICD-10-CM | POA: Diagnosis not present

## 2022-02-06 DIAGNOSIS — Z87442 Personal history of urinary calculi: Secondary | ICD-10-CM | POA: Diagnosis not present

## 2022-02-06 DIAGNOSIS — E1122 Type 2 diabetes mellitus with diabetic chronic kidney disease: Secondary | ICD-10-CM | POA: Diagnosis not present

## 2022-02-06 DIAGNOSIS — R2681 Unsteadiness on feet: Secondary | ICD-10-CM | POA: Diagnosis not present

## 2022-02-06 DIAGNOSIS — Z794 Long term (current) use of insulin: Secondary | ICD-10-CM | POA: Diagnosis not present

## 2022-02-06 DIAGNOSIS — N400 Enlarged prostate without lower urinary tract symptoms: Secondary | ICD-10-CM | POA: Diagnosis not present

## 2022-02-06 DIAGNOSIS — Z951 Presence of aortocoronary bypass graft: Secondary | ICD-10-CM | POA: Diagnosis not present

## 2022-02-06 DIAGNOSIS — G4733 Obstructive sleep apnea (adult) (pediatric): Secondary | ICD-10-CM | POA: Diagnosis not present

## 2022-02-06 DIAGNOSIS — N183 Chronic kidney disease, stage 3 unspecified: Secondary | ICD-10-CM

## 2022-02-06 DIAGNOSIS — K219 Gastro-esophageal reflux disease without esophagitis: Secondary | ICD-10-CM | POA: Diagnosis not present

## 2022-02-06 DIAGNOSIS — I10 Essential (primary) hypertension: Secondary | ICD-10-CM | POA: Diagnosis not present

## 2022-02-06 DIAGNOSIS — T8619 Other complication of kidney transplant: Secondary | ICD-10-CM | POA: Diagnosis not present

## 2022-02-06 DIAGNOSIS — R279 Unspecified lack of coordination: Secondary | ICD-10-CM | POA: Diagnosis not present

## 2022-02-06 DIAGNOSIS — G4700 Insomnia, unspecified: Secondary | ICD-10-CM | POA: Diagnosis not present

## 2022-02-06 DIAGNOSIS — I2510 Atherosclerotic heart disease of native coronary artery without angina pectoris: Secondary | ICD-10-CM | POA: Diagnosis not present

## 2022-02-06 DIAGNOSIS — W19XXXD Unspecified fall, subsequent encounter: Secondary | ICD-10-CM | POA: Diagnosis not present

## 2022-02-06 DIAGNOSIS — E088 Diabetes mellitus due to underlying condition with unspecified complications: Secondary | ICD-10-CM | POA: Diagnosis not present

## 2022-02-06 DIAGNOSIS — Z4789 Encounter for other orthopedic aftercare: Secondary | ICD-10-CM | POA: Diagnosis not present

## 2022-02-06 DIAGNOSIS — E785 Hyperlipidemia, unspecified: Secondary | ICD-10-CM | POA: Diagnosis not present

## 2022-02-06 DIAGNOSIS — S82852A Displaced trimalleolar fracture of left lower leg, initial encounter for closed fracture: Secondary | ICD-10-CM | POA: Diagnosis not present

## 2022-02-06 DIAGNOSIS — E118 Type 2 diabetes mellitus with unspecified complications: Secondary | ICD-10-CM | POA: Diagnosis not present

## 2022-02-06 DIAGNOSIS — N200 Calculus of kidney: Secondary | ICD-10-CM | POA: Diagnosis not present

## 2022-02-06 DIAGNOSIS — Z4889 Encounter for other specified surgical aftercare: Secondary | ICD-10-CM | POA: Diagnosis not present

## 2022-02-06 DIAGNOSIS — R278 Other lack of coordination: Secondary | ICD-10-CM | POA: Diagnosis not present

## 2022-02-06 DIAGNOSIS — N189 Chronic kidney disease, unspecified: Secondary | ICD-10-CM | POA: Diagnosis not present

## 2022-02-06 DIAGNOSIS — I129 Hypertensive chronic kidney disease with stage 1 through stage 4 chronic kidney disease, or unspecified chronic kidney disease: Secondary | ICD-10-CM | POA: Diagnosis not present

## 2022-02-06 LAB — GLUCOSE, CAPILLARY: Glucose-Capillary: 237 mg/dL — ABNORMAL HIGH (ref 70–99)

## 2022-02-06 NOTE — Discharge Summary (Signed)
In most cases prophylactic antibiotics for Dental procdeures after total joint surgery are not necessary.  Exceptions are as follows:  1. History of prior total joint infection  2. Severely immunocompromised (Organ Transplant, cancer chemotherapy, Rheumatoid biologic meds such as [place])  3. Poorly controlled diabetes (A1C &gt; 8.0, blood glucose over 200)  If you have one of these conditions, contact your surgeon for an antibiotic prescription, prior to your dental procedure. Orthopedic Discharge Summary        Physician Discharge Summary  Patient ID: Glenn Roach MRN: [id number] DOB/AGE: 1945/07/29 76 y.o.  Admit date: 02/03/2022 Discharge date: 02/06/2022   Procedures:  Procedure(s) (LRB): OPEN REDUCTION INTERNAL FIXATION (ORIF) ANKLE FRACTURE TRIMALLEOLAR FRACTURE (Left)  Attending Physician:  Dr. Wylene Simmer  Admission Diagnoses:   left ankle fracture  Discharge Diagnoses:  left ankle fracture   Past Medical History:  Diagnosis Date   CAD (coronary artery disease)    Chest tightness    Coronary artery disease involving coronary bypass graft of native heart with angina pectoris ([place]) 10/28/2008   Qualifier: Diagnosis of  By: [name]     Diabetes mellitus due to underlying condition with unspecified complications ([place]) 34/74/2595   Qualifier: Diagnosis of  By: [name]     Dyslipidemia    Essential hypertension 04/14/2015   GERD (gastroesophageal reflux disease)    History of kidney stones    Mild renal insufficiency 04/14/2015   Mixed dyslipidemia 01/19/2017   NEPHROLITHIASIS, HX OF 10/28/2008   Qualifier: Diagnosis of  By: [name]     Non morbid obesity due to excess calories 04/14/2015   Sleep apnea     PCP: Mateo Flow, MD   Discharged Condition: good  Hospital Course:  Patient underwent the above stated procedure on 02/03/2022. Patient tolerated the procedure well and brought to the recovery room in good condition and  subsequently to the floor. Patient had an uncomplicated hospital course and was stable for discharge.   Disposition: Discharge disposition: 03-Skilled Nursing Facility      with follow up in 2 weeks    Follow-up Information     Wylene Simmer, MD Follow up.   Specialty: Orthopedic Surgery Contact information: [address] [place] [address] [phone]                 Dental Antibiotics:  In most cases prophylactic antibiotics for Dental procdeures after total joint surgery are not necessary.  Exceptions are as follows:  1. History of prior total joint infection  2. Severely immunocompromised (Organ Transplant, cancer chemotherapy, Rheumatoid biologic meds such as [place])  3. Poorly controlled diabetes (A1C &gt; 8.0, blood glucose over 200)  If you have one of these conditions, contact your surgeon for an antibiotic prescription, prior to your dental procedure.  Discharge Instructions     Call MD / Call 911   Complete by: As directed    If you experience chest pain or shortness of breath, CALL 911 and be transported to the hospital emergency room.  If you develope a fever above 101 F, pus (white drainage) or increased drainage or redness at the wound, or calf pain, call your surgeon's office.   Constipation Prevention   Complete by: As directed    Drink plenty of fluids.  Prune juice may be helpful.  You may use a stool softener, such as Colace (over the counter) 100 mg twice a day.  Use MiraLax (over the counter) for constipation as needed.  Diet - low sodium heart healthy   Complete by: As directed    Increase activity slowly as tolerated   Complete by: As directed    Post-operative opioid taper instructions:   Complete by: As directed    POST-OPERATIVE OPIOID TAPER INSTRUCTIONS: It is important to wean off of your opioid medication as soon as possible. If you do not need pain medication after your surgery it is ok to stop day  one. Opioids include: Codeine, Hydrocodone(Norco, Vicodin), Oxycodone(Percocet, oxycontin) and hydromorphone amongst others.  Long term and even short term use of opiods can cause: Increased pain response Dependence Constipation Depression Respiratory depression And more.  Withdrawal symptoms can include Flu like symptoms Nausea, vomiting And more Techniques to manage these symptoms Hydrate well Eat regular healthy meals Stay active Use relaxation techniques(deep breathing, meditating, yoga) Do Not substitute Alcohol to help with tapering If you have been on opioids for less than two weeks and do not have pain than it is ok to stop all together.  Plan to wean off of opioids This plan should start within one week post op of your joint replacement. Maintain the same interval or time between taking each dose and first decrease the dose.  Cut the total daily intake of opioids by one tablet each day Next start to increase the time between doses. The last dose that should be eliminated is the evening dose.          Allergies as of 02/06/2022       Reactions   Crestor [rosuvastatin] Other (See Comments)   Diarrhea   Morphine Nausea And Vomiting   Ramipril Cough   Ticlopidine Hcl     Neutropenia (low white blood count)        Medication List     TAKE these medications    CENTRUM SILVER PO Take 1 tablet by mouth daily.   clopidogrel 75 MG tablet Commonly known as: PLAVIX Take 1 tablet (75 mg total) by mouth daily.   Coenzyme Q-10 200 MG Caps Take 200 mg by mouth daily.   docusate sodium 100 MG capsule Commonly known as: Colace Take 1 capsule (100 mg total) by mouth 2 (two) times daily. While taking narcotic pain medicine.   ezetimibe 10 MG tablet Commonly known as: ZETIA Take 1 tablet (10 mg total) by mouth daily.   fenofibrate 160 MG tablet Take 1 tablet (160 mg total) by mouth daily.   Fish Oil Triple Strength 1400 MG Caps Take 1,400 mg by mouth  daily.   glimepiride 2 MG tablet Commonly known as: AMARYL Take 2 mg by mouth daily with breakfast.   isosorbide mononitrate 30 MG 24 hr tablet Commonly known as: IMDUR Take 1 tablet (30 mg total) by mouth daily.   Livalo 4 MG Tabs Generic drug: Pitavastatin Calcium Take 1 tablet (4 mg total) by mouth daily.   Melatonin 10 MG Tbcr Take 10 mg by mouth at bedtime as needed (sleep).   metoprolol tartrate 25 MG tablet Commonly known as: LOPRESSOR Take 0.5 tablets (12.5 mg total) by mouth 2 (two) times daily.   nitroGLYCERIN 0.4 MG SL tablet Commonly known as: NITROSTAT Place 1 tablet (0.4 mg total) under the tongue every 5 (five) minutes as needed for chest pain.   oxyCODONE 5 MG immediate release tablet Commonly known as: Roxicodone Take 1 tablet (5 mg total) by mouth every 4 (four) hours as needed for up to 5 days for severe pain.   pantoprazole 40 MG tablet Commonly  known as: PROTONIX Take 40 mg by mouth daily.   ranolazine 500 MG 12 hr tablet Commonly known as: RANEXA Take 1 tablet (500 mg total) by mouth 2 (two) times daily.   senna 8.6 MG Tabs tablet Commonly known as: SENOKOT Take 2 tablets (17.2 mg total) by mouth 2 (two) times daily.   tamsulosin 0.4 MG Caps capsule Commonly known as: FLOMAX Take 0.4 mg by mouth daily.   Trulicity 1.5 PH/1.5AV Sopn Generic drug: Dulaglutide Inject 1.5 mg into the skin every Sunday.   zolpidem 5 MG tablet Commonly known as: AMBIEN Take 5 mg by mouth at bedtime as needed for sleep.          Signed: Ventura Bruns 02/06/2022, 8:42 AM  [facility] is now [employer] [address] [place], [place], [place] [address] Phone: [place]

## 2022-02-06 NOTE — TOC Transition Note (Signed)
Transition of Care [facility]) - CM/SW Discharge Note   Patient Details  Name: Glenn Roach MRN: [id number] Date of Birth: 02-Jul-1946  [facility]) CM/SW Contact:  Ludwig Clarks, LCSW Phone Number: 02/06/2022, 11:10 AM   Clinical Narrative:     Confirmed plans for dc to SNF today at [facility]. Son to transport. Both pt and son ok to dc without hardscript to the Ambien and will ask for SNF Provider to order; pt reports only takes this occasionally.  SNF agreeable to plans for transfer today- RN to call SNF RN to give report at dc.   Final next level of care: Skilled Nursing Facility Barriers to Discharge: No Barriers Identified   Patient Goals and CMS Choice Patient states their goals for this hospitalization and ongoing recovery are:: get back to full activity   Choice offered to / list presented to : Patient  Discharge Placement              Patient chooses bed at: [facility] [place] Patient to be transferred to facility by: son, Cosme Jacob Name of family member notified: Pt's wife and son Patient and family notified of of transfer: 02/06/22  Discharge Plan and Services In-house Referral: Clinical Social Work   Post Acute Care Choice: Advance                               Social Determinants of Health (SDOH) Interventions     Readmission Risk Interventions     No data to display

## 2022-02-06 NOTE — Progress Notes (Signed)
Report given to April, staff nurse at [facility]. All questions and concerns were fully addressed. Discharge summary packet provided to Pt's son. Pt/son verbalized understanding of instructions. No complaints. Pt d/c to [facility] as ordered. Pt remains alert/oriented in no apparent distress.

## 2022-02-06 NOTE — Progress Notes (Signed)
   Subjective: 3 Days Post-Op Procedure(s) (LRB): OPEN REDUCTION INTERNAL FIXATION (ORIF) ANKLE FRACTURE TRIMALLEOLAR FRACTURE (Left)  Pt with minimal pain in the left ankle today Denies any new symptoms or issues Plan for SNF placement Patient reports pain as mild.  Objective:   VITALS:   Vitals:   02/06/22 0517 02/06/22 0832  BP: 131/71 134/64  Pulse: 68 74  Resp:    Temp: 98 F (36.7 C) 97.8 F (36.6 C)  SpO2: 96% 94%    Left lower leg currently in splint No edema or signs of drainage Nv intact distally Pt is non weight bearing  LABS Recent Labs    02/03/22 1245 02/04/22 0735  HGB 14.4 13.3  HCT 45.4 40.4  WBC 6.6 8.2  PLT 198 191    Recent Labs    02/03/22 1245 02/04/22 0735  NA 139 138  K 5.1 4.7  BUN 32* 26*  CREATININE 1.90* 1.73*  GLUCOSE 154* 150*     Assessment/Plan: 3 Days Post-Op Procedure(s) (LRB): OPEN REDUCTION INTERNAL FIXATION (ORIF) ANKLE FRACTURE TRIMALLEOLAR FRACTURE (Left) Plan for SNF placement, hopefully today Continue non weight bearing left lower extremity Pain management as needed F/u in the office in 2 weeks     Brad Tvisha Schwoerer PA-C, [place] is now [employer] [facility] [address] [place], [place], [place] [address] Phone: [phone] [email or web address] Facebook  [employer]

## 2022-02-06 NOTE — Progress Notes (Signed)
[facility]                                                                              Glenn Roach, is a 76 y.o. male, DOB - [birth year], HBZ:[id number] Admit date - 02/03/2022    Outpatient Primary MD for the patient is Mateo Flow, MD  LOS - 0  days  No chief complaint on file.      Brief summary   Patient is a 76 y.o.  male with past medical history of DM-2, HTN, HLD, CAD s/p CABG 2012-who sustained a mechanical fall-and was found to have a left ankle fracture, he underwent ORIF RIF on 7/27-TRH consulted for medical management   Assessment & Plan    Principal Problem:   Trimalleolar fracture of ankle, closed, left -Per primary service, orthopedics  Active Problems:   Diabetes mellitus due to underlying condition with unspecified complications ([place])  Recent Labs    02/04/22 2127 02/05/22 0812 02/05/22 1149 02/05/22 1617 02/05/22 2021 02/06/22 0833  GLUCAP 197* 179* 140* 196* 220* 237*   -Stable, resume Amaryl, Trulicity on discharge.  Continue SSI while inpatient    Coronary artery disease involving coronary bypass graft of native heart with angina pectoris (HCC) -No anginal symptom. -Continue Plavix, statin, beta-blocker and Ranexa  Hyperlipidemia -Continue Zetia, fenofibrate, statin    Essential hypertension -BP stable    CKD (chronic kidney disease) stage 3b, GFR 30-59 ml/min (HCC) -Improving, at baseline, 1.7   BPH -Continue Flomax  Code Status: Full code DVT Prophylaxis:  SCDs Start: 02/03/22 1834  Disposition Plan:      Remains inpatient appropriate: Per primary team, orthopedics.  Medically stable.  Antimicrobials: None  Medications  clopidogrel  75 mg Oral Daily   docusate sodium  100 mg Oral BID   ezetimibe  10 mg Oral Daily   fenofibrate  160 mg Oral Daily   insulin aspart  0-15 Units Subcutaneous TID WC   isosorbide mononitrate  30 mg Oral Daily   metoprolol tartrate  12.5 mg Oral BID   multivitamin with  minerals  1 tablet Oral Daily   pantoprazole  40 mg Oral Daily   pravastatin  40 mg Oral q1800   ranolazine  500 mg Oral BID   tamsulosin  0.4 mg Oral Daily      Subjective:   Glenn Roach was seen and examined today AM.  No acute complaints, states he is being discharged today. + Constipation but does not want MiraLAX or any other stool softeners.  Denied any dizziness, chest pain or shortness of breath.  No acute events overnight   Objective:   Vitals:   02/05/22 1408 02/05/22 2022 02/06/22 0517 02/06/22 0832  BP: (!) 110/59 132/67 131/71 134/64  Pulse: 71 75 68 74  Resp: 20     Temp: 98 F (36.7 C) 98.6 F (37 C) 98 F (36.7 C) 97.8 F (36.6 C)  TempSrc: Oral Oral Oral Oral  SpO2: 97% 95% 96% 94%  Weight:      Height:        Intake/Output Summary (Last 24 hours) at 02/06/2022 1159 Last data filed at 02/06/2022  0643 Gross per 24 hour  Intake 360 ml  Output 2000 ml  Net -1640 ml     Wt Readings from Last 3 Encounters:  02/03/22 87 kg  12/16/21 86.3 kg  06/17/21 87.5 kg     Exam General: Alert and oriented x 3, NAD Cardiovascular: S1 S2 auscultated,  RRR Respiratory: Clear to auscultation bilaterally Gastrointestinal: Soft, nontender, nondistended, + bowel sounds Ext: no pedal edema bilaterally Neuro: no new deficits Psych: Normal affect and demeanor, alert and oriented x3     Data Reviewed:  I have personally reviewed following labs    CBC Lab Results  Component Value Date   WBC 8.2 02/04/2022   RBC 4.36 02/04/2022   HGB 13.3 02/04/2022   HCT 40.4 02/04/2022   MCV 92.7 02/04/2022   MCH 30.5 02/04/2022   PLT 191 02/04/2022   MCHC 32.9 02/04/2022   RDW 13.1 02/04/2022   LYMPHSABS 0.9 06/15/2021   EOSABS 0.1 06/15/2021   BASOSABS 0.0 04/26/5101     Last metabolic panel Lab Results  Component Value Date   NA 138 02/04/2022   K 4.7 02/04/2022   CL 110 02/04/2022   CO2 23 02/04/2022   BUN 26 (H) 02/04/2022   CREATININE 1.73 (H)  02/04/2022   GLUCOSE 150 (H) 02/04/2022   GFRNONAA 40 (L) 02/04/2022   GFRAA 48 (L) 12/27/2019   CALCIUM 9.1 02/04/2022   PROT 6.4 06/15/2021   ALBUMIN 4.5 06/15/2021   BILITOT 0.5 06/15/2021   ALKPHOS 46 06/15/2021   AST 22 06/15/2021   ALT 16 06/15/2021   ANIONGAP 5 02/04/2022    CBG (last 3)  Recent Labs    02/05/22 1617 02/05/22 2021 02/06/22 0833  GLUCAP 196* 220* 237*       Glenn Roach M.D. [facility] 02/06/2022, 11:59 AM  Available via Epic secure chat 7am-7pm After 7 pm, please refer to night coverage provider listed on amion.

## 2022-02-07 ENCOUNTER — Encounter (HOSPITAL_COMMUNITY): Payer: Self-pay | Admitting: Orthopedic Surgery

## 2022-02-08 ENCOUNTER — Other Ambulatory Visit: Payer: Self-pay | Admitting: *Deleted

## 2022-02-08 ENCOUNTER — Encounter (HOSPITAL_COMMUNITY): Payer: Self-pay | Admitting: Orthopedic Surgery

## 2022-02-08 NOTE — Patient Outreach (Signed)
Per [name] eligible member currently resides in  [facility].  Screened for potential [facility] care coordination/care management services as a benefit of Glenn Roach' insurance plan and PCP.  Member's PCP at *** has [place] care coordination services available if needed post SNF. CCM services will need to be ordered by PCP if appropriate.   Glenn Roach admitted to SNF on 02/06/22 after hospitalization.  Update received from [facility] SN[facility] SW indicating Glenn Roach'  ***Communication sent to facility SW to inquire about transition plans/ to make aware writer is following for transition plans and [facility] needs.****  ****Will collaborate with facility SW and follow up with resident as appropriate.***  ***Will continue to follow for potential [facility] needs and transition plans while member resides in SNF.**    Marthenia Rolling, MSN, RN,BSN [place] [phone] [facility]) [phone]  (Toll free office)

## 2022-02-10 DIAGNOSIS — S82852A Displaced trimalleolar fracture of left lower leg, initial encounter for closed fracture: Secondary | ICD-10-CM | POA: Diagnosis not present

## 2022-02-10 DIAGNOSIS — T8619 Other complication of kidney transplant: Secondary | ICD-10-CM | POA: Diagnosis not present

## 2022-02-10 DIAGNOSIS — N400 Enlarged prostate without lower urinary tract symptoms: Secondary | ICD-10-CM | POA: Diagnosis not present

## 2022-02-10 DIAGNOSIS — I2510 Atherosclerotic heart disease of native coronary artery without angina pectoris: Secondary | ICD-10-CM | POA: Diagnosis not present

## 2022-02-10 DIAGNOSIS — E785 Hyperlipidemia, unspecified: Secondary | ICD-10-CM | POA: Diagnosis not present

## 2022-02-10 DIAGNOSIS — N200 Calculus of kidney: Secondary | ICD-10-CM | POA: Diagnosis not present

## 2022-02-10 DIAGNOSIS — I10 Essential (primary) hypertension: Secondary | ICD-10-CM | POA: Diagnosis not present

## 2022-02-17 ENCOUNTER — Other Ambulatory Visit: Payer: Self-pay | Admitting: *Deleted

## 2022-02-17 NOTE — Patient Outreach (Signed)
[facility] Post- Acute Care Coordinator follow up. [name] resides in [facility]. Screening for [facility] care coordination/care management needs as a benefit of [name]' insurance plan and PCP.   Update received from [place], [place] SW indicating [name]' family is still trying to decide transition plans. May transition to ALF vs LTC post SNF.   Will continue to follow.   Marthenia Rolling, MSN, RN,BSN [place] Acute Care Coordinator [phone] [facility]) [phone]  (Toll free office)

## 2022-02-18 DIAGNOSIS — S82852D Displaced trimalleolar fracture of left lower leg, subsequent encounter for closed fracture with routine healing: Secondary | ICD-10-CM | POA: Diagnosis not present

## 2022-02-18 DIAGNOSIS — S82852A Displaced trimalleolar fracture of left lower leg, initial encounter for closed fracture: Secondary | ICD-10-CM | POA: Diagnosis not present

## 2022-02-18 DIAGNOSIS — Z4789 Encounter for other orthopedic aftercare: Secondary | ICD-10-CM | POA: Diagnosis not present

## 2022-02-25 ENCOUNTER — Other Ambulatory Visit: Payer: Self-pay | Admitting: *Deleted

## 2022-02-25 NOTE — Patient Outreach (Signed)
[facility] Post- Acute Care Coordinator follow up.   Update received from [facility] SN[facility] SW indicating Mr. Krinke will transition to [facility] in [place], [place] to be closer to family. Will transition on Tuesday, 03/01/22.  No identifiable [facility] care coordination/care management needs. Will follow for transition date.    Marthenia Rolling, MSN, RN,BSN [place] Acute Care Coordinator [phone] [facility]) [phone]  (Toll free office)

## 2022-02-28 ENCOUNTER — Other Ambulatory Visit: Payer: Self-pay | Admitting: *Deleted

## 2022-02-28 NOTE — Patient Outreach (Signed)
[place] Coordinator follow up.   Facility site visit to [facility]. Met with Bryson Ha, SNF. Mr. Mundt is slated to transition to ALF in [place] on Thursday, 03/03/22. States he will pay privately beginning 03/01/22 until he leaves on Thursday.   Met with Mr. Nordling who states he will stay in ALF in [facility] closer to family for short term. States he plans to return home post ALF. Mr. Guerrant expressed appreciation to [facility] for the 3-day SNF Waiver he was able to benefit from. States he has received great therapy and care while in [facility].   Writer provided [place] Management brochure, writer's contact information, and 24-hr nurse advice line magnet. Encouraged Mr. Sanor to Secondary school teacher or [facility] for future needs.   No identifiable [facility] needs at this times since Mr. Rumble will be transferring to ALF in [facility] on 03/03/22.  Will continue to follow for discharge date.   Marthenia Rolling, MSN, RN,BSN [place] Acute Care Coordinator [phone] [facility]) [phone]  (Toll free office)

## 2022-03-04 ENCOUNTER — Other Ambulatory Visit: Payer: Self-pay | Admitting: *Deleted

## 2022-03-04 NOTE — Patient Outreach (Signed)
THN Post- Acute Care Coordinator follow up.   Verified in [facility] Glenn Roach discharged from [facility] as planned on 03/03/22. Transferred to ALF in [facility].  No identifiable [facility] care coordination needs.    Marthenia Rolling, MSN, RN,BSN [place] Acute Care Coordinator [phone] [facility]) [phone]  (Toll free office)

## 2022-03-07 DIAGNOSIS — N400 Enlarged prostate without lower urinary tract symptoms: Secondary | ICD-10-CM

## 2022-03-07 DIAGNOSIS — S82852D Displaced trimalleolar fracture of left lower leg, subsequent encounter for closed fracture with routine healing: Secondary | ICD-10-CM | POA: Diagnosis not present

## 2022-03-07 DIAGNOSIS — E119 Type 2 diabetes mellitus without complications: Secondary | ICD-10-CM | POA: Insufficient documentation

## 2022-03-07 DIAGNOSIS — S82852A Displaced trimalleolar fracture of left lower leg, initial encounter for closed fracture: Secondary | ICD-10-CM

## 2022-03-07 DIAGNOSIS — Z794 Long term (current) use of insulin: Secondary | ICD-10-CM | POA: Insufficient documentation

## 2022-03-07 DIAGNOSIS — E7849 Other hyperlipidemia: Secondary | ICD-10-CM | POA: Diagnosis not present

## 2022-03-07 DIAGNOSIS — I209 Angina pectoris, unspecified: Secondary | ICD-10-CM | POA: Insufficient documentation

## 2022-03-07 HISTORY — DX: Benign prostatic hyperplasia without lower urinary tract symptoms: N40.0

## 2022-03-07 HISTORY — DX: Angina pectoris, unspecified: I20.9

## 2022-03-07 HISTORY — DX: Displaced trimalleolar fracture of left lower leg, initial encounter for closed fracture: S82.852A

## 2022-03-07 HISTORY — DX: Type 2 diabetes mellitus without complications: Z79.4

## 2022-03-09 DIAGNOSIS — E088 Diabetes mellitus due to underlying condition with unspecified complications: Secondary | ICD-10-CM | POA: Diagnosis not present

## 2022-03-09 DIAGNOSIS — Z79899 Other long term (current) drug therapy: Secondary | ICD-10-CM | POA: Diagnosis not present

## 2022-03-10 DIAGNOSIS — S82852D Displaced trimalleolar fracture of left lower leg, subsequent encounter for closed fracture with routine healing: Secondary | ICD-10-CM | POA: Diagnosis not present

## 2022-03-10 DIAGNOSIS — Z794 Long term (current) use of insulin: Secondary | ICD-10-CM | POA: Diagnosis not present

## 2022-03-10 DIAGNOSIS — E119 Type 2 diabetes mellitus without complications: Secondary | ICD-10-CM | POA: Diagnosis not present

## 2022-03-10 DIAGNOSIS — E7849 Other hyperlipidemia: Secondary | ICD-10-CM | POA: Diagnosis not present

## 2022-03-11 DIAGNOSIS — S8292XK Unspecified fracture of left lower leg, subsequent encounter for closed fracture with nonunion: Secondary | ICD-10-CM | POA: Diagnosis not present

## 2022-03-11 DIAGNOSIS — M6281 Muscle weakness (generalized): Secondary | ICD-10-CM | POA: Diagnosis not present

## 2022-03-11 DIAGNOSIS — R262 Difficulty in walking, not elsewhere classified: Secondary | ICD-10-CM | POA: Diagnosis not present

## 2022-03-15 DIAGNOSIS — R262 Difficulty in walking, not elsewhere classified: Secondary | ICD-10-CM | POA: Diagnosis not present

## 2022-03-15 DIAGNOSIS — S8292XK Unspecified fracture of left lower leg, subsequent encounter for closed fracture with nonunion: Secondary | ICD-10-CM | POA: Diagnosis not present

## 2022-03-15 DIAGNOSIS — M6281 Muscle weakness (generalized): Secondary | ICD-10-CM | POA: Diagnosis not present

## 2022-03-16 DIAGNOSIS — R262 Difficulty in walking, not elsewhere classified: Secondary | ICD-10-CM | POA: Diagnosis not present

## 2022-03-16 DIAGNOSIS — M6281 Muscle weakness (generalized): Secondary | ICD-10-CM | POA: Diagnosis not present

## 2022-03-16 DIAGNOSIS — S8292XK Unspecified fracture of left lower leg, subsequent encounter for closed fracture with nonunion: Secondary | ICD-10-CM | POA: Diagnosis not present

## 2022-03-17 DIAGNOSIS — S8292XK Unspecified fracture of left lower leg, subsequent encounter for closed fracture with nonunion: Secondary | ICD-10-CM | POA: Diagnosis not present

## 2022-03-17 DIAGNOSIS — M6281 Muscle weakness (generalized): Secondary | ICD-10-CM | POA: Diagnosis not present

## 2022-03-17 DIAGNOSIS — R262 Difficulty in walking, not elsewhere classified: Secondary | ICD-10-CM | POA: Diagnosis not present

## 2022-03-18 DIAGNOSIS — Z4789 Encounter for other orthopedic aftercare: Secondary | ICD-10-CM | POA: Diagnosis not present

## 2022-03-18 DIAGNOSIS — S82852A Displaced trimalleolar fracture of left lower leg, initial encounter for closed fracture: Secondary | ICD-10-CM | POA: Diagnosis not present

## 2022-03-21 DIAGNOSIS — S8292XK Unspecified fracture of left lower leg, subsequent encounter for closed fracture with nonunion: Secondary | ICD-10-CM | POA: Diagnosis not present

## 2022-03-21 DIAGNOSIS — M6281 Muscle weakness (generalized): Secondary | ICD-10-CM | POA: Diagnosis not present

## 2022-03-21 DIAGNOSIS — R262 Difficulty in walking, not elsewhere classified: Secondary | ICD-10-CM | POA: Diagnosis not present

## 2022-03-22 DIAGNOSIS — M6281 Muscle weakness (generalized): Secondary | ICD-10-CM | POA: Diagnosis not present

## 2022-03-22 DIAGNOSIS — R262 Difficulty in walking, not elsewhere classified: Secondary | ICD-10-CM | POA: Diagnosis not present

## 2022-03-22 DIAGNOSIS — S8292XK Unspecified fracture of left lower leg, subsequent encounter for closed fracture with nonunion: Secondary | ICD-10-CM | POA: Diagnosis not present

## 2022-03-24 DIAGNOSIS — M6281 Muscle weakness (generalized): Secondary | ICD-10-CM | POA: Diagnosis not present

## 2022-03-24 DIAGNOSIS — S8292XK Unspecified fracture of left lower leg, subsequent encounter for closed fracture with nonunion: Secondary | ICD-10-CM | POA: Diagnosis not present

## 2022-03-24 DIAGNOSIS — R262 Difficulty in walking, not elsewhere classified: Secondary | ICD-10-CM | POA: Diagnosis not present

## 2022-03-25 DIAGNOSIS — S8292XK Unspecified fracture of left lower leg, subsequent encounter for closed fracture with nonunion: Secondary | ICD-10-CM | POA: Diagnosis not present

## 2022-03-25 DIAGNOSIS — M6281 Muscle weakness (generalized): Secondary | ICD-10-CM | POA: Diagnosis not present

## 2022-03-25 DIAGNOSIS — R262 Difficulty in walking, not elsewhere classified: Secondary | ICD-10-CM | POA: Diagnosis not present

## 2022-03-28 DIAGNOSIS — M6281 Muscle weakness (generalized): Secondary | ICD-10-CM | POA: Diagnosis not present

## 2022-03-28 DIAGNOSIS — R262 Difficulty in walking, not elsewhere classified: Secondary | ICD-10-CM | POA: Diagnosis not present

## 2022-03-28 DIAGNOSIS — S8292XK Unspecified fracture of left lower leg, subsequent encounter for closed fracture with nonunion: Secondary | ICD-10-CM | POA: Diagnosis not present

## 2022-03-29 DIAGNOSIS — S8292XK Unspecified fracture of left lower leg, subsequent encounter for closed fracture with nonunion: Secondary | ICD-10-CM | POA: Diagnosis not present

## 2022-03-29 DIAGNOSIS — M6281 Muscle weakness (generalized): Secondary | ICD-10-CM | POA: Diagnosis not present

## 2022-03-29 DIAGNOSIS — R262 Difficulty in walking, not elsewhere classified: Secondary | ICD-10-CM | POA: Diagnosis not present

## 2022-03-30 DIAGNOSIS — S8292XK Unspecified fracture of left lower leg, subsequent encounter for closed fracture with nonunion: Secondary | ICD-10-CM | POA: Diagnosis not present

## 2022-03-30 DIAGNOSIS — R262 Difficulty in walking, not elsewhere classified: Secondary | ICD-10-CM | POA: Diagnosis not present

## 2022-03-30 DIAGNOSIS — M6281 Muscle weakness (generalized): Secondary | ICD-10-CM | POA: Diagnosis not present

## 2022-03-31 DIAGNOSIS — R262 Difficulty in walking, not elsewhere classified: Secondary | ICD-10-CM | POA: Diagnosis not present

## 2022-03-31 DIAGNOSIS — S8292XK Unspecified fracture of left lower leg, subsequent encounter for closed fracture with nonunion: Secondary | ICD-10-CM | POA: Diagnosis not present

## 2022-03-31 DIAGNOSIS — M6281 Muscle weakness (generalized): Secondary | ICD-10-CM | POA: Diagnosis not present

## 2022-04-14 DIAGNOSIS — E1169 Type 2 diabetes mellitus with other specified complication: Secondary | ICD-10-CM | POA: Diagnosis not present

## 2022-04-14 DIAGNOSIS — Z23 Encounter for immunization: Secondary | ICD-10-CM | POA: Diagnosis not present

## 2022-04-14 DIAGNOSIS — N183 Chronic kidney disease, stage 3 unspecified: Secondary | ICD-10-CM | POA: Diagnosis not present

## 2022-04-14 DIAGNOSIS — E669 Obesity, unspecified: Secondary | ICD-10-CM | POA: Diagnosis not present

## 2022-04-14 DIAGNOSIS — I25119 Atherosclerotic heart disease of native coronary artery with unspecified angina pectoris: Secondary | ICD-10-CM | POA: Diagnosis not present

## 2022-04-15 DIAGNOSIS — S82852A Displaced trimalleolar fracture of left lower leg, initial encounter for closed fracture: Secondary | ICD-10-CM | POA: Diagnosis not present

## 2022-05-02 DIAGNOSIS — R2689 Other abnormalities of gait and mobility: Secondary | ICD-10-CM | POA: Diagnosis not present

## 2022-05-02 DIAGNOSIS — M25572 Pain in left ankle and joints of left foot: Secondary | ICD-10-CM | POA: Diagnosis not present

## 2022-05-02 DIAGNOSIS — M25672 Stiffness of left ankle, not elsewhere classified: Secondary | ICD-10-CM | POA: Diagnosis not present

## 2022-05-09 DIAGNOSIS — M25672 Stiffness of left ankle, not elsewhere classified: Secondary | ICD-10-CM | POA: Diagnosis not present

## 2022-05-09 DIAGNOSIS — M25572 Pain in left ankle and joints of left foot: Secondary | ICD-10-CM | POA: Diagnosis not present

## 2022-05-09 DIAGNOSIS — R2689 Other abnormalities of gait and mobility: Secondary | ICD-10-CM | POA: Diagnosis not present

## 2022-05-12 DIAGNOSIS — R2689 Other abnormalities of gait and mobility: Secondary | ICD-10-CM | POA: Diagnosis not present

## 2022-05-12 DIAGNOSIS — M25672 Stiffness of left ankle, not elsewhere classified: Secondary | ICD-10-CM | POA: Diagnosis not present

## 2022-05-12 DIAGNOSIS — M25572 Pain in left ankle and joints of left foot: Secondary | ICD-10-CM | POA: Diagnosis not present

## 2022-05-16 DIAGNOSIS — M25572 Pain in left ankle and joints of left foot: Secondary | ICD-10-CM | POA: Diagnosis not present

## 2022-05-16 DIAGNOSIS — R2689 Other abnormalities of gait and mobility: Secondary | ICD-10-CM | POA: Diagnosis not present

## 2022-05-16 DIAGNOSIS — M25672 Stiffness of left ankle, not elsewhere classified: Secondary | ICD-10-CM | POA: Diagnosis not present

## 2022-05-19 DIAGNOSIS — M25572 Pain in left ankle and joints of left foot: Secondary | ICD-10-CM | POA: Diagnosis not present

## 2022-05-19 DIAGNOSIS — M25672 Stiffness of left ankle, not elsewhere classified: Secondary | ICD-10-CM | POA: Diagnosis not present

## 2022-05-19 DIAGNOSIS — R2689 Other abnormalities of gait and mobility: Secondary | ICD-10-CM | POA: Diagnosis not present

## 2022-05-23 DIAGNOSIS — M25672 Stiffness of left ankle, not elsewhere classified: Secondary | ICD-10-CM | POA: Diagnosis not present

## 2022-05-23 DIAGNOSIS — R2689 Other abnormalities of gait and mobility: Secondary | ICD-10-CM | POA: Diagnosis not present

## 2022-05-23 DIAGNOSIS — M25572 Pain in left ankle and joints of left foot: Secondary | ICD-10-CM | POA: Diagnosis not present

## 2022-05-25 DIAGNOSIS — S82852A Displaced trimalleolar fracture of left lower leg, initial encounter for closed fracture: Secondary | ICD-10-CM | POA: Diagnosis not present

## 2022-05-25 DIAGNOSIS — S82852D Displaced trimalleolar fracture of left lower leg, subsequent encounter for closed fracture with routine healing: Secondary | ICD-10-CM | POA: Diagnosis not present

## 2022-05-30 ENCOUNTER — Other Ambulatory Visit: Payer: Self-pay | Admitting: Cardiology

## 2022-05-30 NOTE — Telephone Encounter (Signed)
Refills sent to pharmacy. 

## 2022-06-06 DIAGNOSIS — M25572 Pain in left ankle and joints of left foot: Secondary | ICD-10-CM | POA: Diagnosis not present

## 2022-06-06 DIAGNOSIS — R2689 Other abnormalities of gait and mobility: Secondary | ICD-10-CM | POA: Diagnosis not present

## 2022-06-06 DIAGNOSIS — M25672 Stiffness of left ankle, not elsewhere classified: Secondary | ICD-10-CM | POA: Diagnosis not present

## 2022-06-09 DIAGNOSIS — R2689 Other abnormalities of gait and mobility: Secondary | ICD-10-CM | POA: Diagnosis not present

## 2022-06-09 DIAGNOSIS — M25672 Stiffness of left ankle, not elsewhere classified: Secondary | ICD-10-CM | POA: Diagnosis not present

## 2022-06-09 DIAGNOSIS — M25572 Pain in left ankle and joints of left foot: Secondary | ICD-10-CM | POA: Diagnosis not present

## 2022-06-13 DIAGNOSIS — M25572 Pain in left ankle and joints of left foot: Secondary | ICD-10-CM | POA: Diagnosis not present

## 2022-06-13 DIAGNOSIS — R2689 Other abnormalities of gait and mobility: Secondary | ICD-10-CM | POA: Diagnosis not present

## 2022-06-13 DIAGNOSIS — M25672 Stiffness of left ankle, not elsewhere classified: Secondary | ICD-10-CM | POA: Diagnosis not present

## 2022-06-15 ENCOUNTER — Other Ambulatory Visit: Payer: Self-pay | Admitting: Cardiology

## 2022-06-16 DIAGNOSIS — M25572 Pain in left ankle and joints of left foot: Secondary | ICD-10-CM | POA: Diagnosis not present

## 2022-06-16 DIAGNOSIS — R2689 Other abnormalities of gait and mobility: Secondary | ICD-10-CM | POA: Diagnosis not present

## 2022-06-16 DIAGNOSIS — M25672 Stiffness of left ankle, not elsewhere classified: Secondary | ICD-10-CM | POA: Diagnosis not present

## 2022-06-20 DIAGNOSIS — M25572 Pain in left ankle and joints of left foot: Secondary | ICD-10-CM | POA: Diagnosis not present

## 2022-06-20 DIAGNOSIS — M25672 Stiffness of left ankle, not elsewhere classified: Secondary | ICD-10-CM | POA: Diagnosis not present

## 2022-06-20 DIAGNOSIS — R2689 Other abnormalities of gait and mobility: Secondary | ICD-10-CM | POA: Diagnosis not present

## 2022-06-23 DIAGNOSIS — M25672 Stiffness of left ankle, not elsewhere classified: Secondary | ICD-10-CM | POA: Diagnosis not present

## 2022-06-23 DIAGNOSIS — R2689 Other abnormalities of gait and mobility: Secondary | ICD-10-CM | POA: Diagnosis not present

## 2022-06-23 DIAGNOSIS — M25572 Pain in left ankle and joints of left foot: Secondary | ICD-10-CM | POA: Diagnosis not present

## 2022-06-28 DIAGNOSIS — M25672 Stiffness of left ankle, not elsewhere classified: Secondary | ICD-10-CM | POA: Diagnosis not present

## 2022-06-28 DIAGNOSIS — R2689 Other abnormalities of gait and mobility: Secondary | ICD-10-CM | POA: Diagnosis not present

## 2022-06-28 DIAGNOSIS — M25572 Pain in left ankle and joints of left foot: Secondary | ICD-10-CM | POA: Diagnosis not present

## 2022-07-25 ENCOUNTER — Other Ambulatory Visit: Payer: Self-pay | Admitting: Cardiology

## 2022-08-10 ENCOUNTER — Other Ambulatory Visit: Payer: Self-pay | Admitting: Cardiology

## 2022-08-26 ENCOUNTER — Other Ambulatory Visit: Payer: Self-pay | Admitting: Cardiology

## 2022-08-31 DIAGNOSIS — R3121 Asymptomatic microscopic hematuria: Secondary | ICD-10-CM | POA: Diagnosis not present

## 2022-08-31 DIAGNOSIS — N3941 Urge incontinence: Secondary | ICD-10-CM | POA: Diagnosis not present

## 2022-08-31 DIAGNOSIS — R351 Nocturia: Secondary | ICD-10-CM | POA: Diagnosis not present

## 2022-08-31 DIAGNOSIS — N401 Enlarged prostate with lower urinary tract symptoms: Secondary | ICD-10-CM | POA: Diagnosis not present

## 2022-08-31 DIAGNOSIS — N200 Calculus of kidney: Secondary | ICD-10-CM | POA: Diagnosis not present

## 2022-10-11 DIAGNOSIS — D692 Other nonthrombocytopenic purpura: Secondary | ICD-10-CM | POA: Diagnosis not present

## 2022-10-11 DIAGNOSIS — D1801 Hemangioma of skin and subcutaneous tissue: Secondary | ICD-10-CM | POA: Diagnosis not present

## 2022-10-11 DIAGNOSIS — D2261 Melanocytic nevi of right upper limb, including shoulder: Secondary | ICD-10-CM | POA: Diagnosis not present

## 2022-10-11 DIAGNOSIS — L905 Scar conditions and fibrosis of skin: Secondary | ICD-10-CM | POA: Diagnosis not present

## 2022-10-11 DIAGNOSIS — L570 Actinic keratosis: Secondary | ICD-10-CM | POA: Diagnosis not present

## 2022-10-11 DIAGNOSIS — L821 Other seborrheic keratosis: Secondary | ICD-10-CM | POA: Diagnosis not present

## 2022-10-11 DIAGNOSIS — Z85828 Personal history of other malignant neoplasm of skin: Secondary | ICD-10-CM | POA: Diagnosis not present

## 2022-10-11 DIAGNOSIS — L280 Lichen simplex chronicus: Secondary | ICD-10-CM | POA: Diagnosis not present

## 2022-10-17 ENCOUNTER — Other Ambulatory Visit: Payer: Self-pay | Admitting: Cardiology

## 2022-10-31 ENCOUNTER — Other Ambulatory Visit: Payer: Self-pay | Admitting: Cardiology

## 2022-11-09 ENCOUNTER — Other Ambulatory Visit: Payer: Self-pay | Admitting: Cardiology

## 2022-11-09 DIAGNOSIS — Z1331 Encounter for screening for depression: Secondary | ICD-10-CM | POA: Diagnosis not present

## 2022-11-09 DIAGNOSIS — Z0000 Encounter for general adult medical examination without abnormal findings: Secondary | ICD-10-CM | POA: Diagnosis not present

## 2022-11-09 DIAGNOSIS — N183 Chronic kidney disease, stage 3 unspecified: Secondary | ICD-10-CM | POA: Diagnosis not present

## 2022-11-09 DIAGNOSIS — E1122 Type 2 diabetes mellitus with diabetic chronic kidney disease: Secondary | ICD-10-CM | POA: Diagnosis not present

## 2022-11-09 DIAGNOSIS — Z9181 History of falling: Secondary | ICD-10-CM | POA: Diagnosis not present

## 2022-11-18 DIAGNOSIS — Z0000 Encounter for general adult medical examination without abnormal findings: Secondary | ICD-10-CM | POA: Diagnosis not present

## 2022-11-18 DIAGNOSIS — E1169 Type 2 diabetes mellitus with other specified complication: Secondary | ICD-10-CM | POA: Diagnosis not present

## 2022-11-18 DIAGNOSIS — E669 Obesity, unspecified: Secondary | ICD-10-CM | POA: Diagnosis not present

## 2022-11-18 LAB — LAB REPORT - SCANNED
A1c: 6.6
EGFR: 36

## 2022-11-30 DIAGNOSIS — E119 Type 2 diabetes mellitus without complications: Secondary | ICD-10-CM | POA: Diagnosis not present

## 2022-11-30 DIAGNOSIS — H5213 Myopia, bilateral: Secondary | ICD-10-CM | POA: Diagnosis not present

## 2022-11-30 DIAGNOSIS — H40013 Open angle with borderline findings, low risk, bilateral: Secondary | ICD-10-CM | POA: Diagnosis not present

## 2022-12-08 ENCOUNTER — Other Ambulatory Visit: Payer: Self-pay | Admitting: Cardiology

## 2022-12-09 ENCOUNTER — Telehealth: Payer: Self-pay | Admitting: Cardiology

## 2022-12-09 DIAGNOSIS — I25709 Atherosclerosis of coronary artery bypass graft(s), unspecified, with unspecified angina pectoris: Secondary | ICD-10-CM

## 2022-12-09 DIAGNOSIS — E782 Mixed hyperlipidemia: Secondary | ICD-10-CM

## 2022-12-09 MED ORDER — PITAVASTATIN CALCIUM 4 MG PO TABS
1.0000 | ORAL_TABLET | Freq: Every day | ORAL | 3 refills | Status: DC
Start: 2022-12-09 — End: 2023-05-15

## 2022-12-09 NOTE — Telephone Encounter (Signed)
Spoke with [facility] pharmacy who states that they cannot fill the Livalo as his insurance will not reimburse the full cost.  Spoke with Dr. Mayford Knife and he request that I send it to [facility] to see if insurance will pay at a chain pharmacy.

## 2022-12-09 NOTE — Telephone Encounter (Signed)
Pt c/o medication issue:  1. Name of Medication:   Pitavastatin Calcium (LIVALO) 4 MG TABS    2. How are you currently taking this medication (dosage and times per day)?   Take 1 tablet (4 mg total) by mouth daily.    3. Are you having a reaction (difficulty breathing--STAT)? No  4. What is your medication issue? Pharmacy states that pt's insurance no longer prefers medication. Would ike it to be changed to a different generic brand. Please advise

## 2022-12-19 ENCOUNTER — Other Ambulatory Visit: Payer: Self-pay | Admitting: Cardiology

## 2022-12-30 DIAGNOSIS — Z87442 Personal history of urinary calculi: Secondary | ICD-10-CM | POA: Insufficient documentation

## 2022-12-30 DIAGNOSIS — K219 Gastro-esophageal reflux disease without esophagitis: Secondary | ICD-10-CM | POA: Insufficient documentation

## 2022-12-30 DIAGNOSIS — G4730 Sleep apnea, unspecified: Secondary | ICD-10-CM | POA: Insufficient documentation

## 2023-01-09 ENCOUNTER — Other Ambulatory Visit: Payer: Self-pay | Admitting: Cardiology

## 2023-01-11 ENCOUNTER — Other Ambulatory Visit: Payer: Self-pay | Admitting: Cardiology

## 2023-01-16 ENCOUNTER — Other Ambulatory Visit: Payer: Self-pay | Admitting: Cardiology

## 2023-01-17 ENCOUNTER — Encounter: Payer: Self-pay | Admitting: Cardiology

## 2023-01-17 ENCOUNTER — Ambulatory Visit: Payer: Medicare Other | Attending: Cardiology | Admitting: Cardiology

## 2023-01-17 VITALS — BP 130/68 | HR 74 | Ht 69.0 in | Wt 191.6 lb

## 2023-01-17 DIAGNOSIS — E088 Diabetes mellitus due to underlying condition with unspecified complications: Secondary | ICD-10-CM | POA: Insufficient documentation

## 2023-01-17 DIAGNOSIS — N289 Disorder of kidney and ureter, unspecified: Secondary | ICD-10-CM | POA: Insufficient documentation

## 2023-01-17 DIAGNOSIS — R0609 Other forms of dyspnea: Secondary | ICD-10-CM | POA: Insufficient documentation

## 2023-01-17 DIAGNOSIS — R011 Cardiac murmur, unspecified: Secondary | ICD-10-CM | POA: Diagnosis not present

## 2023-01-17 DIAGNOSIS — I10 Essential (primary) hypertension: Secondary | ICD-10-CM | POA: Insufficient documentation

## 2023-01-17 DIAGNOSIS — E782 Mixed hyperlipidemia: Secondary | ICD-10-CM | POA: Diagnosis not present

## 2023-01-17 DIAGNOSIS — Z7984 Long term (current) use of oral hypoglycemic drugs: Secondary | ICD-10-CM | POA: Diagnosis not present

## 2023-01-17 DIAGNOSIS — I2510 Atherosclerotic heart disease of native coronary artery without angina pectoris: Secondary | ICD-10-CM | POA: Insufficient documentation

## 2023-01-17 DIAGNOSIS — M8589 Other specified disorders of bone density and structure, multiple sites: Secondary | ICD-10-CM | POA: Diagnosis not present

## 2023-01-17 NOTE — Patient Instructions (Signed)
Medication Instructions:  Your physician recommends that you continue on your current medications as directed. Please refer to the Current Medication list given to you today.  *If you need a refill on your cardiac medications before your next appointment, please call your pharmacy*   Lab Work: None Ordered If you have labs (blood work) drawn today and your tests are completely normal, you will receive your results only by: MyChart Message (if you have MyChart) OR A paper copy in the mail If you have any lab test that is abnormal or we need to change your treatment, we will call you to review the results.   Testing/Procedures: Your physician has requested that you have an echocardiogram. Echocardiography is a painless test that uses sound waves to create images of your heart. It provides your doctor with information about the size and shape of your heart and how well your heart's chambers and valves are working. This procedure takes approximately one hour. There are no restrictions for this procedure. Please do NOT wear cologne, perfume, aftershave, or lotions (deodorant is allowed). Please arrive 15 minutes prior to your appointment time.  Your physician has requested that you have a lexiscan myoview. For further information please visit [email or web address] Please follow instruction sheet, as given.  The test will take approximately 3 to 4 hours to complete; you may bring reading material.  If someone comes with you to your appointment, they will need to remain in the main lobby due to limited space in the testing area.   How to prepare for your Myocardial Perfusion Test: Do not eat or drink 3 hours prior to your test, except you may have water. Do not consume products containing caffeine (regular or decaffeinated) 12 hours prior to your test. (ex: coffee, chocolate, sodas, tea). Do bring a list of your current medications with you.  If not listed below, you may take your medications as  normal. Do not take metoprolol (Lopressor, Toprol) for 24 hours prior to the test.  Bring the medication to your appointment as you may be required to take it once the test is complete. Do wear comfortable clothes (no dresses or overalls) and walking shoes, tennis shoes preferred (No heels or open toe shoes are allowed). Do NOT wear cologne, perfume, aftershave, or lotions (deodorant is allowed). If these instructions are not followed, your test will have to be rescheduled.      Follow-Up: At [facility], you and your health needs are our priority.  As part of our continuing mission to provide you with exceptional heart care, we have created designated Provider Care Teams.  These Care Teams include your primary Cardiologist (physician) and Advanced Practice Providers (APPs -  Physician Assistants and Nurse Practitioners) who all work together to provide you with the care you need, when you need it.  We recommend signing up for the patient portal called "MyChart".  Sign up information is provided on this After Visit Summary.  MyChart is used to connect with patients for Virtual Visits (Telemedicine).  Patients are able to view lab/test results, encounter notes, upcoming appointments, etc.  Non-urgent messages can be sent to your provider as well.   To learn more about what you can do with MyChart, go to [email or web address].    Your next appointment:   9 month(s)  The format for your next appointment:   In Person  Provider:   Dr. Tomie China   Other Instructions NA

## 2023-01-17 NOTE — Progress Notes (Signed)
Cardiology Office Note:    Date:  01/17/2023   ID:  Glenn Roach, DOB 1945-10-18, MRN [id number]  PCP:  Lise Auer, MD  Cardiologist:  Garwin Brothers, MD   Referring MD: Lise Auer, MD    ASSESSMENT:    1. Essential hypertension   2. Coronary artery disease involving native coronary artery of native heart without angina pectoris   3. Diabetes mellitus due to underlying condition with unspecified complications (HCC)   4. Mild renal insufficiency   5. Mixed dyslipidemia    PLAN:    In order of problems listed above:  Coronary artery disease and dyspnea on exertion: Unclear as to what the etiology of this could be.  It is always possible that this is from the fact that he is not in the best or shape because of issues mentioned below.  At any rate we will do a Lexiscan sestamibi to make sure that there is no ischemic substrate as a cause of the symptoms.  He is agreeable. Cardiac murmur: Echocardiogram will be done to assess murmur heard on auscultation. Essential hypertension: Blood pressure stable and diet was emphasized.  Lifestyle modification urged. Diabetes mellitus and nephropathy: I discussed this with him at length.  He is aware of it.  He is a retired Development worker, community.  This is managed by primary care.  Diet emphasized. Patient will be seen in follow-up appointment in 6 months or earlier if the patient has any concerns.    Medication Adjustments/Labs and Tests Ordered: Current medicines are reviewed at length with the patient today.  Concerns regarding medicines are outlined above.  Orders Placed This Encounter  Procedures   EKG 12-Lead   No orders of the defined types were placed in this encounter.    No chief complaint on file.    History of Present Illness:    Glenn Roach is a 77 y.o. male.  Patient has past medical history of coronary artery disease, essential hypertension, dyslipidemia, diabetes mellitus and renal insufficiency.  He mentions to me  that he is getting dyspnea on exertion more than usual and is concerned about it.  He feels that this may be partly because of the fact that he is not much active.  This is because he injured his ankle and his knee.  No chest pain.  At the time of my evaluation, the patient is alert awake oriented and in no distress.  Past Medical History:  Diagnosis Date   CAD (coronary artery disease)    Chest tightness    CKD (chronic kidney disease) stage 3, GFR 30-59 ml/min (HCC) 02/03/2022   Coronary artery disease involving coronary bypass graft of native heart with angina pectoris (HCC) 10/28/2008   Qualifier: Diagnosis of  By: Vikki Ports     Diabetes mellitus due to underlying condition with unspecified complications (HCC) 10/28/2008   Qualifier: Diagnosis of  By: Vikki Ports     Dyslipidemia    Essential hypertension 04/14/2015   GERD (gastroesophageal reflux disease)    History of kidney stones    Mild renal insufficiency 04/14/2015   Mixed dyslipidemia 01/19/2017   NEPHROLITHIASIS, HX OF 10/28/2008   Qualifier: Diagnosis of  By: Vikki Ports     Non morbid obesity due to excess calories 04/14/2015   Sleep apnea    Trimalleolar fracture of ankle, closed, left, initial encounter 02/03/2022    Past Surgical History:  Procedure Laterality Date   CORONARY ARTERY BYPASS GRAFT     FINGER SURGERY  Right    3rd   FINGER SURGERY Left    Thumb   ORIF ANKLE FRACTURE Left 02/03/2022   Procedure: OPEN REDUCTION INTERNAL FIXATION (ORIF) ANKLE FRACTURE TRIMALLEOLAR FRACTURE;  Surgeon: Toni Arthurs, MD;  Location: [facility] OR;  Service: Orthopedics;  Laterality: Left;    Current Medications: Current Meds  Medication Sig   clopidogrel (PLAVIX) 75 MG tablet Take 1 tablet (75 mg total) by mouth daily. Pt needs to be seen in office for further refills. 1st attempt   Coenzyme Q-10 200 MG CAPS Take 200 mg by mouth daily.    ezetimibe (ZETIA) 10 MG tablet TAKE 1 TABLET BY MOUTH DAILY   fenofibrate 160  MG tablet TAKE 1 TABLET BY MOUTH DAILY.   glimepiride (AMARYL) 2 MG tablet Take 2 mg by mouth 2 (two) times daily.   isosorbide mononitrate (IMDUR) 30 MG 24 hr tablet TAKE 1 TABLET BY MOUTH DAILY.   losartan (COZAAR) 25 MG tablet Take 25 mg by mouth daily.   Melatonin 10 MG TBCR Take 10 mg by mouth at bedtime as needed (sleep).   metoprolol tartrate (LOPRESSOR) 25 MG tablet Take 0.5 tablets (12.5 mg total) by mouth 2 (two) times daily. Patient needs appointment for further refills. 1 st attempt   Multiple Vitamins-Minerals (CENTRUM SILVER PO) Take 1 tablet by mouth daily.   nitroGLYCERIN (NITROSTAT) 0.4 MG SL tablet Place 1 tablet (0.4 mg total) under the tongue every 5 (five) minutes as needed for chest pain.   Omega-3 Fatty Acids (FISH OIL TRIPLE STRENGTH) 1400 MG CAPS Take 1,400 mg by mouth daily.   pantoprazole (PROTONIX) 40 MG tablet Take 40 mg by mouth daily.    Pitavastatin Calcium (LIVALO) 4 MG TABS Take 1 tablet (4 mg total) by mouth daily.   ranolazine (RANEXA) 500 MG 12 hr tablet TAKE 1 TABLET BY MOUTH 2 TIMES DAILY.   tamsulosin (FLOMAX) 0.4 MG CAPS capsule Take 0.4 mg by mouth daily.   TRULICITY 1.5 MG/0.5ML SOPN Inject 1.5 mg into the skin every Sunday.   zolpidem (AMBIEN) 5 MG tablet Take 5 mg by mouth at bedtime as needed for sleep.     Allergies:   Crestor [rosuvastatin], Morphine, Ramipril, and Ticlopidine hcl   Social History   Socioeconomic History   Marital status: Married    Spouse name: Not on file   Number of children: Not on file   Years of education: Not on file   Highest education level: Not on file  Occupational History   Not on file  Tobacco Use   Smoking status: Former   Smokeless tobacco: Never  Vaping Use   Vaping Use: Never used  Substance and Sexual Activity   Alcohol use: No   Drug use: No   Sexual activity: Not on file  Other Topics Concern   Not on file  Social History Narrative   Not on file   Social Determinants of Health   Financial  Resource Strain: Not on file  Food Insecurity: Not on file  Transportation Needs: Not on file  Physical Activity: Not on file  Stress: Not on file  Social Connections: Not on file     Family History: The patient's family history includes Heart attack in his father; Heart disease in his mother; Heart failure in his mother; Stroke in his father.  ROS:   Please see the history of present illness.    All other systems reviewed and are negative.  EKGs/Labs/Other Studies Reviewed:    The following  studies were reviewed today: EKG reveals sinus rhythm first-degree AV block left ventricular hypertrophy inferior infarction of undetermined age and likely ischemia.   Recent Labs: 02/04/2022: BUN 26; Creatinine, Ser 1.73; Hemoglobin 13.3; Platelets 191; Potassium 4.7; Sodium 138  Recent Lipid Panel    Component Value Date/Time   CHOL 146 06/15/2021 1044   TRIG 108 06/15/2021 1044   HDL 51 06/15/2021 1044   CHOLHDL 2.9 06/15/2021 1044   CHOLHDL 5.3 09/08/2010 0415   VLDL UNABLE TO CALCULATE IF TRIGLYCERIDE OVER 400 mg/dL 40/98/1191 4782   LDLCALC 75 06/15/2021 1044    Physical Exam:    VS:  BP 130/68   Pulse 74   Ht 5\' 9"  (1.753 m)   Wt 191 lb 9.6 oz (86.9 kg)   SpO2 96%   BMI 28.29 kg/m     Wt Readings from Last 3 Encounters:  01/17/23 191 lb 9.6 oz (86.9 kg)  02/03/22 191 lb 12.8 oz (87 kg)  12/16/21 190 lb 3.2 oz (86.3 kg)     GEN: Patient is in no acute distress HEENT: Normal NECK: No JVD; No carotid bruits LYMPHATICS: No lymphadenopathy CARDIAC: Hear sounds regular, 2/6 systolic murmur at the apex. RESPIRATORY:  Clear to auscultation without rales, wheezing or rhonchi  ABDOMEN: Soft, non-tender, non-distended MUSCULOSKELETAL:  No edema; No deformity  SKIN: Warm and dry NEUROLOGIC:  Alert and oriented x 3 PSYCHIATRIC:  Normal affect   Signed, Garwin Brothers, MD  01/17/2023 2:36 PM    [facility]

## 2023-01-19 ENCOUNTER — Telehealth (HOSPITAL_COMMUNITY): Payer: Self-pay | Admitting: *Deleted

## 2023-01-19 NOTE — Telephone Encounter (Signed)
Left detailed message with instructions about upcoming MPI on 01/24/23 at 11:00 on answering machine pr DPR. Also asked to arrive 15 minutes early. Should he have questions he may call the [place] office.

## 2023-01-23 ENCOUNTER — Other Ambulatory Visit: Payer: Self-pay | Admitting: Cardiology

## 2023-01-24 ENCOUNTER — Ambulatory Visit: Payer: Medicare Other | Attending: Cardiology

## 2023-01-24 DIAGNOSIS — R0609 Other forms of dyspnea: Secondary | ICD-10-CM | POA: Diagnosis present

## 2023-01-24 LAB — MYOCARDIAL PERFUSION IMAGING
LV dias vol: 95 mL (ref 62–150)
LV sys vol: 36 mL
Nuc Stress EF: 63 %
Rest Nuclear Isotope Dose: 10.2 mCi
SDS: 8
SRS: 2
SSS: 10
ST Depression (mm): 0 mm
Stress Nuclear Isotope Dose: 31.4 mCi
TID: 1.22

## 2023-01-24 MED ORDER — TECHNETIUM TC 99M TETROFOSMIN IV KIT
31.4000 | PACK | Freq: Once | INTRAVENOUS | Status: AC | PRN
  Administered 2023-01-24: 31.4 via INTRAVENOUS

## 2023-01-24 MED ORDER — TECHNETIUM TC 99M TETROFOSMIN IV KIT
10.2000 | PACK | Freq: Once | INTRAVENOUS | Status: AC | PRN
  Administered 2023-01-24: 10.2 via INTRAVENOUS

## 2023-01-24 MED ORDER — REGADENOSON 0.4 MG/5ML IV SOLN
0.4000 mg | Freq: Once | INTRAVENOUS | Status: AC
Start: 2023-01-24 — End: 2023-01-24
  Administered 2023-01-24: 0.4 mg via INTRAVENOUS

## 2023-02-06 ENCOUNTER — Other Ambulatory Visit: Payer: Self-pay | Admitting: Cardiology

## 2023-02-10 ENCOUNTER — Ambulatory Visit: Payer: Medicare Other

## 2023-02-10 DIAGNOSIS — R011 Cardiac murmur, unspecified: Secondary | ICD-10-CM | POA: Diagnosis not present

## 2023-02-10 LAB — ECHOCARDIOGRAM COMPLETE
Area-P 1/2: 13.08 cm2
P 1/2 time: 475 msec
S' Lateral: 2.6 cm

## 2023-02-22 ENCOUNTER — Other Ambulatory Visit: Payer: Self-pay | Admitting: Cardiology

## 2023-02-27 ENCOUNTER — Other Ambulatory Visit: Payer: Self-pay | Admitting: Cardiology

## 2023-03-14 ENCOUNTER — Other Ambulatory Visit: Payer: Self-pay | Admitting: Cardiology

## 2023-03-15 ENCOUNTER — Other Ambulatory Visit: Payer: Self-pay | Admitting: Cardiology

## 2023-03-27 ENCOUNTER — Other Ambulatory Visit: Payer: Self-pay | Admitting: Cardiology

## 2023-05-12 ENCOUNTER — Encounter: Payer: Self-pay | Admitting: Cardiology

## 2023-05-12 DIAGNOSIS — E782 Mixed hyperlipidemia: Secondary | ICD-10-CM

## 2023-05-12 DIAGNOSIS — I25709 Atherosclerosis of coronary artery bypass graft(s), unspecified, with unspecified angina pectoris: Secondary | ICD-10-CM

## 2023-05-15 MED ORDER — ISOSORBIDE MONONITRATE ER 30 MG PO TB24: 30.0000 mg | ORAL_TABLET | Freq: Every day | ORAL | 2 refills | Status: DC

## 2023-05-15 MED ORDER — PITAVASTATIN CALCIUM 4 MG PO TABS: 1.0000 | ORAL_TABLET | Freq: Every day | ORAL | 2 refills | Status: DC

## 2023-05-15 MED ORDER — CLOPIDOGREL BISULFATE 75 MG PO TABS: 75.0000 mg | ORAL_TABLET | Freq: Every day | ORAL | 2 refills | Status: DC

## 2023-05-15 MED ORDER — RANOLAZINE ER 500 MG PO TB12: 500.0000 mg | ORAL_TABLET | Freq: Two times a day (BID) | ORAL | 2 refills | Status: DC

## 2023-05-15 MED ORDER — FENOFIBRATE 160 MG PO TABS: 160.0000 mg | ORAL_TABLET | Freq: Every day | ORAL | 2 refills | Status: DC

## 2023-05-15 MED ORDER — METOPROLOL TARTRATE 25 MG PO TABS: 12.5000 mg | ORAL_TABLET | Freq: Two times a day (BID) | ORAL | 2 refills | Status: DC

## 2023-05-15 MED ORDER — EZETIMIBE 10 MG PO TABS: 10.0000 mg | ORAL_TABLET | Freq: Every day | ORAL | 2 refills | Status: DC

## 2023-05-17 NOTE — Addendum Note (Signed)
Addended by: Eleonore Chiquito on: 05/17/2023 05:02 PM   Modules accepted: Orders

## 2023-05-24 DIAGNOSIS — E1169 Type 2 diabetes mellitus with other specified complication: Secondary | ICD-10-CM | POA: Diagnosis not present

## 2023-05-24 DIAGNOSIS — Z6828 Body mass index (BMI) 28.0-28.9, adult: Secondary | ICD-10-CM | POA: Diagnosis not present

## 2023-05-24 DIAGNOSIS — E669 Obesity, unspecified: Secondary | ICD-10-CM | POA: Diagnosis not present

## 2023-05-24 DIAGNOSIS — Z23 Encounter for immunization: Secondary | ICD-10-CM | POA: Diagnosis not present

## 2023-05-24 DIAGNOSIS — M810 Age-related osteoporosis without current pathological fracture: Secondary | ICD-10-CM | POA: Diagnosis not present

## 2023-05-24 LAB — LAB REPORT - SCANNED
A1c: 6.5
EGFR: 30

## 2023-06-13 ENCOUNTER — Encounter: Payer: Self-pay | Admitting: Cardiology

## 2023-06-13 DIAGNOSIS — I25709 Atherosclerosis of coronary artery bypass graft(s), unspecified, with unspecified angina pectoris: Secondary | ICD-10-CM

## 2023-06-13 DIAGNOSIS — E782 Mixed hyperlipidemia: Secondary | ICD-10-CM

## 2023-06-15 MED ORDER — PITAVASTATIN CALCIUM 2 MG PO TABS: 2.0000 mg | ORAL_TABLET | Freq: Every day | ORAL | 3 refills | Status: DC

## 2023-06-15 NOTE — Addendum Note (Signed)
Addended by: Eleonore Chiquito on: 06/15/2023 04:23 PM   Modules accepted: Orders

## 2023-06-16 MED ORDER — LIVALO 4 MG PO TABS: 2.0000 mg | ORAL_TABLET | Freq: Every day | ORAL | 0 refills | Status: DC

## 2023-06-16 MED ORDER — LIVALO 2 MG PO TABS: 2.0000 mg | ORAL_TABLET | Freq: Every day | ORAL | 1 refills | Status: DC

## 2023-06-16 NOTE — Addendum Note (Signed)
Addended by: Eleonore Chiquito on: 06/16/2023 11:51 AM   Modules accepted: Orders

## 2023-06-16 NOTE — Addendum Note (Signed)
Addended by: Eleonore Chiquito on: 06/16/2023 10:51 AM   Modules accepted: Orders

## 2023-08-29 DIAGNOSIS — E1169 Type 2 diabetes mellitus with other specified complication: Secondary | ICD-10-CM | POA: Diagnosis not present

## 2023-08-29 DIAGNOSIS — E669 Obesity, unspecified: Secondary | ICD-10-CM | POA: Diagnosis not present

## 2023-08-29 LAB — LAB REPORT - SCANNED
Calcium: 9.3
EGFR: 37

## 2023-09-13 DIAGNOSIS — R3912 Poor urinary stream: Secondary | ICD-10-CM | POA: Diagnosis not present

## 2023-09-13 DIAGNOSIS — N200 Calculus of kidney: Secondary | ICD-10-CM | POA: Diagnosis not present

## 2023-09-13 DIAGNOSIS — N401 Enlarged prostate with lower urinary tract symptoms: Secondary | ICD-10-CM | POA: Diagnosis not present

## 2023-12-06 DIAGNOSIS — E119 Type 2 diabetes mellitus without complications: Secondary | ICD-10-CM | POA: Diagnosis not present

## 2023-12-06 DIAGNOSIS — H5213 Myopia, bilateral: Secondary | ICD-10-CM | POA: Diagnosis not present

## 2023-12-07 DIAGNOSIS — L821 Other seborrheic keratosis: Secondary | ICD-10-CM | POA: Diagnosis not present

## 2023-12-07 DIAGNOSIS — D692 Other nonthrombocytopenic purpura: Secondary | ICD-10-CM | POA: Diagnosis not present

## 2023-12-07 DIAGNOSIS — L905 Scar conditions and fibrosis of skin: Secondary | ICD-10-CM | POA: Diagnosis not present

## 2023-12-07 DIAGNOSIS — L570 Actinic keratosis: Secondary | ICD-10-CM | POA: Diagnosis not present

## 2023-12-07 DIAGNOSIS — Z85828 Personal history of other malignant neoplasm of skin: Secondary | ICD-10-CM | POA: Diagnosis not present

## 2023-12-27 DIAGNOSIS — N183 Chronic kidney disease, stage 3 unspecified: Secondary | ICD-10-CM | POA: Diagnosis not present

## 2023-12-27 DIAGNOSIS — E782 Mixed hyperlipidemia: Secondary | ICD-10-CM | POA: Diagnosis not present

## 2023-12-27 DIAGNOSIS — E1122 Type 2 diabetes mellitus with diabetic chronic kidney disease: Secondary | ICD-10-CM | POA: Diagnosis not present

## 2023-12-27 DIAGNOSIS — Z0000 Encounter for general adult medical examination without abnormal findings: Secondary | ICD-10-CM | POA: Diagnosis not present

## 2024-01-05 DIAGNOSIS — E1122 Type 2 diabetes mellitus with diabetic chronic kidney disease: Secondary | ICD-10-CM | POA: Diagnosis not present

## 2024-01-05 DIAGNOSIS — N183 Chronic kidney disease, stage 3 unspecified: Secondary | ICD-10-CM | POA: Diagnosis not present

## 2024-01-05 DIAGNOSIS — E782 Mixed hyperlipidemia: Secondary | ICD-10-CM | POA: Diagnosis not present

## 2024-01-05 LAB — LAB REPORT - SCANNED
A1c: 6.6
EGFR: 37
TSH: 1.54 (ref 0.41–5.90)

## 2024-01-22 ENCOUNTER — Other Ambulatory Visit: Payer: Self-pay | Admitting: Cardiology

## 2024-02-05 ENCOUNTER — Other Ambulatory Visit: Payer: Self-pay | Admitting: Cardiology

## 2024-02-25 DIAGNOSIS — E1122 Type 2 diabetes mellitus with diabetic chronic kidney disease: Secondary | ICD-10-CM | POA: Diagnosis not present

## 2024-02-25 DIAGNOSIS — I129 Hypertensive chronic kidney disease with stage 1 through stage 4 chronic kidney disease, or unspecified chronic kidney disease: Secondary | ICD-10-CM | POA: Diagnosis not present

## 2024-02-25 DIAGNOSIS — I25119 Atherosclerotic heart disease of native coronary artery with unspecified angina pectoris: Secondary | ICD-10-CM | POA: Diagnosis not present

## 2024-02-25 DIAGNOSIS — J9601 Acute respiratory failure with hypoxia: Secondary | ICD-10-CM | POA: Diagnosis not present

## 2024-02-25 DIAGNOSIS — R9430 Abnormal result of cardiovascular function study, unspecified: Secondary | ICD-10-CM | POA: Diagnosis not present

## 2024-02-25 DIAGNOSIS — R9431 Abnormal electrocardiogram [ECG] [EKG]: Secondary | ICD-10-CM | POA: Diagnosis not present

## 2024-02-25 DIAGNOSIS — R0602 Shortness of breath: Secondary | ICD-10-CM | POA: Diagnosis not present

## 2024-02-25 DIAGNOSIS — N183 Chronic kidney disease, stage 3 unspecified: Secondary | ICD-10-CM | POA: Diagnosis not present

## 2024-02-25 DIAGNOSIS — R5383 Other fatigue: Secondary | ICD-10-CM | POA: Diagnosis not present

## 2024-02-25 DIAGNOSIS — I440 Atrioventricular block, first degree: Secondary | ICD-10-CM | POA: Diagnosis not present

## 2024-02-26 ENCOUNTER — Other Ambulatory Visit: Payer: Self-pay

## 2024-02-26 ENCOUNTER — Other Ambulatory Visit: Payer: Self-pay | Admitting: Cardiology

## 2024-02-26 ENCOUNTER — Inpatient Hospital Stay (HOSPITAL_COMMUNITY)
Admission: AD | Admit: 2024-02-26 | Discharge: 2024-02-28 | DRG: 287 | Disposition: A | Discharge status: Home / Self Care | Source: Other Acute Inpatient Hospital | Attending: Student in an Organized Health Care Education/Training Program | Admitting: Student in an Organized Health Care Education/Training Program

## 2024-02-26 ENCOUNTER — Encounter (HOSPITAL_COMMUNITY): Payer: Self-pay | Admitting: Cardiovascular Disease

## 2024-02-26 ENCOUNTER — Encounter (HOSPITAL_COMMUNITY): Payer: Self-pay

## 2024-02-26 DIAGNOSIS — K219 Gastro-esophageal reflux disease without esophagitis: Secondary | ICD-10-CM | POA: Diagnosis present

## 2024-02-26 DIAGNOSIS — I2489 Other forms of acute ischemic heart disease: Secondary | ICD-10-CM | POA: Diagnosis not present

## 2024-02-26 DIAGNOSIS — E119 Type 2 diabetes mellitus without complications: Secondary | ICD-10-CM | POA: Diagnosis present

## 2024-02-26 DIAGNOSIS — I25118 Atherosclerotic heart disease of native coronary artery with other forms of angina pectoris: Principal | ICD-10-CM | POA: Diagnosis present

## 2024-02-26 DIAGNOSIS — I440 Atrioventricular block, first degree: Secondary | ICD-10-CM | POA: Diagnosis not present

## 2024-02-26 DIAGNOSIS — I129 Hypertensive chronic kidney disease with stage 1 through stage 4 chronic kidney disease, or unspecified chronic kidney disease: Secondary | ICD-10-CM | POA: Diagnosis present

## 2024-02-26 DIAGNOSIS — I25708 Atherosclerosis of coronary artery bypass graft(s), unspecified, with other forms of angina pectoris: Principal | ICD-10-CM | POA: Diagnosis present

## 2024-02-26 DIAGNOSIS — Z794 Long term (current) use of insulin: Secondary | ICD-10-CM

## 2024-02-26 DIAGNOSIS — N183 Chronic kidney disease, stage 3 unspecified: Secondary | ICD-10-CM | POA: Diagnosis not present

## 2024-02-26 DIAGNOSIS — I361 Nonrheumatic tricuspid (valve) insufficiency: Secondary | ICD-10-CM | POA: Diagnosis not present

## 2024-02-26 DIAGNOSIS — Z888 Allergy status to other drugs, medicaments and biological substances status: Secondary | ICD-10-CM

## 2024-02-26 DIAGNOSIS — I2582 Chronic total occlusion of coronary artery: Secondary | ICD-10-CM | POA: Diagnosis present

## 2024-02-26 DIAGNOSIS — R0609 Other forms of dyspnea: Secondary | ICD-10-CM | POA: Diagnosis not present

## 2024-02-26 DIAGNOSIS — Z7902 Long term (current) use of antithrombotics/antiplatelets: Secondary | ICD-10-CM | POA: Diagnosis not present

## 2024-02-26 DIAGNOSIS — E7800 Pure hypercholesterolemia, unspecified: Secondary | ICD-10-CM | POA: Diagnosis not present

## 2024-02-26 DIAGNOSIS — R9430 Abnormal result of cardiovascular function study, unspecified: Secondary | ICD-10-CM | POA: Diagnosis not present

## 2024-02-26 DIAGNOSIS — R509 Fever, unspecified: Secondary | ICD-10-CM | POA: Diagnosis not present

## 2024-02-26 DIAGNOSIS — Z79899 Other long term (current) drug therapy: Secondary | ICD-10-CM

## 2024-02-26 DIAGNOSIS — N1832 Chronic kidney disease, stage 3b: Secondary | ICD-10-CM | POA: Diagnosis present

## 2024-02-26 DIAGNOSIS — Z951 Presence of aortocoronary bypass graft: Secondary | ICD-10-CM | POA: Diagnosis not present

## 2024-02-26 DIAGNOSIS — E782 Mixed hyperlipidemia: Secondary | ICD-10-CM | POA: Diagnosis present

## 2024-02-26 DIAGNOSIS — I2510 Atherosclerotic heart disease of native coronary artery without angina pectoris: Secondary | ICD-10-CM | POA: Diagnosis not present

## 2024-02-26 DIAGNOSIS — I25810 Atherosclerosis of coronary artery bypass graft(s) without angina pectoris: Secondary | ICD-10-CM | POA: Diagnosis not present

## 2024-02-26 DIAGNOSIS — R0902 Hypoxemia: Secondary | ICD-10-CM | POA: Diagnosis present

## 2024-02-26 DIAGNOSIS — R079 Chest pain, unspecified: Principal | ICD-10-CM | POA: Diagnosis present

## 2024-02-26 DIAGNOSIS — Z7985 Long-term (current) use of injectable non-insulin antidiabetic drugs: Secondary | ICD-10-CM

## 2024-02-26 DIAGNOSIS — Z87891 Personal history of nicotine dependence: Secondary | ICD-10-CM | POA: Diagnosis not present

## 2024-02-26 DIAGNOSIS — Z1152 Encounter for screening for COVID-19: Secondary | ICD-10-CM | POA: Diagnosis not present

## 2024-02-26 DIAGNOSIS — I25119 Atherosclerotic heart disease of native coronary artery with unspecified angina pectoris: Secondary | ICD-10-CM | POA: Diagnosis not present

## 2024-02-26 DIAGNOSIS — Z7984 Long term (current) use of oral hypoglycemic drugs: Secondary | ICD-10-CM

## 2024-02-26 DIAGNOSIS — Z87442 Personal history of urinary calculi: Secondary | ICD-10-CM | POA: Diagnosis not present

## 2024-02-26 DIAGNOSIS — E1122 Type 2 diabetes mellitus with diabetic chronic kidney disease: Secondary | ICD-10-CM | POA: Diagnosis not present

## 2024-02-26 DIAGNOSIS — R0789 Other chest pain: Secondary | ICD-10-CM | POA: Diagnosis not present

## 2024-02-26 DIAGNOSIS — R0602 Shortness of breath: Secondary | ICD-10-CM | POA: Diagnosis not present

## 2024-02-26 DIAGNOSIS — R0600 Dyspnea, unspecified: Secondary | ICD-10-CM | POA: Diagnosis not present

## 2024-02-26 DIAGNOSIS — Z955 Presence of coronary angioplasty implant and graft: Secondary | ICD-10-CM

## 2024-02-26 DIAGNOSIS — Z7982 Long term (current) use of aspirin: Secondary | ICD-10-CM

## 2024-02-26 DIAGNOSIS — Z8249 Family history of ischemic heart disease and other diseases of the circulatory system: Secondary | ICD-10-CM

## 2024-02-26 DIAGNOSIS — R9431 Abnormal electrocardiogram [ECG] [EKG]: Secondary | ICD-10-CM | POA: Diagnosis not present

## 2024-02-26 DIAGNOSIS — I351 Nonrheumatic aortic (valve) insufficiency: Secondary | ICD-10-CM | POA: Diagnosis not present

## 2024-02-26 DIAGNOSIS — Z885 Allergy status to narcotic agent status: Secondary | ICD-10-CM | POA: Diagnosis not present

## 2024-02-26 DIAGNOSIS — I10 Essential (primary) hypertension: Secondary | ICD-10-CM | POA: Diagnosis present

## 2024-02-26 DIAGNOSIS — I252 Old myocardial infarction: Secondary | ICD-10-CM | POA: Diagnosis not present

## 2024-02-26 DIAGNOSIS — Z823 Family history of stroke: Secondary | ICD-10-CM

## 2024-02-26 DIAGNOSIS — I2089 Other forms of angina pectoris: Principal | ICD-10-CM

## 2024-02-26 DIAGNOSIS — J9601 Acute respiratory failure with hypoxia: Secondary | ICD-10-CM | POA: Diagnosis not present

## 2024-02-26 DIAGNOSIS — R9439 Abnormal result of other cardiovascular function study: Secondary | ICD-10-CM

## 2024-02-26 DIAGNOSIS — N400 Enlarged prostate without lower urinary tract symptoms: Secondary | ICD-10-CM | POA: Diagnosis present

## 2024-02-26 LAB — GLUCOSE, CAPILLARY: Glucose-Capillary: 183 mg/dL — ABNORMAL HIGH (ref 70–99)

## 2024-02-27 ENCOUNTER — Encounter (HOSPITAL_COMMUNITY)
Admission: AD | Disposition: A | Discharge status: Home / Self Care | Payer: Self-pay | Source: Other Acute Inpatient Hospital | Attending: Student in an Organized Health Care Education/Training Program

## 2024-02-27 ENCOUNTER — Inpatient Hospital Stay (HOSPITAL_COMMUNITY)

## 2024-02-27 ENCOUNTER — Encounter (HOSPITAL_COMMUNITY): Payer: Self-pay | Admitting: Student in an Organized Health Care Education/Training Program

## 2024-02-27 DIAGNOSIS — I2489 Other forms of acute ischemic heart disease: Secondary | ICD-10-CM

## 2024-02-27 DIAGNOSIS — R9439 Abnormal result of other cardiovascular function study: Secondary | ICD-10-CM

## 2024-02-27 DIAGNOSIS — R0609 Other forms of dyspnea: Secondary | ICD-10-CM

## 2024-02-27 DIAGNOSIS — I2510 Atherosclerotic heart disease of native coronary artery without angina pectoris: Secondary | ICD-10-CM | POA: Diagnosis not present

## 2024-02-27 DIAGNOSIS — R079 Chest pain, unspecified: Secondary | ICD-10-CM

## 2024-02-27 DIAGNOSIS — I25810 Atherosclerosis of coronary artery bypass graft(s) without angina pectoris: Secondary | ICD-10-CM | POA: Diagnosis not present

## 2024-02-27 DIAGNOSIS — I2089 Other forms of angina pectoris: Secondary | ICD-10-CM | POA: Diagnosis not present

## 2024-02-27 HISTORY — DX: Other forms of dyspnea: R06.09

## 2024-02-27 HISTORY — DX: Chest pain, unspecified: R07.9

## 2024-02-27 HISTORY — DX: Abnormal result of other cardiovascular function study: R94.39

## 2024-02-27 HISTORY — PX: RIGHT/LEFT HEART CATH AND CORONARY/GRAFT ANGIOGRAPHY: CATH118267

## 2024-02-27 LAB — ECHOCARDIOGRAM COMPLETE
Area-P 1/2: 3.07 cm2
Calc EF: 64.8 %
Height: 68 in
S' Lateral: 2.91 cm
Single Plane A2C EF: 64.7 %
Single Plane A4C EF: 66.4 %
Weight: 2956.8 [oz_av]

## 2024-02-27 LAB — POCT I-STAT EG7
Acid-base deficit: 4 mmol/L — ABNORMAL HIGH (ref 0.0–2.0)
Bicarbonate: 21.1 mmol/L (ref 20.0–28.0)
Calcium, Ion: 1.24 mmol/L (ref 1.15–1.40)
HCT: 38 % — ABNORMAL LOW (ref 39.0–52.0)
Hemoglobin: 12.9 g/dL — ABNORMAL LOW (ref 13.0–17.0)
O2 Saturation: 56 %
Potassium: 4.2 mmol/L (ref 3.5–5.1)
Sodium: 142 mmol/L (ref 135–145)
TCO2: 22 mmol/L (ref 22–32)
pCO2, Ven: 39.5 mmHg — ABNORMAL LOW (ref 44–60)
pH, Ven: 7.336 (ref 7.25–7.43)
pO2, Ven: 31 mmHg — CL (ref 32–45)

## 2024-02-27 LAB — BASIC METABOLIC PANEL WITH GFR
Anion gap: 7 (ref 5–15)
BUN: 25 mg/dL — ABNORMAL HIGH (ref 8–23)
CO2: 22 mmol/L (ref 22–32)
Calcium: 9.1 mg/dL (ref 8.9–10.3)
Chloride: 110 mmol/L (ref 98–111)
Creatinine, Ser: 2.05 mg/dL — ABNORMAL HIGH (ref 0.61–1.24)
GFR, Estimated: 32 mL/min — ABNORMAL LOW (ref >60)
Glucose, Bld: 227 mg/dL — ABNORMAL HIGH (ref 70–99)
Potassium: 4.7 mmol/L (ref 3.5–5.1)
Sodium: 139 mmol/L (ref 135–145)

## 2024-02-27 LAB — GLUCOSE, CAPILLARY
Glucose-Capillary: 106 mg/dL — ABNORMAL HIGH (ref 70–99)
Glucose-Capillary: 155 mg/dL — ABNORMAL HIGH (ref 70–99)

## 2024-02-27 LAB — POCT I-STAT 7, (LYTES, BLD GAS, ICA,H+H)
Acid-base deficit: 5 mmol/L — ABNORMAL HIGH (ref 0.0–2.0)
Bicarbonate: 19.8 mmol/L — ABNORMAL LOW (ref 20.0–28.0)
Calcium, Ion: 1.24 mmol/L (ref 1.15–1.40)
HCT: 37 % — ABNORMAL LOW (ref 39.0–52.0)
Hemoglobin: 12.6 g/dL — ABNORMAL LOW (ref 13.0–17.0)
O2 Saturation: 92 %
Potassium: 4.2 mmol/L (ref 3.5–5.1)
Sodium: 141 mmol/L (ref 135–145)
TCO2: 21 mmol/L — ABNORMAL LOW (ref 22–32)
pCO2 arterial: 36.1 mmHg (ref 32–48)
pH, Arterial: 7.346 — ABNORMAL LOW (ref 7.35–7.45)
pO2, Arterial: 68 mmHg — ABNORMAL LOW (ref 83–108)

## 2024-02-27 LAB — LIPID PANEL
Cholesterol: 122 mg/dL (ref 0–200)
HDL: 39 mg/dL — ABNORMAL LOW (ref >40)
LDL Cholesterol: 54 mg/dL (ref 0–99)
Total CHOL/HDL Ratio: 3.1 ratio
Triglycerides: 147 mg/dL (ref <150)
VLDL: 29 mg/dL (ref 0–40)

## 2024-02-27 LAB — HEMOGLOBIN A1C
Hgb A1c MFr Bld: 6.7 % — ABNORMAL HIGH (ref 4.8–5.6)
Mean Plasma Glucose: 145.59 mg/dL

## 2024-02-27 LAB — CBC
HCT: 42.2 % (ref 39.0–52.0)
Hemoglobin: 13.7 g/dL (ref 13.0–17.0)
MCH: 29.7 pg (ref 26.0–34.0)
MCHC: 32.5 g/dL (ref 30.0–36.0)
MCV: 91.3 fL (ref 80.0–100.0)
Platelets: 182 K/uL (ref 150–400)
RBC: 4.62 MIL/uL (ref 4.22–5.81)
RDW: 14.2 % (ref 11.5–15.5)
WBC: 9.3 K/uL (ref 4.0–10.5)
nRBC: 0 % (ref 0.0–0.2)

## 2024-02-27 LAB — TSH: TSH: 1.126 u[IU]/mL (ref 0.350–4.500)

## 2024-02-27 SURGERY — RIGHT/LEFT HEART CATH AND CORONARY/GRAFT ANGIOGRAPHY
Anesthesia: LOCAL

## 2024-02-27 MED ORDER — MIDAZOLAM HCL 2 MG/2ML IJ SOLN
INTRAMUSCULAR | Status: AC
  Filled 2024-02-27: qty 2

## 2024-02-27 MED ORDER — IOHEXOL 350 MG/ML SOLN
INTRAVENOUS | Status: DC | PRN
  Administered 2024-02-27: 25 mL via INTRA_ARTERIAL

## 2024-02-27 MED ORDER — EZETIMIBE 10 MG PO TABS
10.0000 mg | ORAL_TABLET | Freq: Every day | ORAL | Status: DC
  Administered 2024-02-27 – 2024-02-28 (×2): 10 mg via ORAL
  Filled 2024-02-27 (×2): qty 1

## 2024-02-27 MED ORDER — ASPIRIN 81 MG PO TBEC: 81.0000 mg | DELAYED_RELEASE_TABLET | Freq: Every day | ORAL | Status: DC

## 2024-02-27 MED ORDER — NITROGLYCERIN 0.4 MG SL SUBL: 0.4000 mg | SUBLINGUAL_TABLET | SUBLINGUAL | Status: DC | PRN

## 2024-02-27 MED ORDER — HEPARIN (PORCINE) IN NACL 1000-0.9 UT/500ML-% IV SOLN
INTRAVENOUS | Status: DC | PRN
  Administered 2024-02-27 (×2): 500 mL

## 2024-02-27 MED ORDER — LABETALOL HCL 5 MG/ML IV SOLN: 10.0000 mg | INTRAVENOUS | Status: AC | PRN

## 2024-02-27 MED ORDER — LIDOCAINE HCL (PF) 1 % IJ SOLN
INTRAMUSCULAR | Status: AC
  Filled 2024-02-27: qty 30

## 2024-02-27 MED ORDER — ZOLPIDEM TARTRATE 5 MG PO TABS: 5.0000 mg | ORAL_TABLET | Freq: Every evening | ORAL | Status: DC | PRN

## 2024-02-27 MED ORDER — ONDANSETRON HCL 4 MG/2ML IJ SOLN: 4.0000 mg | Freq: Four times a day (QID) | INTRAMUSCULAR | Status: DC | PRN

## 2024-02-27 MED ORDER — HEPARIN SODIUM (PORCINE) 5000 UNIT/ML IJ SOLN
5000.0000 [IU] | Freq: Three times a day (TID) | INTRAMUSCULAR | Status: DC
  Administered 2024-02-27 – 2024-02-28 (×3): 5000 [IU] via SUBCUTANEOUS
  Filled 2024-02-27 (×3): qty 1

## 2024-02-27 MED ORDER — VERAPAMIL HCL 2.5 MG/ML IV SOLN
INTRAVENOUS | Status: AC
  Filled 2024-02-27: qty 2

## 2024-02-27 MED ORDER — SODIUM CHLORIDE 0.9 % WEIGHT BASED INFUSION
3.0000 mL/kg/h | INTRAVENOUS | Status: DC
  Administered 2024-02-27: 3 mL/kg/h via INTRAVENOUS

## 2024-02-27 MED ORDER — HEPARIN SODIUM (PORCINE) 1000 UNIT/ML IJ SOLN
INTRAMUSCULAR | Status: DC | PRN
  Administered 2024-02-27: 5000 [IU] via INTRAVENOUS

## 2024-02-27 MED ORDER — VERAPAMIL HCL 2.5 MG/ML IV SOLN
INTRAVENOUS | Status: DC | PRN
  Administered 2024-02-27: 10 mL via INTRA_ARTERIAL

## 2024-02-27 MED ORDER — FREE WATER
250.0000 mL | Freq: Once | Status: AC
  Administered 2024-02-27: 250 mL via ORAL

## 2024-02-27 MED ORDER — HEPARIN SODIUM (PORCINE) 1000 UNIT/ML IJ SOLN
INTRAMUSCULAR | Status: AC
Start: 2024-02-27 — End: 2024-02-27
  Filled 2024-02-27: qty 10

## 2024-02-27 MED ORDER — MIDAZOLAM HCL 2 MG/2ML IJ SOLN
INTRAMUSCULAR | Status: DC | PRN
Start: 2024-02-27 — End: 2024-02-27
  Administered 2024-02-27: 2 mg via INTRAVENOUS

## 2024-02-27 MED ORDER — FENTANYL CITRATE (PF) 100 MCG/2ML IJ SOLN
INTRAMUSCULAR | Status: AC
  Filled 2024-02-27: qty 2

## 2024-02-27 MED ORDER — SODIUM CHLORIDE 0.9 % IV SOLN: 250.0000 mL | INTRAVENOUS | Status: DC | PRN

## 2024-02-27 MED ORDER — TAMSULOSIN HCL 0.4 MG PO CAPS
0.4000 mg | ORAL_CAPSULE | Freq: Every day | ORAL | Status: DC
Start: 2024-02-27 — End: 2024-02-28
  Administered 2024-02-27 – 2024-02-28 (×2): 0.4 mg via ORAL
  Filled 2024-02-27 (×2): qty 1

## 2024-02-27 MED ORDER — SODIUM CHLORIDE 0.9% FLUSH
3.0000 mL | Freq: Two times a day (BID) | INTRAVENOUS | Status: DC
  Administered 2024-02-28: 3 mL via INTRAVENOUS

## 2024-02-27 MED ORDER — ASPIRIN 81 MG PO CHEW: 81.0000 mg | CHEWABLE_TABLET | ORAL | Status: DC

## 2024-02-27 MED ORDER — CLOPIDOGREL BISULFATE 75 MG PO TABS
75.0000 mg | ORAL_TABLET | Freq: Every day | ORAL | Status: DC
  Administered 2024-02-28: 75 mg via ORAL
  Filled 2024-02-27: qty 1

## 2024-02-27 MED ORDER — ATORVASTATIN CALCIUM 40 MG PO TABS: 40.0000 mg | ORAL_TABLET | Freq: Every day | ORAL | Status: DC

## 2024-02-27 MED ORDER — ISOSORBIDE MONONITRATE ER 30 MG PO TB24
30.0000 mg | ORAL_TABLET | Freq: Every day | ORAL | Status: DC
  Administered 2024-02-27 – 2024-02-28 (×2): 30 mg via ORAL
  Filled 2024-02-27 (×2): qty 1

## 2024-02-27 MED ORDER — HYDRALAZINE HCL 20 MG/ML IJ SOLN: 10.0000 mg | INTRAMUSCULAR | Status: AC | PRN

## 2024-02-27 MED ORDER — SODIUM CHLORIDE 0.9% FLUSH: 3.0000 mL | INTRAVENOUS | Status: DC | PRN

## 2024-02-27 MED ORDER — METOPROLOL TARTRATE 12.5 MG HALF TABLET
12.5000 mg | ORAL_TABLET | Freq: Two times a day (BID) | ORAL | Status: DC
  Administered 2024-02-27 – 2024-02-28 (×4): 12.5 mg via ORAL
  Filled 2024-02-27 (×4): qty 1

## 2024-02-27 MED ORDER — INSULIN ASPART 100 UNIT/ML IJ SOLN
0.0000 [IU] | Freq: Three times a day (TID) | INTRAMUSCULAR | Status: DC
  Administered 2024-02-28: 5 [IU] via SUBCUTANEOUS

## 2024-02-27 MED ORDER — PANTOPRAZOLE SODIUM 40 MG PO TBEC
40.0000 mg | DELAYED_RELEASE_TABLET | Freq: Every day | ORAL | Status: DC
  Administered 2024-02-27 – 2024-02-28 (×2): 40 mg via ORAL
  Filled 2024-02-27 (×2): qty 1

## 2024-02-27 MED ORDER — ACETAMINOPHEN 325 MG PO TABS
650.0000 mg | ORAL_TABLET | ORAL | Status: DC | PRN
  Administered 2024-02-27 – 2024-02-28 (×2): 650 mg via ORAL
  Filled 2024-02-27 (×2): qty 2

## 2024-02-27 MED ORDER — SODIUM CHLORIDE 0.9 % IV SOLN: INTRAVENOUS | Status: AC

## 2024-02-27 MED ORDER — RANOLAZINE ER 500 MG PO TB12
500.0000 mg | ORAL_TABLET | Freq: Two times a day (BID) | ORAL | Status: DC
  Administered 2024-02-27 – 2024-02-28 (×4): 500 mg via ORAL
  Filled 2024-02-27 (×4): qty 1

## 2024-02-27 MED ORDER — FENTANYL CITRATE (PF) 100 MCG/2ML IJ SOLN
INTRAMUSCULAR | Status: DC | PRN
  Administered 2024-02-27: 25 ug via INTRAVENOUS

## 2024-02-27 MED ORDER — SODIUM CHLORIDE 0.9 % WEIGHT BASED INFUSION: 1.0000 mL/kg/h | INTRAVENOUS | Status: DC

## 2024-02-27 MED ORDER — LIDOCAINE HCL (PF) 1 % IJ SOLN
INTRAMUSCULAR | Status: DC | PRN
  Administered 2024-02-27: 2 mL

## 2024-02-27 SURGICAL SUPPLY — 12 items
CATH BALLN WEDGE 5F 110CM (CATHETERS) IMPLANT
CATH EXPO 5F MPA-1 (CATHETERS) IMPLANT
CATH INFINITI 5FR AL1 (CATHETERS) IMPLANT
CATH INFINITI 5FR MULTPACK ANG (CATHETERS) IMPLANT
DEVICE RAD COMP TR BAND LRG (VASCULAR PRODUCTS) IMPLANT
ELECT DEFIB PAD ADLT CADENCE (PAD) IMPLANT
GLIDESHEATH SLEND SS 6F .021 (SHEATH) IMPLANT
GUIDEWIRE INQWIRE 1.5J.035X260 (WIRE) IMPLANT
KIT SINGLE USE MANIFOLD (KITS) IMPLANT
PACK CARDIAC CATHETERIZATION (CUSTOM PROCEDURE TRAY) ×1 IMPLANT
SET ATX-X65L (MISCELLANEOUS) IMPLANT
SHEATH GLIDE SLENDER 4/5FR (SHEATH) IMPLANT

## 2024-02-27 NOTE — Progress Notes (Addendum)
 Rounding Note   Patient Name: Glenn Roach Date of Encounter: 02/27/2024  [facility] Cardiologist: Jennifer JONELLE Crape, MD   Subjective Doing well now. Shared that he has had chronic SOB which is why Dr. Crape obtained an echocardiogram and Lexiscan  which were normal. The last week though he noticed worsening shortness of breath especially the last few days with extreme fatigue. He shared his previous ischemic events were preceded by either chest pain or extreme sudden fatigue.   Scheduled Meds:  [START ON 02/28/2024] aspirin  EC  81 mg Oral Daily   [START ON 02/28/2024] clopidogrel   75 mg Oral Daily   ezetimibe   10 mg Oral Daily   isosorbide  mononitrate  30 mg Oral Daily   metoprolol  tartrate  12.5 mg Oral BID   pantoprazole   40 mg Oral Daily   ranolazine   500 mg Oral BID   tamsulosin   0.4 mg Oral Daily   Continuous Infusions:  PRN Meds: acetaminophen , nitroGLYCERIN , ondansetron  (ZOFRAN ) IV, zolpidem    Vital Signs  Vitals:   02/26/24 2351 02/27/24 0055 02/27/24 0420 02/27/24 0823  BP: (!) 153/71 (!) 113/57 (!) 128/58 (!) 161/68  Pulse: 92 78 69 67  Resp: 18  20 (!) 22  Temp: (!) 97.4 F (36.3 C)  99 F (37.2 C) 98.9 F (37.2 C)  TempSrc: Oral  Oral Oral  SpO2: 99%  96% 93%  Weight:      Height:       No intake or output data in the 24 hours ending 02/27/24 0951    02/26/2024    9:18 PM 01/24/2023   10:54 AM 01/17/2023    2:02 PM  Last 3 Weights  Weight (lbs) 184 lb 12.8 oz 191 lb 191 lb 9.6 oz  Weight (kg) 83.825 kg 86.637 kg 86.909 kg      Telemetry Sinus rhythm HR 70 - Personally Reviewed  Physical Exam GEN: No acute distress.   Cardiac: RRR  Respiratory: Breathing comfortably on room air MS: No edema; No deformity. Neuro:  Nonfocal  Psych: Normal affect   Labs High Sensitivity Troponin:  No results for input(s): TROPONINIHS in the last 720 hours.   Chemistry Recent Labs  Lab 02/27/24 0506  NA 139  K 4.7  CL 110  CO2 22  GLUCOSE  227*  BUN 25*  CREATININE 2.05*  CALCIUM  9.1  GFRNONAA 32*  ANIONGAP 7    Lipids  Recent Labs  Lab 02/27/24 0506  CHOL 122  TRIG 147  HDL 39*  LDLCALC 54  CHOLHDL 3.1    Hematology Recent Labs  Lab 02/27/24 0506  WBC 9.3  RBC 4.62  HGB 13.7  HCT 42.2  MCV 91.3  MCH 29.7  MCHC 32.5  RDW 14.2  PLT 182   Thyroid   Recent Labs  Lab 02/27/24 0506  TSH 1.126    BNPNo results for input(s): BNP, PROBNP in the last 168 hours.  DDimer No results for input(s): DDIMER in the last 168 hours.   Radiology  No results found.  Cardiac Studies Echocardiogram 01/2023 IMPRESSIONS     1. Very limited study. Left ventricular ejection fraction, by estimation,  is 60 to 65%. The left ventricle has normal function. Left ventricular  endocardial border not optimally defined to evaluate regional wall motion.  There is moderate left  ventricular hypertrophy. Left ventricular diastolic parameters are  indeterminate. The average left ventricular global longitudinal strain is  -11.7 %. The global longitudinal strain is abnormal.   2. Right ventricular  systolic function is normal. The right ventricular  size is normal.   3. The mitral valve is normal in structure. No evidence of mitral valve  regurgitation. No evidence of mitral stenosis.   4. The aortic valve was not well visualized. Aortic valve regurgitation  is mild. No aortic stenosis is present.   5. The inferior vena cava is normal in size with greater than 50%  respiratory variability, suggesting right atrial pressure of 3 mmHg.   Lexiscan  01/2023   The study is normal. The study is low risk.   No ST deviation was noted.   Left ventricular function is normal. Nuclear stress EF: 63%. The left ventricular ejection fraction is normal (55-65%). End diastolic cavity size is normal.   Prior study not available for comparison.  Do not have the official report from [facility] for echocardiogram or Lexiscan  preformed  there  Patient Profile   78 y.o. male with a past medical history of CAD s/p distant prior RCA PCI (1994), distant prior LAD PCI (1996 after tx with t-PA for anterior STEMI), repeat PCI of p/mRCA (1998) repeat LAD+RCA PCI (2002) for ISR with atherectomy, repeat PCI of the p/mRCA for ISR (01/19/03), RCA PCI (2010), LAD ISR/CTO and ISR of the RCA (09/09/10), with subsequent CABG, hypertension, hyperlipidemia, CKD and T2DM who had presented to [place] on 8/17 for progressive shortness of breath and fatigue. In the ED he was hypertensive, pro BNP (1600), negative troponins, and CTA/CXR unremarkable. He did develop hypoxia (88% on RA) so he was admitted for acute respiratory failure. Echo showed LVEF 55-60% with no RWMA. He developed a fever (102.6 F) and received ceftriaxone. Cardiology was consulted and recommended a Lexiscan  which was abnormal, prompting his transfer to [facility] for further evaluation.   Assessment & Plan  CAD s/p multiple PCI s/p CABG in 2012 [ LIMA to LAD, Vein to posterolateral branch off distal RCA, Vein to 2nd Diag] Abnormal Lexiscan   CKD baseline 1.8-2.2 Progressive dyspnea on exertion. Historically his ischemic events have presented with chest pain and extreme onset of fatigue.  Patient had a normal Lexiscan  01/2023. A repeat scan was obtained due to new unexplained symptoms, and Lexiscan  02/2024 showed at least partially reversible myocardial perfusion defect at the inferior wall noted from mid to base consistent with inducible myocardial ischemia.  ECG without ischemic changes and troponin negative.  Echo showed LVEF 55-60% with no RWMA. Cr at baseline for patient.   Given new onset of symptoms and abnormal Lexiscan  will pursue cardiac catheterization today. Will also obtain RHC as patient has been dealing with chronic baseline SOB and his pro-BNP was elevated at [facility]. Patient to remain NPO until after the procedure Echocardiogram pending as we cannot access the images and  formal report at [facility]  Continue Asa 81 mg Continue plavix  75 mg  Continue lopressor  12.5 mg BID Continue imdur  30mg  Continue Ranexa  500 mg BID  Acute Hypoxia SpO2 88% on RA at [facility] Patient resting comfortably on RA with SpO2 93% Continue to monitor  Obtained RHC today. See above.   Hypertension BP: 161/68 Continue lopressor  12.5 mg BID Continue imdur  30 mg Continue to hold losartan  until after cath  Hyperlipidemia LDL 54   HDL 39 Lipoprotein (a) pending Patient is on Livalo  OP, non-formulary will resume at discharge Continue zetia  10 mg   Episode of fever  At [facility] patient had one instance of elevated temperature (102). He was given one dose of ceftriaxone. Reported no systemic illness symptoms. No leukocytosis noted on  CBC. CXR unremarkable. Will continue to monitor.   T2DM A1c 6.7% Hold PTA Trulicity until discharge   Glucose readings have been elevated (223 fasting) will add SSI after cath once patient is no longer NPO  BPH Continue flomax  0.4 mg  GERD  Continue protonix  40 mg  Code status: full Dvt Ppx: heparin  subq   For questions or updates, please contact [facility] Please consult [email or web address] for contact info under     Signed, Leontine LOISE Salen, PA-C  02/27/2024, 9:51 AM    Agree with note by Leontine Salen, PA-C  Patient of Dr. Edwyna in [place].  He is a retired [employer].  He was transferred from [facility] after presenting with sudden onset of progressive dyspnea over the last several days of unclear etiology.  He has had bypass surgery 13 years ago by Dr. Dusty  with a LIMA to his LAD, vein to the second diagonal branch and the posterolateral branch of the RCA.  His enzymes were end of hospital were negative.  His BNP was mildly elevated.  Chest x-ray showed no active disease.  2D echo showed preserved LV function.  Myoview  stress test showed inferior ischemia new since his prior stress test a year ago.  He does have  baseline CKD with a serum creatinine in the 2 range which is his baseline.  His exam is benign today.  Plan right heart cath, left heart cath and grafts today as an add-on.  I have reviewed the risks, indications, and alternatives to cardiac catheterization, possible angioplasty, and stenting with the patient. Risks include but are not limited to bleeding, infection, vascular injury, stroke, myocardial infection, arrhythmia, kidney injury, radiation-related injury in the case of prolonged fluoroscopy use, emergency cardiac surgery, and death. The patient understands the risks of serious complication is 1-2 in 1000 with diagnostic cardiac cath and 1-2% or less with angioplasty/stenting.    Dorn DOROTHA Lesches, M.D., FACP, [facility], FAHA, [facility]  [address] [place], [place]  [address]  [phone] 02/27/2024 10:50 AM

## 2024-02-27 NOTE — H&P (View-Only) (Signed)
 Rounding Note   Patient Name: Glenn Roach Date of Encounter: 02/27/2024  [facility] Cardiologist: Jennifer JONELLE Crape, MD   Subjective Doing well now. Shared that he has had chronic SOB which is why Dr. Crape obtained an echocardiogram and Lexiscan  which were normal. The last week though he noticed worsening shortness of breath especially the last few days with extreme fatigue. He shared his previous ischemic events were preceded by either chest pain or extreme sudden fatigue.   Scheduled Meds:  [START ON 02/28/2024] aspirin  EC  81 mg Oral Daily   [START ON 02/28/2024] clopidogrel   75 mg Oral Daily   ezetimibe   10 mg Oral Daily   isosorbide  mononitrate  30 mg Oral Daily   metoprolol  tartrate  12.5 mg Oral BID   pantoprazole   40 mg Oral Daily   ranolazine   500 mg Oral BID   tamsulosin   0.4 mg Oral Daily   Continuous Infusions:  PRN Meds: acetaminophen , nitroGLYCERIN , ondansetron  (ZOFRAN ) IV, zolpidem    Vital Signs  Vitals:   02/26/24 2351 02/27/24 0055 02/27/24 0420 02/27/24 0823  BP: (!) 153/71 (!) 113/57 (!) 128/58 (!) 161/68  Pulse: 92 78 69 67  Resp: 18  20 (!) 22  Temp: (!) 97.4 F (36.3 C)  99 F (37.2 C) 98.9 F (37.2 C)  TempSrc: Oral  Oral Oral  SpO2: 99%  96% 93%  Weight:      Height:       No intake or output data in the 24 hours ending 02/27/24 0951    02/26/2024    9:18 PM 01/24/2023   10:54 AM 01/17/2023    2:02 PM  Last 3 Weights  Weight (lbs) 184 lb 12.8 oz 191 lb 191 lb 9.6 oz  Weight (kg) 83.825 kg 86.637 kg 86.909 kg      Telemetry Sinus rhythm HR 70 - Personally Reviewed  Physical Exam GEN: No acute distress.   Cardiac: RRR  Respiratory: Breathing comfortably on room air MS: No edema; No deformity. Neuro:  Nonfocal  Psych: Normal affect   Labs High Sensitivity Troponin:  No results for input(s): TROPONINIHS in the last 720 hours.   Chemistry Recent Labs  Lab 02/27/24 0506  NA 139  K 4.7  CL 110  CO2 22  GLUCOSE  227*  BUN 25*  CREATININE 2.05*  CALCIUM  9.1  GFRNONAA 32*  ANIONGAP 7    Lipids  Recent Labs  Lab 02/27/24 0506  CHOL 122  TRIG 147  HDL 39*  LDLCALC 54  CHOLHDL 3.1    Hematology Recent Labs  Lab 02/27/24 0506  WBC 9.3  RBC 4.62  HGB 13.7  HCT 42.2  MCV 91.3  MCH 29.7  MCHC 32.5  RDW 14.2  PLT 182   Thyroid   Recent Labs  Lab 02/27/24 0506  TSH 1.126    BNPNo results for input(s): BNP, PROBNP in the last 168 hours.  DDimer No results for input(s): DDIMER in the last 168 hours.   Radiology  No results found.  Cardiac Studies Echocardiogram 01/2023 IMPRESSIONS     1. Very limited study. Left ventricular ejection fraction, by estimation,  is 60 to 65%. The left ventricle has normal function. Left ventricular  endocardial border not optimally defined to evaluate regional wall motion.  There is moderate left  ventricular hypertrophy. Left ventricular diastolic parameters are  indeterminate. The average left ventricular global longitudinal strain is  -11.7 %. The global longitudinal strain is abnormal.   2. Right ventricular  systolic function is normal. The right ventricular  size is normal.   3. The mitral valve is normal in structure. No evidence of mitral valve  regurgitation. No evidence of mitral stenosis.   4. The aortic valve was not well visualized. Aortic valve regurgitation  is mild. No aortic stenosis is present.   5. The inferior vena cava is normal in size with greater than 50%  respiratory variability, suggesting right atrial pressure of 3 mmHg.   Lexiscan  01/2023   The study is normal. The study is low risk.   No ST deviation was noted.   Left ventricular function is normal. Nuclear stress EF: 63%. The left ventricular ejection fraction is normal (55-65%). End diastolic cavity size is normal.   Prior study not available for comparison.  Do not have the official report from [facility] for echocardiogram or Lexiscan  preformed  there  Patient Profile   78 y.o. male with a past medical history of CAD s/p distant prior RCA PCI (1994), distant prior LAD PCI (1996 after tx with t-PA for anterior STEMI), repeat PCI of p/mRCA (1998) repeat LAD+RCA PCI (2002) for ISR with atherectomy, repeat PCI of the p/mRCA for ISR (01/19/03), RCA PCI (2010), LAD ISR/CTO and ISR of the RCA (09/09/10), with subsequent CABG, hypertension, hyperlipidemia, CKD and T2DM who had presented to [place] on 8/17 for progressive shortness of breath and fatigue. In the ED he was hypertensive, pro BNP (1600), negative troponins, and CTA/CXR unremarkable. He did develop hypoxia (88% on RA) so he was admitted for acute respiratory failure. Echo showed LVEF 55-60% with no RWMA. He developed a fever (102.6 F) and received ceftriaxone. Cardiology was consulted and recommended a Lexiscan  which was abnormal, prompting his transfer to [facility] for further evaluation.   Assessment & Plan  CAD s/p multiple PCI s/p CABG in 2012 [ LIMA to LAD, Vein to posterolateral branch off distal RCA, Vein to 2nd Diag] Abnormal Lexiscan   CKD baseline 1.8-2.2 Progressive dyspnea on exertion. Historically his ischemic events have presented with chest pain and extreme onset of fatigue.  Patient had a normal Lexiscan  01/2023. A repeat scan was obtained due to new unexplained symptoms, and Lexiscan  02/2024 showed at least partially reversible myocardial perfusion defect at the inferior wall noted from mid to base consistent with inducible myocardial ischemia.  ECG without ischemic changes and troponin negative.  Echo showed LVEF 55-60% with no RWMA. Cr at baseline for patient.   Given new onset of symptoms and abnormal Lexiscan  will pursue cardiac catheterization today. Will also obtain RHC as patient has been dealing with chronic baseline SOB and his pro-BNP was elevated at [facility]. Patient to remain NPO until after the procedure Echocardiogram pending as we cannot access the images and  formal report at [facility]  Continue Asa 81 mg Continue plavix  75 mg  Continue lopressor  12.5 mg BID Continue imdur  30mg  Continue Ranexa  500 mg BID  Acute Hypoxia SpO2 88% on RA at [facility] Patient resting comfortably on RA with SpO2 93% Continue to monitor  Obtained RHC today. See above.   Hypertension BP: 161/68 Continue lopressor  12.5 mg BID Continue imdur  30 mg Continue to hold losartan  until after cath  Hyperlipidemia LDL 54   HDL 39 Lipoprotein (a) pending Patient is on Livalo  OP, non-formulary will resume at discharge Continue zetia  10 mg   Episode of fever  At [facility] patient had one instance of elevated temperature (102). He was given one dose of ceftriaxone. Reported no systemic illness symptoms. No leukocytosis noted on  CBC. CXR unremarkable. Will continue to monitor.   T2DM A1c 6.7% Hold PTA Trulicity until discharge   Glucose readings have been elevated (223 fasting) will add SSI after cath once patient is no longer NPO  BPH Continue flomax  0.4 mg  GERD  Continue protonix  40 mg  Code status: full Dvt Ppx: heparin  subq   For questions or updates, please contact [facility] Please consult [email or web address] for contact info under     Signed, Leontine LOISE Salen, PA-C  02/27/2024, 9:51 AM    Agree with note by Leontine Salen, PA-C  Patient of Dr. Edwyna in [place].  He is a retired [employer].  He was transferred from [facility] after presenting with sudden onset of progressive dyspnea over the last several days of unclear etiology.  He has had bypass surgery 13 years ago by Dr. Dusty  with a LIMA to his LAD, vein to the second diagonal branch and the posterolateral branch of the RCA.  His enzymes were end of hospital were negative.  His BNP was mildly elevated.  Chest x-ray showed no active disease.  2D echo showed preserved LV function.  Myoview  stress test showed inferior ischemia new since his prior stress test a year ago.  He does have  baseline CKD with a serum creatinine in the 2 range which is his baseline.  His exam is benign today.  Plan right heart cath, left heart cath and grafts today as an add-on.  I have reviewed the risks, indications, and alternatives to cardiac catheterization, possible angioplasty, and stenting with the patient. Risks include but are not limited to bleeding, infection, vascular injury, stroke, myocardial infection, arrhythmia, kidney injury, radiation-related injury in the case of prolonged fluoroscopy use, emergency cardiac surgery, and death. The patient understands the risks of serious complication is 1-2 in 1000 with diagnostic cardiac cath and 1-2% or less with angioplasty/stenting.    Dorn DOROTHA Lesches, M.D., FACP, [facility], FAHA, [facility]  [address], [address] [place], [place]  [address]  [phone] 02/27/2024 10:50 AM

## 2024-02-27 NOTE — Interval H&P Note (Signed)
 History and Physical Interval Note:  02/27/2024 5:29 PM  Glenn Roach  has presented today for surgery, with the diagnosis of shortness of breath - abnormal stress.  The various methods of treatment have been discussed with the patient and family. After consideration of risks, benefits and other options for treatment, the patient has consented to  Procedure(s): RIGHT/LEFT HEART CATH AND CORONARY/GRAFT ANGIOGRAPHY (N/A) as a surgical intervention.  The patient's history has been reviewed, patient examined, no change in status, stable for surgery.  I have reviewed the patient's chart and labs.  Questions were answered to the patient's satisfaction.     Ozell Fell

## 2024-02-27 NOTE — Plan of Care (Signed)
  Problem: Education: Goal: Knowledge of General Education information will improve Description: Including pain rating scale, medication(s)/side effects and non-pharmacologic comfort measures Outcome: Progressing   Problem: Clinical Measurements: Goal: Ability to maintain clinical measurements within normal limits will improve Outcome: Progressing   Problem: Health Behavior/Discharge Planning: Goal: Ability to manage health-related needs will improve Outcome: Progressing   Problem: Clinical Measurements: Goal: Will remain free from infection Outcome: Progressing   Problem: Clinical Measurements: Goal: Cardiovascular complication will be avoided Outcome: Progressing   Problem: Safety: Goal: Ability to remain free from injury will improve Outcome: Progressing   Problem: Cardiovascular: Goal: Ability to achieve and maintain adequate cardiovascular perfusion will improve Outcome: Progressing

## 2024-02-27 NOTE — Progress Notes (Signed)
  Echocardiogram 2D Echocardiogram has been performed.  Glenn Roach 02/27/2024, 10:52 AM

## 2024-02-27 NOTE — Plan of Care (Signed)
  Problem: Education: Goal: Knowledge of General Education information will improve Description: Including pain rating scale, medication(s)/side effects and non-pharmacologic comfort measures Outcome: Progressing   Problem: Clinical Measurements: Goal: Ability to maintain clinical measurements within normal limits will improve Outcome: Progressing Goal: Will remain free from infection Outcome: Progressing   Problem: Coping: Goal: Level of anxiety will decrease Outcome: Progressing   Problem: Pain Managment: Goal: General experience of comfort will improve and/or be controlled Outcome: Progressing   Problem: Safety: Goal: Ability to remain free from injury will improve Outcome: Progressing   Problem: Skin Integrity: Goal: Risk for impaired skin integrity will decrease Outcome: Progressing

## 2024-02-27 NOTE — H&P (Signed)
 Cardiology Admission History and Physical:   Patient ID: Glenn Roach MRN: [id number]; DOB: [birth year]   Admission date: 02/26/2024  Primary Care Provider: Fernand Roach LABOR, MD [facility] Cardiologist: Dr. 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 for positive nuclear stress test following evaluation for worsening DOE.   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  He noticed limitations to his typical activities such as visiting his wife at her facility when he can only walk halfway across a parking lot before having to stop because of his shortness of breath/dyspnea on exertion.  He also noticed that with walking up only 1 staircase he was very limited whereas before it had taken him at least 3 staircases of activity to be fatigued.  He denies any orthopnea, PND, or lower extremity edema.  He has had mild venous stasis bilaterally that is worse at the end of the day but has not significantly changed recently.  He denies any sick contacts.  He denies any chest pain or other prior anginal equivalents.  In the past he has had some chest pain with associated shortness of breath prior to his multiple coronary interventions.  This time he mainly reports dyspnea on exertion with associated fatigue.  He noticed this worse Saturday night and by Sunday morning he was feeling unwell  with general malaise.  He denies any other associated symptoms.  He has been continue on Plavix  but does not take aspirin .  Evaluation at [facility] with HTN but otherwise normal VS and normal trop I (< 0.01).  VS: BP 158/77, P 80, RR 18, T 99.6. Pro BNP was elevated (1600). RVP negative for Flu/Covid. CTA chest was unremarkable for PE. CXR without cardiopulmonary process.   ECG (02/25/24, 14:44:13): NSR 81, PR 264, QRS 96, QT/c 360/418, no acute ischemic changes.  He was going to be discharged from the hospital however had mild hypoxia (O2 88%/RA) with improvement with rest (91%) so he was going to be admitted for acute hypoxic respiratory failure to the medicine service (02/25/24).  Troponins eventually ruled out. TTE (02/26/24) with normal LVEF (55-60%) and no regional wall motion abnormalities. There is a documented fever (102.6 at 20:00) 08/17 following admission but otherwise he was afebrile. He was tx with ceftriaxone. He denied any respiratory sx such as fever, chills, or cough.   Cardiology was consulted and Dr. Raford had recommended repeat Lexiscan  be performed.  The Lexiscan  was abnormal with at least partially reversible myocardial perfusion defect at the inferior wall noted from mid to base consistent with inducible myocardial ischemia.  He was then transferred to [facility] for repeat coronary assessment.  During my evaluation he was resting comfortably in bed and asymptomatic. NSR 77, BP 122/65.   ECG (02/26/24, 22:36:21): NSR 76, PR 248, QRS 102, QT/c 400/450, no acute ischemic changes.   Past Medical History:  Diagnosis Date   CAD (coronary  artery disease)    Chest tightness    CKD (chronic kidney disease) stage 3, GFR 30-59 ml/min (HCC) 02/03/2022   Coronary artery disease involving coronary bypass graft of native heart with angina pectoris (HCC) 10/28/2008   Qualifier: Diagnosis of  By: Jaramillo, Luz     Diabetes mellitus due to underlying condition with unspecified complications  (HCC) 10/28/2008   Qualifier: Diagnosis of  By: Justina Kos     Dyslipidemia    Essential hypertension 04/14/2015   GERD (gastroesophageal reflux disease)    History of kidney stones    Mild renal insufficiency 04/14/2015   Mixed dyslipidemia 01/19/2017   NEPHROLITHIASIS, HX OF 10/28/2008   Qualifier: Diagnosis of  By: Justina Kos     Non morbid obesity due to excess calories 04/14/2015   Sleep apnea    Trimalleolar fracture of ankle, closed, left, initial encounter 02/03/2022   Past Surgical History:  Procedure Laterality Date   CORONARY ARTERY BYPASS GRAFT     FINGER SURGERY Right    3rd   FINGER SURGERY Left    Thumb   ORIF ANKLE FRACTURE Left 02/03/2022   Procedure: OPEN REDUCTION INTERNAL FIXATION (ORIF) ANKLE FRACTURE TRIMALLEOLAR FRACTURE;  Surgeon: Kit Rush, MD;  Location: [facility] OR;  Service: Orthopedics;  Laterality: Left;    Medications Prior to Admission: Prior to Admission medications   Medication Sig Start Date End Date Taking? Authorizing Provider  clopidogrel  (PLAVIX ) 75 MG tablet Take 1 tablet (75 mg total) by mouth daily. 05/15/23  Yes Revankar, Rajan R, MD  Coenzyme Q-10 200 MG CAPS Take 200 mg by mouth daily.    Yes [provider]  ezetimibe  (ZETIA ) 10 MG tablet Take 1 tablet (10 mg total) by mouth daily. 05/15/23  Yes Revankar, Jennifer SAUNDERS, MD  fenofibrate  160 MG tablet Take 1 tablet (160 mg total) by mouth daily. Patient needs appointment for further refills. 1 st attempt 01/22/24  Yes Revankar, Jennifer SAUNDERS, MD  glimepiride (AMARYL) 2 MG tablet Take 2 mg by mouth 2 (two) times daily.   Yes [provider]  isosorbide  mononitrate (IMDUR ) 30 MG 24 hr tablet Take 1 tablet (30 mg total) by mouth daily. 05/15/23  Yes Revankar, Jennifer SAUNDERS, MD  LIVALO  2 MG TABS Take 1 tablet (2 mg total) by mouth daily. 06/16/23  Yes Revankar, Jennifer SAUNDERS, MD  losartan  (COZAAR ) 25 MG tablet Take 25 mg by mouth daily.   Yes [provider]  Melatonin 10 MG TBCR Take 10 mg  by mouth at bedtime as needed (sleep).   Yes [provider]  metoprolol  tartrate (LOPRESSOR ) 25 MG tablet TAKE 1/2 TABLET BY MOUTH 2 TIMES DAILY. 02/06/24  Yes Revankar, Jennifer SAUNDERS, MD  Multiple Vitamins-Minerals (CENTRUM SILVER PO) Take 1 tablet by mouth daily.   Yes [provider]  nitroGLYCERIN  (NITROSTAT ) 0.4 MG SL tablet PLACE 1 TABLET UNDER THE TONGUE EVERY 5 MINUTES AS NEEDED FOR CHEST PAIN. 03/27/23  Yes Revankar, Jennifer SAUNDERS, MD  Omega-3 Fatty Acids (FISH OIL TRIPLE STRENGTH) 1400 MG CAPS Take 1,400 mg by mouth daily.   Yes [provider]  pantoprazole  (PROTONIX ) 40 MG tablet Take 40 mg by mouth daily.    Yes [provider]  ranolazine  (RANEXA ) 500 MG 12 hr tablet Take 1 tablet (500 mg total) by mouth 2 (two) times daily. 05/15/23  Yes Revankar, Jennifer SAUNDERS, MD  tamsulosin  (FLOMAX ) 0.4 MG CAPS capsule Take 0.4 mg by mouth daily. 08/15/19  Yes [provider]  TRULICITY  1.5 MG/0.5ML SOPN Inject 1.5 mg into the skin every Sunday. 04/13/20  Yes [provider]  zolpidem  (AMBIEN ) 5 MG tablet Take 5 mg by mouth at bedtime as needed for sleep. 09/01/21  Yes [provider]    Allergies:    Allergies  Allergen Reactions   Crestor [Rosuvastatin] Other (See Comments)    Diarrhea   Morphine  Nausea And Vomiting   Ramipril Cough   Ticlopidine Hcl      Neutropenia (low white blood count)   Social History:   Social History   Socioeconomic History   Marital status: Married    Spouse name: Not on file   Number of children: Not on file   Years of education: Not on file   Highest education level: Not on file  Occupational History   Not on file  Tobacco Use   Smoking status: Former   Smokeless tobacco: Never  Vaping Use   Vaping status: Never Used  Substance and Sexual Activity   Alcohol use: No   Drug use: No   Sexual activity: Not on file  Other Topics Concern   Not on file  Social History Narrative   Not on file   Social Drivers  of Health   Financial Resource Strain: Not on file  Food Insecurity: No Food Insecurity (02/26/2024)   Hunger Vital Sign    Worried About Running Out of Food in the Last Year: Never true    Ran Out of Food in the Last Year: Never true  Transportation Needs: No Transportation Needs (02/26/2024)   PRAPARE - Administrator, Civil Service (Medical): No    Lack of Transportation (Non-Medical): No  Physical Activity: Not on file  Stress: Not on file  Social Connections: Moderately Isolated (02/26/2024)   Social Connection and Isolation Panel    Frequency of Communication with Friends and Family: Three times a week    Frequency of Social Gatherings with Friends and Family: Never    Attends Religious Services: Never    Database administrator or Organizations: No    Attends Banker Meetings: Never    Marital Status: Married  Catering manager Violence: Not At Risk (02/26/2024)   Humiliation, Afraid, Rape, and Kick questionnaire    Fear of Current or Ex-Partner: No    Emotionally Abused: No    Physically Abused: No    Sexually Abused: No    Family History:   The patient's family history includes Heart attack in his father; Heart disease in his mother; Heart failure in his mother; Stroke in his father.    ROS:   Review of Systems: [y] = yes, [ ]  = no      General: Weight gain [ ] ; Weight loss [ ] ; Anorexia [ ] ; Fatigue [ ] ; Fever [ ] ; Chills [ ] ; Weakness [ ]    Cardiac: Chest pain/pressure [ ] ; Resting SOB [ ] ; Exertional SOB [y]; Orthopnea [ ] ; Pedal Edema [ ] ; Palpitations [ ] ; Syncope [ ] ; Presyncope [ ] ; Paroxysmal nocturnal dyspnea [ ]    Pulmonary: Cough [ ] ; Wheezing [ ] ; Hemoptysis [ ] ; Sputum [ ] ; Snoring [ ]    GI: Vomiting [ ] ; Dysphagia [ ] ; Melena [ ] ; Hematochezia [ ] ; Heartburn [ ] ; Abdominal pain [ ] ; Constipation [ ] ; Diarrhea [ ] ; BRBPR [ ]    GU: Hematuria [ ] ; Dysuria [ ] ; Nocturia [ ]  Vascular: Pain in legs with walking [ ] ; Pain in feet with lying  flat [ ] ;  Non-healing sores [ ] ; Stroke [ ] ; TIA [ ] ; Slurred speech [ ] ;   Neuro: Headaches [ ] ; Vertigo [ ] ; Seizures [ ] ; Paresthesias [ ] ;Blurred vision [ ] ; Diplopia [ ] ; Vision changes [ ]    Ortho/Skin: Arthritis [ ] ; Joint pain [ ] ; Muscle pain [ ] ; Joint swelling [ ] ; Back Pain [ ] ; Rash [ ]    Psych: Depression [ ] ; Anxiety [ ]    Heme: Bleeding problems [ ] ; Clotting disorders [ ] ; Anemia [ ]    Endocrine: Diabetes [ ] ; Thyroid  dysfunction [ ]    Physical Exam/Data:   Vitals:   02/26/24 2118 02/26/24 2351 02/27/24 0055  BP: 116/73 (!) 153/71 (!) 113/57  Pulse: 79 92 78  Resp: 20 18   Temp: 99 F (37.2 C) (!) 97.4 F (36.3 C)   TempSrc: Oral Oral   SpO2: 97% 99%   Weight: 83.8 kg    Height: 5' 8 (1.727 m)     No intake or output data in the 24 hours ending 02/27/24 0140    02/26/2024    9:18 PM 01/24/2023   10:54 AM 01/17/2023    2:02 PM  Last 3 Weights  Weight (lbs) 184 lb 12.8 oz 191 lb 191 lb 9.6 oz  Weight (kg) 83.825 kg 86.637 kg 86.909 kg     Body mass index is 28.1 kg/m.  General:  Well nourished, well developed, in no acute distress HEENT: normal Neck: no JVD Vascular: No carotid bruits; FA pulses 2+ bilaterally without bruits  Cardiac:  normal S1, S2; RRR; no murmur  Lungs:  clear to auscultation bilaterally, no wheezing, rhonchi or rales  Abd: soft, nontender, no hepatomegaly  Ext: no edema Musculoskeletal:  No deformities, BUE and BLE strength normal and equal Skin: warm and dry  Neuro:  CNs 2-12 intact, no focal abnormalities noted Psych:  Normal affect   EKG:  (02/25/24, 14:44:13): NSR 81, PR 264, QRS 96, QT/c 360/418, no acute ischemic changes.  Relevant CV Studies:  TTE [facility]) Result date: 02/26/24 Left-ventricular chamber size is normal. Left ventricular ejection fraction is 55 to 60%. There is mild aortic regurgitation.  There is mild aortic sclerosis without evidence of stenosis.  There is trivial mitral regurgitation.  There is  mild tricuspid valve regurgitation.  Pulmonary artery systolic pressure was 27 mmHg.  The inferior vena cava is not well-visualized.  The aortic root appears within normal limits.  Ascending aorta and ascending aorta are not well-visualized.  Lexiscan  [facility])  Result date: 02/26/24  Abnormal myocardial perfusion study.   At least partially reversible myocardial perfusion defect of the inferior wall noted from mid to base consistent with inducible myocardial ischemia. Left ventricular ejection fraction 66%.   Laboratory Data: Trop I ruled out (< 0.01 x3)   Radiology/Studies:   CTA chest [facility])  Result date: 02/25/24 No angiographic abnormality.  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   CAD s/p multiple coronary interventions  Abnormal perfusion study with inferior ischemia  DOE He has had multiple episodes of ISR of both his RCA and L Cx ultimately requiring CABG. He has not had coronary evaluation since his CABG in 2012. May have lost one of his SVGs. I cannot locate the operative report to see his bypass anatomy currently. Plan for coronary  angiography today pending availability. Risks, benefits and alternatives reviewed. He is on plavix  long term, resumed ASA as well.  - continue OP plavix  75 mg daily  - start ASA 81 mg daily  - continue OP imdur  30 mg daily  - hold OP losartan  25 mg daily pending cath  - continue OP ranexa  500 mg bid  - TTE ordered  DM2 - hold OP Trulicity, takes once weekly, has been ~10 days since last dose, resume at d/c, his A1c are 6.5-7 so no indication for SSI  HTN - hold OP losartan  25 mg daily pending cath  - continue OP imdur  and metop tartrate as above   HLD - hold OP Levalo, OP Levalo non-formulary, he was on Lipitor in the distant past, looks like he had cramping/weakness issues, also on Crestor before with similar issues - resume OP Levalo on discharge  - continue OP zetia  10 mg daily   Informed Consent   Shared Decision Making/Informed Consent The risks [stroke (1 in 1000), death (1 in 1000), kidney failure [usually temporary] (1 in 500), bleeding (1 in 200), allergic reaction [possibly serious] (1 in 200)], benefits (diagnostic support and management of coronary artery disease) and alternatives of a cardiac catheterization were discussed in detail with Mr. Rehman and he is willing to proceed.    Severity of Illness: The appropriate patient status for this patient is INPATIENT. Inpatient status is judged to be reasonable and necessary in order to provide the required intensity of service to ensure the patient's safety. The patient's presenting symptoms, physical exam findings, and initial radiographic and laboratory data in the context of their chronic comorbidities is felt to place them at high risk for further clinical deterioration. Furthermore, it is not anticipated that the patient will be medically stable for discharge from the hospital within 2 midnights of admission.   * I certify that at the point of admission it is my clinical judgment that the patient will require inpatient  hospital care spanning beyond 2 midnights from the point of admission due to high intensity of service, high risk for further deterioration and high frequency of surveillance required.*   For questions or updates, please contact [facility] Please consult [email or web address] for contact info under   Signed, Donnice DELENA Primus, MD  02/27/2024 1:40 AM

## 2024-02-28 ENCOUNTER — Encounter: Payer: Self-pay | Admitting: Internal Medicine

## 2024-02-28 ENCOUNTER — Encounter (HOSPITAL_COMMUNITY): Payer: Self-pay | Admitting: Cardiovascular Disease

## 2024-02-28 ENCOUNTER — Other Ambulatory Visit: Payer: Self-pay | Admitting: Cardiology

## 2024-02-28 DIAGNOSIS — I2510 Atherosclerotic heart disease of native coronary artery without angina pectoris: Secondary | ICD-10-CM | POA: Diagnosis not present

## 2024-02-28 LAB — BASIC METABOLIC PANEL WITH GFR
Anion gap: 7 (ref 5–15)
BUN: 25 mg/dL — ABNORMAL HIGH (ref 8–23)
CO2: 22 mmol/L (ref 22–32)
Calcium: 9 mg/dL (ref 8.9–10.3)
Chloride: 112 mmol/L — ABNORMAL HIGH (ref 98–111)
Creatinine, Ser: 1.75 mg/dL — ABNORMAL HIGH (ref 0.61–1.24)
GFR, Estimated: 39 mL/min — ABNORMAL LOW (ref >60)
Glucose, Bld: 106 mg/dL — ABNORMAL HIGH (ref 70–99)
Potassium: 4.7 mmol/L (ref 3.5–5.1)
Sodium: 141 mmol/L (ref 135–145)

## 2024-02-28 LAB — CBC
HCT: 42 % (ref 39.0–52.0)
Hemoglobin: 13.4 g/dL (ref 13.0–17.0)
MCH: 29.4 pg (ref 26.0–34.0)
MCHC: 31.9 g/dL (ref 30.0–36.0)
MCV: 92.1 fL (ref 80.0–100.0)
Platelets: 195 K/uL (ref 150–400)
RBC: 4.56 MIL/uL (ref 4.22–5.81)
RDW: 14.1 % (ref 11.5–15.5)
WBC: 6 K/uL (ref 4.0–10.5)
nRBC: 0 % (ref 0.0–0.2)

## 2024-02-28 LAB — GLUCOSE, CAPILLARY
Glucose-Capillary: 112 mg/dL — ABNORMAL HIGH (ref 70–99)
Glucose-Capillary: 224 mg/dL — ABNORMAL HIGH (ref 70–99)

## 2024-02-28 LAB — LIPOPROTEIN A (LPA): Lipoprotein (a): 100.6 nmol/L — ABNORMAL HIGH (ref <75.0)

## 2024-02-28 MED ORDER — LOSARTAN POTASSIUM 25 MG PO TABS
25.0000 mg | ORAL_TABLET | Freq: Every day | ORAL | Status: DC
  Administered 2024-02-28: 25 mg via ORAL
  Filled 2024-02-28: qty 1

## 2024-02-28 NOTE — Progress Notes (Signed)
 Discussed with pt diet, exercise, NTG, and CRPII. Pt receptive. Will refer to [facility] with the diagnosis of stable angina.  [id number] Aliene Aris BS, ACSM-CEP 02/28/2024 12:43 PM

## 2024-02-28 NOTE — Discharge Summary (Addendum)
 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   Origin lesion is 100% stenosed.   Origin lesion is 100% stenosed.   Prox LAD to Mid LAD lesion is 100% stenosed.   Mid LM to Ost LAD lesion is 40% stenosed.   LIMA.   1.  Severe two-vessel coronary artery disease with total occlusion of the RCA and total occlusion of the LAD, both occluded segments are within the old stents 2.  Patent left main with 40% distal left main stenosis 3.  Patent circumflex with mild diffuse plaquing but no significant stenosis 4.  Continued patency of the LIMA to LAD graft with no stenosis 5.  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  Chronic total occlusion of the RCA collateralized by left-to-right collaterals 7.  Essentially normal right heart catheterization with the following hemodynamics: RA mean 5 mmHg RV 24/4 mmHg PA 22/8 mean 12 mmHg Pulmonary wedge pressure 4 mmHg LVEDP 9 mmHg Cardiac output 3.92 L/min/cardiac index 1.98 L/min/m   Recommendations: Medical therapy for CAD, no targets for intervention.  Right heart pressures are normal/low.  I think the cardiac index data is probably not indicative of a true low output state as the  patient appears clinically stable with no normal LV and RV function by noninvasive assessment.  Diagnostic Dominance: Right   Echocardiogram 8/19 IMPRESSIONS     1. Left ventricular ejection fraction, by estimation, is 60 to 65%. The  left ventricle has normal function. The left ventricle demonstrates  regional wall motion abnormalities (see scoring diagram/findings for  description). There is mild concentric left  ventricular hypertrophy. Left ventricular diastolic parameters are  consistent with Grade I diastolic dysfunction (impaired relaxation). There  is mild hypokinesis of the left ventricular, basal-mid inferior wall.   2. Right ventricular systolic function is normal. The right ventricular  size is normal. There is normal pulmonary artery systolic pressure. The  estimated right ventricular systolic pressure is 16.1 mmHg.   3. The mitral valve is normal in structure. No evidence of mitral valve  regurgitation. No evidence of mitral stenosis.   4. The aortic valve is tricuspid. Aortic valve regurgitation is trivial.  Aortic valve sclerosis is present, with no evidence of aortic valve  stenosis.   5. The inferior vena cava is normal in size with greater than 50%  respiratory variability, suggesting right atrial pressure of 3 mmHg.   Patient had Lexiscan  at [facility] which showed at least partially reversible myocardial perfusion defect at the inferior wall noted from mid to base consistent with inducible myocardial ischemia.   _____________   History of Present Illness   Glenn Roach is a 78 y.o. male with a past  medical history of CAD s/p distant prior RCA PCI (1994), distant prior LAD PCI (1996 after tx with t-PA for anterior STEMI), repeat PCI of p/mRCA (1998) repeat LAD+RCA PCI (2002) for ISR with atherectomy, repeat PCI of the p/mRCA for ISR (01/19/03), RCA PCI (2010), LAD ISR/CTO and ISR of the RCA (09/09/10), with subsequent CABG, hypertension, hyperlipidemia, CKD and  T2DM who had presented to [facility] on 8/17 for progressive shortness of breath and fatigue. In the ED he was hypertensive, pro BNP (1600), negative troponins, and CTA/CXR unremarkable. He did develop hypoxia (88% on RA) so he was admitted for acute respiratory failure. Echo showed LVEF 55-60% with no RWMA. He developed a fever (102.6 F) and received ceftriaxone. Cardiology was consulted and recommended a Lexiscan  to be repeated. This scan was abnormal, prompting his transfer to [facility] for further evaluation   Hospital Course   Consultants: None   CAD s/p multiple PCI s/p CABG in 2012 [ LIMA to LAD, Vein to posterolateral branch off distal RCA, Vein to 2nd Diag] Abnormal Lexiscan   CKD baseline 1.8-2.2 Lexiscan  at [facility] 02/2024 showed at least partially reversible myocardial perfusion defect at the inferior wall noted from mid to base consistent with inducible myocardial ischemia.  ECG without ischemic changes and troponin negative.    Given new onset of symptoms and abnormal Lexiscan  a cardiac catheterization was pursued on 8/19. Obtained RHC as well as patient had been dealing with chronic baseline SOB and his pro-BNP was elevated at [facility]. RHC/LHC showed CTO of both vein grafts and patent LIMA to LAD with no stenosis. Chronic severe two vessel disease with CTO of RCA and LAD.  No intervention was made given chronicity of occlusions. RHC was normal. Echocardiogram obtained showed LVEF 60-65% with RWMA [mild hypokinesis of basal- mid inferior wall] with Grade I Diastolic Dysfunction.    Discontinued Asa 81 mg at discharge, this was started this admission Continue plavix  75 mg  Continue lopressor  12.5 mg BID Continue imdur  30 mg Continue Ranexa  500 mg BID   Acute Hypoxia SpO2 88% on RA at [facility] Patient resting comfortably on RA with SpO2 95% on day of discharge Normal RHC on 8/19   Hypertension BP: 141/73 Continue lopressor  12.5 mg BID Continue imdur  30 mg Home losartan  was  re-started on day of discharge   Hyperlipidemia LDL 54   HDL 39 Lipoprotein (a) 100.6 Livalo  OP resumed at discharge Continue zetia  10 mg  Fenofibrate  out of refills, he sees his primary cardiologist 9/9. Will defer to refill.    Episode of fever  At [facility] patient had one instance of elevated temperature (102). He was given one dose of ceftriaxone. Reported no systemic illness symptoms. No leukocytosis noted on CBC. CXR unremarkable. He remained asymptomatic and afebrile during admission.     T2DM A1c 6.7% Resumed Trulicity at discharge     BPH Continue flomax  0.4 mg   GERD  Continue protonix  40 mg  General: Well developed, well nourished, male appearing in no acute distress. Head: Normocephalic, atraumatic.  Lungs:  Resp regular and unlabored, CTA. Heart: RRR  Extremities: No clubbing, cyanosis, edema. Distal pedal pulses are 2+ bilaterally. L cath site stable without bruising or hematoma Neuro: Alert and oriented X 3. Moves all extremities spontaneously. Psych: Normal affect.  Patient examined by myself and by Dr. Court. They were deemed ready for discharge from a cardiac perspective. Medication education provided by pharmacy. Follow up appointment is arranged.      Did the patient have an acute coronary  syndrome (MI, NSTEMI, STEMI, etc) this admission?:  No                               Did the patient have a percutaneous coronary intervention (stent / angioplasty)?:  No.    Follow-up scheduled with Dr. Edwyna 03/19/24    _____________  Discharge Vitals Blood pressure (!) 141/73, pulse 67, temperature 98.6 F (37 C), temperature source Oral, resp. rate 16, height 5' 8 (1.727 m), weight 82 kg, SpO2 95%.  Filed Weights   02/26/24 2118 02/27/24 1517  Weight: 83.8 kg 82 kg    Labs & Radiologic Studies  CBC Recent Labs    02/27/24 0506 02/27/24 1815 02/28/24 0423  WBC 9.3  --  6.0  HGB 13.7 12.9*  12.6* 13.4  HCT 42.2 38.0*  37.0* 42.0  MCV 91.3  --   92.1  PLT 182  --  195   Basic Metabolic Panel Recent Labs    91/80/74 0506 02/27/24 1815 02/28/24 0423  NA 139 142  141 141  K 4.7 4.2  4.2 4.7  CL 110  --  112*  CO2 22  --  22  GLUCOSE 227*  --  106*  BUN 25*  --  25*  CREATININE 2.05*  --  1.75*  CALCIUM  9.1  --  9.0   Liver Function Tests No results for input(s): AST, ALT, ALKPHOS, BILITOT, PROT, ALBUMIN  in the last 72 hours. No results for input(s): LIPASE, AMYLASE in the last 72 hours. High Sensitivity Troponin:   No results for input(s): TROPONINIHS in the last 720 hours.  No results for input(s): TRNPT in the last 720 hours.  BNP Invalid input(s): POCBNP No results for input(s): PROBNP in the last 72 hours.  No results for input(s): BNP in the last 72 hours.  D-Dimer No results for input(s): DDIMER in the last 72 hours. Hemoglobin A1C Recent Labs    02/27/24 0506  HGBA1C 6.7*   Fasting Lipid Panel Recent Labs    02/27/24 0506  CHOL 122  HDL 39*  LDLCALC 54  TRIG 852  CHOLHDL 3.1   Lipoprotein (a)  Date/Time Value Ref Range Status  02/27/2024 05:06 AM 100.6 (H) <75.0 nmol/L Final    Comment:    (NOTE) This test was developed and its performance characteristics determined by [facility]. It has not been cleared or approved by the Food and Drug Administration. Note:  Values greater than or equal to 75.0 nmol/L may       indicate an independent risk factor for CHD,       but must be evaluated with caution when applied       to non-Caucasian populations due to the       influence of genetic factors on Lp(a) across       ethnicities. Performed At: [facility] [address], [place] [id number] Jennette Shorter MD Ey:[id number]     Thyroid  Function Tests Recent Labs    02/27/24 0506  TSH 1.126   _____________  CARDIAC CATHETERIZATION Result Date: 02/27/2024   Prox RCA to Mid RCA lesion is 100% stenosed.   Origin lesion is 100% stenosed.   Origin lesion  is 100% stenosed.   Prox LAD to Mid LAD lesion is 100% stenosed.   Mid LM to Ost LAD lesion is 40% stenosed.   LIMA. 1.  Severe two-vessel coronary artery disease with total occlusion of the RCA and total  occlusion of the LAD, both occluded segments are within the old stents 2.  Patent left main with 40% distal left main stenosis 3.  Patent circumflex with mild diffuse plaquing but no significant stenosis 4.  Continued patency of the LIMA to LAD graft with no stenosis 5.  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  Chronic total occlusion of the RCA collateralized by left-to-right collaterals 7.  Essentially normal right heart catheterization with the following hemodynamics: RA mean 5 mmHg RV 24/4 mmHg PA 22/8 mean 12 mmHg Pulmonary wedge pressure 4 mmHg LVEDP 9 mmHg Cardiac output 3.92 L/min/cardiac index 1.98 L/min/m Recommendations: Medical therapy for CAD, no targets for intervention.  Right heart pressures are normal/low.  I think the cardiac index data is probably not indicative of a true low output state as the patient appears clinically stable with no normal LV and RV function by noninvasive assessment.   ECHOCARDIOGRAM COMPLETE Result Date: 02/27/2024    ECHOCARDIOGRAM REPORT   Patient Name:   Glenn Roach Date of Exam: 02/27/2024 Medical Rec #:  [id number]        Height:       68.0 in Accession #:    [id number]       Weight:       184.8 lb Date of Birth:  [birth year]       BSA:          1.976 m Patient Age:    78 years         BP:           161/68 mmHg Patient Gender: M                HR:           67 bpm. Exam Location:  Inpatient Procedure: 2D Echo, Cardiac Doppler and Color Doppler (Both Spectral and Color            Flow Doppler were utilized during procedure). Indications:    Acute ischemic heart disease  History:        Patient has prior history of Echocardiogram examinations, most                 recent 02/10/2023. CAD,  Signs/Symptoms:Dyspnea; Risk                 Factors:Hypertension, Sleep Apnea and Dyslipidemia. Ckd stage 3.  Sonographer:    Philomena Daring Referring Phys: [id number] MATTHEW A CARLISLE IMPRESSIONS  1. Left ventricular ejection fraction, by estimation, is 60 to 65%. The left ventricle has normal function. The left ventricle demonstrates regional wall motion abnormalities (see scoring diagram/findings for description). There is mild concentric left ventricular hypertrophy. Left ventricular diastolic parameters are consistent with Grade I diastolic dysfunction (impaired relaxation). There is mild hypokinesis of the left ventricular, basal-mid inferior wall.  2. Right ventricular systolic function is normal. The right ventricular size is normal. There is normal pulmonary artery systolic pressure. The estimated right ventricular systolic pressure is 16.1 mmHg.  3. The mitral valve is normal in structure. No evidence of mitral valve regurgitation. No evidence of mitral stenosis.  4. The aortic valve is tricuspid. Aortic valve regurgitation is trivial. Aortic valve sclerosis is present, with no evidence of aortic valve stenosis.  5. The inferior vena cava is normal in size with greater than 50% respiratory variability, suggesting right atrial pressure of 3 mmHg. Comparison(s): Prior images reviewed side by side.  Both studies are technically difficult. There is a suggestion of inferobasal hypokinesis that was not seen on the previous echo, but confidence is limited. FINDINGS  Left Ventricle: Left ventricular ejection fraction, by estimation, is 60 to 65%. The left ventricle has normal function. The left ventricle demonstrates regional wall motion abnormalities. Mild hypokinesis of the left ventricular, basal-mid inferior wall. The left ventricular internal cavity size was normal in size. There is mild concentric left ventricular hypertrophy. Left ventricular diastolic parameters are consistent with Grade I diastolic  dysfunction (impaired relaxation). Right Ventricle: The right ventricular size is normal. No increase in right ventricular wall thickness. Right ventricular systolic function is normal. There is normal pulmonary artery systolic pressure. The tricuspid regurgitant velocity is 1.81 m/s, and  with an assumed right atrial pressure of 3 mmHg, the estimated right ventricular systolic pressure is 16.1 mmHg. Left Atrium: Left atrial size was normal in size. Right Atrium: Right atrial size was normal in size. Pericardium: There is no evidence of pericardial effusion. Mitral Valve: The mitral valve is normal in structure. Mild mitral annular calcification. No evidence of mitral valve regurgitation. No evidence of mitral valve stenosis. Tricuspid Valve: The tricuspid valve is normal in structure. Tricuspid valve regurgitation is trivial. Aortic Valve: The aortic valve is tricuspid. Aortic valve regurgitation is trivial. Aortic valve sclerosis is present, with no evidence of aortic valve stenosis. Pulmonic Valve: The pulmonic valve was not well visualized. Pulmonic valve regurgitation is not visualized. No evidence of pulmonic stenosis. Aorta: The aortic root is normal in size and structure. Venous: The inferior vena cava is normal in size with greater than 50% respiratory variability, suggesting right atrial pressure of 3 mmHg. IAS/Shunts: No atrial level shunt detected by color flow Doppler.  LEFT VENTRICLE PLAX 2D LVIDd:         4.51 cm      Diastology LVIDs:         2.91 cm      LV e' medial:    6.96 cm/s LV PW:         1.23 cm      LV E/e' medial:  12.9 LV IVS:        1.26 cm      LV e' lateral:   9.79 cm/s LVOT diam:     2.29 cm      LV E/e' lateral: 9.2 LV SV:         79 LV SV Index:   40 LVOT Area:     4.12 cm  LV Volumes (MOD) LV vol d, MOD A2C: 93.4 ml LV vol d, MOD A4C: 120.0 ml LV vol s, MOD A2C: 33.0 ml LV vol s, MOD A4C: 40.3 ml LV SV MOD A2C:     60.4 ml LV SV MOD A4C:     120.0 ml LV SV MOD BP:      72.3 ml  RIGHT VENTRICLE             IVC RV S prime:     10.30 cm/s  IVC diam: 1.33 cm TAPSE (M-mode): 1.8 cm LEFT ATRIUM             Index        RIGHT ATRIUM           Index LA diam:        3.72 cm 1.88 cm/m   RA Area:     16.80 cm LA Vol (A2C):   42.4 ml 21.46 ml/m  RA Volume:  39.70 ml  20.09 ml/m LA Vol (A4C):   43.8 ml 22.16 ml/m LA Biplane Vol: 43.9 ml 22.21 ml/m  AORTIC VALVE LVOT Vmax:   78.20 cm/s LVOT Vmean:  55.700 cm/s LVOT VTI:    0.193 m  AORTA Ao Root diam: 2.47 cm Ao Asc diam:  3.06 cm MITRAL VALVE               TRICUSPID VALVE MV Area (PHT): 3.07 cm    TR Peak grad:   13.1 mmHg MV Decel Time: 247 msec    TR Vmax:        181.00 cm/s MV E velocity: 89.60 cm/s MV A velocity: 99.00 cm/s  SHUNTS MV E/A ratio:  0.91        Systemic VTI:  0.19 m                            Systemic Diam: 2.29 cm Jerel Croitoru MD Electronically signed by Jerel Balding MD Signature Date/Time: 02/27/2024/2:23:45 PM    Final     Disposition Pt is being discharged home today in good condition.  Follow-up Plans & Appointments  Discharge Instructions     Amb Referral to Cardiac Rehabilitation   Complete by: As directed    Diagnosis: Stable Angina   After initial evaluation and assessments completed: Virtual Based Care may be provided alone or in conjunction with Phase 2 Cardiac Rehab based on patient barriers.: Yes   Intensive Cardiac Rehabilitation (ICR) [facility] location only OR Traditional Cardiac Rehabilitation (TCR) *If criteria for ICR are not met will enroll in TCR [facility] only): Yes   Call MD for:  redness, tenderness, or signs of infection (pain, swelling, redness, odor or green/yellow discharge around incision site)   Complete by: As directed    Diet - low sodium heart healthy   Complete by: As directed    Discharge instructions   Complete by: As directed    Radial Site Care Refer to this sheet in the next few weeks. These instructions provide you with information on caring for yourself after your  procedure. Your caregiver may also give you more specific instructions. Your treatment has been planned according to current medical practices, but problems sometimes occur. Call your caregiver if you have any problems or questions after your procedure. HOME CARE INSTRUCTIONS You may shower the day after the procedure. Remove the bandage (dressing) and gently wash the site with plain soap and water . Gently pat the site dry.  Do not apply powder or lotion to the site.  Do not submerge the affected site in water  for 3 to 5 days.  Inspect the site at least twice daily.  Do not flex or bend the affected arm for 24 hours.  No lifting over 5 pounds (2.3 kg) for 5 days after your procedure.  Do not drive home if you are discharged the same day of the procedure. Have someone else drive you.  You may drive 24 hours after the procedure unless otherwise instructed by your caregiver.  What to expect: Any bruising will usually fade within 1 to 2 weeks.  Blood that collects in the tissue (hematoma) may be painful to the touch. It should usually decrease in size and tenderness within 1 to 2 weeks.  SEEK IMMEDIATE MEDICAL CARE IF: You have unusual pain at the radial site.  You have redness, warmth, swelling, or pain at the radial site.  You have drainage (other than a small  amount of blood on the dressing).  You have chills.  You have a fever or persistent symptoms for more than 72 hours.  You have a fever and your symptoms suddenly get worse.  Your arm becomes pale, cool, tingly, or numb.  You have heavy bleeding from the site. Hold pressure on the site.   Increase activity slowly   Complete by: As directed        Discharge Medications Allergies as of 02/28/2024       Reactions   Ticlopidine Hcl Other (See Comments)    Neutropenia (low white blood count)   Crestor [rosuvastatin] Other (See Comments)   Diarrhea   Morphine  Nausea And Vomiting   Ramipril Cough        Medication List      TAKE these medications    alendronate 70 MG tablet Commonly known as: FOSAMAX Take 70 mg by mouth once a week. Take with a full glass of water  on an empty stomach.   CENTRUM SILVER PO Take 1 tablet by mouth daily.   clopidogrel  75 MG tablet Commonly known as: PLAVIX  Take 1 tablet (75 mg total) by mouth daily.   Coenzyme Q-10 200 MG Caps Take 200 mg by mouth daily.   ezetimibe  10 MG tablet Commonly known as: ZETIA  Take 1 tablet (10 mg total) by mouth daily.   fenofibrate  160 MG tablet Take 1 tablet (160 mg total) by mouth daily. Patient needs appointment for further refills. 1 st attempt   Fish Oil Triple Strength 1400 MG Caps Take 1,400 mg by mouth daily.   glimepiride 2 MG tablet Commonly known as: AMARYL Take 2 mg by mouth daily with breakfast.   isosorbide  mononitrate 30 MG 24 hr tablet Commonly known as: IMDUR  Take 1 tablet (30 mg total) by mouth daily.   Livalo  2 MG Tabs Generic drug: Pitavastatin  Calcium  Take 1 tablet (2 mg total) by mouth daily.   losartan  25 MG tablet Commonly known as: COZAAR  Take 25 mg by mouth daily.   Melatonin 10 MG Tbcr Take 10 mg by mouth at bedtime as needed (sleep).   metoprolol  tartrate 25 MG tablet Commonly known as: LOPRESSOR  TAKE 1/2 TABLET BY MOUTH 2 TIMES DAILY. What changed: See the new instructions.   nitroGLYCERIN  0.4 MG SL tablet Commonly known as: NITROSTAT  PLACE 1 TABLET UNDER THE TONGUE EVERY 5 MINUTES AS NEEDED FOR CHEST PAIN. What changed: See the new instructions.   pantoprazole  40 MG tablet Commonly known as: PROTONIX  Take 40 mg by mouth daily.   ranolazine  500 MG 12 hr tablet Commonly known as: RANEXA  Take 1 tablet (500 mg total) by mouth 2 (two) times daily.   tamsulosin  0.4 MG Caps capsule Commonly known as: FLOMAX  Take 0.4 mg by mouth daily.   Trulicity 1.5 MG/0.5ML Soaj Generic drug: Dulaglutide Inject 1.5 mg into the skin every Sunday.   zolpidem  5 MG tablet Commonly known as:  AMBIEN  Take 5 mg by mouth at bedtime as needed for sleep.         Outstanding Labs/Studies None  Duration of Discharge Encounter: APP Time: 25 minutes   Signed, Leontine LOISE Salen, PA-C 02/28/2024, 1:08 PM  Agree with note by Leontine Salen, PA-C  Patient transferred from [facility] for increasing dyspnea on exertion.  He is a patient of Dr. Posey.  He had remote CABG back in 2012.  2D echo and stress test a year ago was unremarkable.  Recent 2D echo likewise showed preserved LV function with a  new inferior perfusion defect.  Based on this he underwent right and left heart cath by Dr. Wonda revealing occluded native vessels other than the circumflex which was patent and unbypassed, occluded vein to the distal RCA and diagonal branch with a patent LIMA to his LAD.  There were no targets for revascularization.  Medical therapy was recommended.  He is already on a beta-blocker and Ranexa  as well as Imdur .  His exam is benign.  He is deemed stable for discharge.  Will follow-up with Dr. Revankar.  Dorn DOROTHA Lesches, M.D., FACP, [facility], FAHA, [facility]  [address], [address] [place], [place]  [address]  [phone] 02/28/2024 1:21 PM

## 2024-02-28 NOTE — Progress Notes (Signed)
 Initial Nutrition Assessment  DOCUMENTATION CODES:   Not applicable  INTERVENTION:  -Continue Carb modified diet, thin liquids -Discussed diet recommendations for DM2, heart failure, CKDIII -Discussed importance of adequate kcal/pro intake   NUTRITION DIAGNOSIS:   Limited adherence to nutrition-related recommendations related to other (see comment) (Unwilling or disinterested in: Learning/applying information) as evidenced by per patient/family report.  GOAL:   Patient will meet greater than or equal to 90% of their needs   MONITOR:   PO intake, Supplement acceptance, Skin, Labs, Weight trends  REASON FOR ASSESSMENT:   Malnutrition Screening Tool    ASSESSMENT:   Hx CAD s/p distant prior RCA PCI (1994), distant prior LAD PCI (1996 after tx with t-PA for anterior STEMI), repeat PCI of p/mRCA (1998) repeat LAD+RCA PCI (2002) for ISR with atherectomy, repeat PCI of the p/mRCA for ISR (01/19/03), RCA PCI (2010), LAD ISR/CTO and ISR of the RCA (09/09/10), with subsequent CABG, HTN, HLD, and DM2 who presents as a transfer from [facility] for positive nuclear stress test following evaluation for worsening DOE.  Spoke to pt at bedside. Pt denies n/v/c/d or chewing/swallowing difficulties. Last BM 8/19. Pt states PO intake of 100%, good appetite. Pt states good appetite at baseline. No recent weight loss. Discussed diet recommendations for heart disease, DM2, CKDIII. Pt generally well versed  in recommendations, though, states he does not always follow them. Discussed salt/fluid intake, carbohydrate intake. Pt denies additional questions/concerns at this time, will continue to monitor, RDN available prn. Pt may discharge today.   Labs BG 106-224 BUN 25 Cr 1.75 GFR 39 HDL 39 A1c 6.7  Medications  [START ON 02/29/2024] aspirin  EC  81 mg Oral Daily   clopidogrel   75 mg Oral Daily   ezetimibe   10 mg Oral Daily   heparin   5,000 Units Subcutaneous Q8H   insulin  aspart  0-15 Units  Subcutaneous TID WC   isosorbide  mononitrate  30 mg Oral Daily   losartan   25 mg Oral Daily   metoprolol  tartrate  12.5 mg Oral BID   pantoprazole   40 mg Oral Daily   ranolazine   500 mg Oral BID   sodium chloride  flush  3 mL Intravenous Q12H   tamsulosin   0.4 mg Oral Daily     NUTRITION - FOCUSED PHYSICAL EXAM:  Flowsheet Row Most Recent Value  Orbital Region No depletion  Upper Arm Region No depletion  Thoracic and Lumbar Region No depletion  Buccal Region No depletion  Temple Region No depletion  Clavicle Bone Region No depletion  Clavicle and Acromion Bone Region No depletion  Scapular Bone Region No depletion  Dorsal Hand No depletion  Patellar Region Mild depletion  Anterior Thigh Region Mild depletion  Posterior Calf Region Mild depletion  Edema (RD Assessment) None  Hair Reviewed  Eyes Reviewed  Mouth Reviewed  Skin Reviewed  Nails Reviewed    Diet Order:   Diet Order             Diet - low sodium heart healthy           Diet Carb Modified Fluid consistency: Thin; Room service appropriate? Yes  Diet effective now                   EDUCATION NEEDS:   Education needs have been addressed  Skin:  Skin Assessment: Reviewed RN Assessment  Last BM:  8/19  Height:   Ht Readings from Last 1 Encounters:  02/26/24 5' 8 (1.727 m)    Weight:  Wt Readings from Last 1 Encounters:  02/27/24 82 kg   BMI:  Body mass index is 27.49 kg/m.  Estimated Nutritional Needs:   Kcal:  1650-2050 kcal  Protein:  80-100 g  Fluid:  >/=1.7L  Glenn Roach, RDN, LDN Registered Dietitian Nutritionist RD Inpatient Contact Info in [place]

## 2024-02-28 NOTE — Progress Notes (Signed)
 Patient and dtr both verbalized understanding of dc instructions. All belongings and paperwork given to patient. [facility] notified of dc order. Volunteer services to take patient to the main entrance to meet ride.

## 2024-02-28 NOTE — TOC Transition Note (Signed)
 Transition of Care [facility]) - Discharge Note   Patient Details  Name: Glenn Roach MRN: [id number] Date of Birth: [birth year]  Transition of Care [facility] CM/SW Contact:  Andrez JULIANNA George, RN Phone Number: 02/28/2024, 1:45 PM   Clinical Narrative:     Pt is discharging home with self care. No needs per IP Care management.   Final next level of care: Home/Self Care Barriers to Discharge: No Barriers Identified   Patient Goals and CMS Choice            Discharge Placement                       Discharge Plan and Services Additional resources added to the After Visit Summary for                                       Social Drivers of Health (SDOH) Interventions SDOH Screenings   Food Insecurity: No Food Insecurity (02/26/2024)  Housing: Low Risk  (02/26/2024)  Transportation Needs: No Transportation Needs (02/26/2024)  Utilities: Not At Risk (02/26/2024)  Social Connections: Moderately Isolated (02/26/2024)  Tobacco Use: Medium Risk (02/27/2024)     Readmission Risk Interventions     No data to display

## 2024-02-28 NOTE — Progress Notes (Signed)
 Mobility Specialist Progress Note;   02/28/24 1015  Mobility  Activity Ambulated independently  Level of Assistance Standby assist, set-up cues, supervision of patient - no hands on  Assistive Device None  Distance Ambulated (ft) 350 ft  Activity Response Tolerated well  Mobility Referral Yes  Mobility visit 1 Mobility  Mobility Specialist Start Time (ACUTE ONLY) 1015  Mobility Specialist Stop Time (ACUTE ONLY) 1021  Mobility Specialist Time Calculation (min) (ACUTE ONLY) 6 min   Pt agreeable to mobility. Required no physical assistance during ambulation, SV for safety. C/o mild SOB, however SPO2 WFL during activity. Pt returned back to bed and left with all needs met, call bell in reach.   Lauraine Erm Mobility Specialist Please contact via SecureChat or [employer] [phone]

## 2024-02-29 ENCOUNTER — Telehealth (HOSPITAL_COMMUNITY): Payer: Self-pay

## 2024-02-29 ENCOUNTER — Encounter: Payer: Self-pay | Admitting: Internal Medicine

## 2024-02-29 NOTE — Telephone Encounter (Signed)
Per phase I cardiac rehab, fax referral to [facility].

## 2024-03-14 ENCOUNTER — Other Ambulatory Visit: Payer: Self-pay | Admitting: Cardiology

## 2024-03-19 ENCOUNTER — Ambulatory Visit: Admitting: Cardiology

## 2024-03-26 DIAGNOSIS — Z7982 Long term (current) use of aspirin: Secondary | ICD-10-CM | POA: Diagnosis not present

## 2024-03-26 DIAGNOSIS — Z7902 Long term (current) use of antithrombotics/antiplatelets: Secondary | ICD-10-CM | POA: Diagnosis not present

## 2024-03-26 DIAGNOSIS — Z79899 Other long term (current) drug therapy: Secondary | ICD-10-CM | POA: Diagnosis not present

## 2024-03-26 DIAGNOSIS — Z951 Presence of aortocoronary bypass graft: Secondary | ICD-10-CM | POA: Diagnosis not present

## 2024-03-26 DIAGNOSIS — I2089 Other forms of angina pectoris: Secondary | ICD-10-CM | POA: Diagnosis not present

## 2024-03-26 DIAGNOSIS — I252 Old myocardial infarction: Secondary | ICD-10-CM | POA: Diagnosis not present

## 2024-03-29 DIAGNOSIS — Z7902 Long term (current) use of antithrombotics/antiplatelets: Secondary | ICD-10-CM | POA: Diagnosis not present

## 2024-03-29 DIAGNOSIS — Z951 Presence of aortocoronary bypass graft: Secondary | ICD-10-CM | POA: Diagnosis not present

## 2024-03-29 DIAGNOSIS — Z7982 Long term (current) use of aspirin: Secondary | ICD-10-CM | POA: Diagnosis not present

## 2024-03-29 DIAGNOSIS — Z79899 Other long term (current) drug therapy: Secondary | ICD-10-CM | POA: Diagnosis not present

## 2024-03-29 DIAGNOSIS — I2089 Other forms of angina pectoris: Secondary | ICD-10-CM | POA: Diagnosis not present

## 2024-03-29 DIAGNOSIS — I252 Old myocardial infarction: Secondary | ICD-10-CM | POA: Diagnosis not present

## 2024-04-01 DIAGNOSIS — Z7982 Long term (current) use of aspirin: Secondary | ICD-10-CM | POA: Diagnosis not present

## 2024-04-01 DIAGNOSIS — Z951 Presence of aortocoronary bypass graft: Secondary | ICD-10-CM | POA: Diagnosis not present

## 2024-04-01 DIAGNOSIS — I2089 Other forms of angina pectoris: Secondary | ICD-10-CM | POA: Diagnosis not present

## 2024-04-01 DIAGNOSIS — Z79899 Other long term (current) drug therapy: Secondary | ICD-10-CM | POA: Diagnosis not present

## 2024-04-01 DIAGNOSIS — Z7902 Long term (current) use of antithrombotics/antiplatelets: Secondary | ICD-10-CM | POA: Diagnosis not present

## 2024-04-01 DIAGNOSIS — I252 Old myocardial infarction: Secondary | ICD-10-CM | POA: Diagnosis not present

## 2024-04-03 ENCOUNTER — Ambulatory Visit: Attending: Cardiology | Admitting: Cardiology

## 2024-04-03 DIAGNOSIS — I252 Old myocardial infarction: Secondary | ICD-10-CM | POA: Diagnosis not present

## 2024-04-03 DIAGNOSIS — Z7982 Long term (current) use of aspirin: Secondary | ICD-10-CM | POA: Diagnosis not present

## 2024-04-03 DIAGNOSIS — Z7902 Long term (current) use of antithrombotics/antiplatelets: Secondary | ICD-10-CM | POA: Diagnosis not present

## 2024-04-03 DIAGNOSIS — Z79899 Other long term (current) drug therapy: Secondary | ICD-10-CM | POA: Diagnosis not present

## 2024-04-03 DIAGNOSIS — Z951 Presence of aortocoronary bypass graft: Secondary | ICD-10-CM | POA: Diagnosis not present

## 2024-04-03 DIAGNOSIS — I2089 Other forms of angina pectoris: Secondary | ICD-10-CM | POA: Diagnosis not present

## 2024-04-03 NOTE — Progress Notes (Deleted)
 Cardiology Office Note   Date:  04/03/2024  ID:  Roach, Glenn 04-29-1946, MRN [id number] PCP: Fernand Tracey LABOR, MD  [facility] Providers Cardiologist:  Aubra Pappalardo JONELLE Crape, MD { Click to update primary MD,subspecialty MD or APP then REFRESH:1}    History of Present Illness Glenn Roach is a 78 y.o. male with a past medical history of CAD with multiple PCI's and subsequent CABG 2012,  hypertension, OSA, GERD, DM2, BPH, CKD stage III, dyslipidemia.  02/27/2024 right left heart cath Cto of both vein grafts, patent LIMA to LAD, right heart cath was normal 02/27/2024 echo EF 60-65%, mild concentric LVH, grade 1 DD, mild hypokinesis of the LV, basal mid inferior wall 02/26/2024 Lexiscan  small to medium sized area of partially reversible defect 2012 CABG LIMA to LAD, SVG to posterior lateral branch, SVG to second diagonal  He is a longstanding patient of Dr. Crape, previously followed by Dr. Obie, for the management of CAD with multiple PCI's in 1994 PCI to the RCA>> 1996 >> 1998 >> 2002 >> 2004 >> 2010 >> CABG x 2 in 2012.  He was evaluated at [facility] with chest pain, underwent myocardial perfusion testing which was abnormal consistent with inducible ischemia, EKG without changes, troponins negative >> transferred to [facility] and underwent right left heart cath on 02/27/2024 revealing CTO of both vein grafts, LIMA to LAD was widely patent, due to the chronicity of the disease medical management of his coronary artery disease was suggested.   Cr 1.75, GFR 39, LPA 100.6 ROS: ROS   Studies Reviewed      Cardiac Studies & Procedures   ______________________________________________________________________________________________ CARDIAC CATHETERIZATION  CARDIAC CATHETERIZATION 02/27/2024  Conclusion   Prox RCA to Mid RCA lesion is 100% stenosed.   Origin lesion is 100% stenosed.   Origin lesion is 100% stenosed.   Prox LAD to Mid LAD lesion is 100%  stenosed.   Mid LM to Ost LAD lesion is 40% stenosed.   LIMA.  1.  Severe two-vessel coronary artery disease with total occlusion of the RCA and total occlusion of the LAD, both occluded segments are within the old stents 2.  Patent left main with 40% distal left main stenosis 3.  Patent circumflex with mild diffuse plaquing but no significant stenosis 4.  Continued patency of the LIMA to LAD graft with no stenosis 5.  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  Chronic total occlusion of the RCA collateralized by left-to-right collaterals 7.  Essentially normal right heart catheterization with the following hemodynamics: RA mean 5 mmHg RV 24/4 mmHg PA 22/8 mean 12 mmHg Pulmonary wedge pressure 4 mmHg LVEDP 9 mmHg Cardiac output 3.92 L/min/cardiac index 1.98 L/min/m  Recommendations: Medical therapy for CAD, no targets for intervention.  Right heart pressures are normal/low.  I think the cardiac index data is probably not indicative of a true low output state as the patient appears clinically stable with no normal LV and RV function by noninvasive assessment.  Findings Coronary Findings Diagnostic  Dominance: Right  Left Main Mid LM to Ost LAD lesion is 40% stenosed.  Left Anterior Descending Prox LAD to Mid LAD lesion is 100% stenosed. The lesion was previously treated .  Left Circumflex There is mild diffuse disease throughout the vessel.  Right Coronary Artery Prox RCA to Mid RCA lesion is 100% stenosed. The lesion was previously treated .  Right Posterior  Descending Artery Collaterals RPDA filled by collaterals from Dist Cx.  Collaterals RPDA filled by collaterals from Dist LAD.  Graft To 1st RPL Origin lesion is 100% stenosed.  Graft To 1st Diag Origin lesion is 100% stenosed.  LIMA LIMA Graft To Mid LAD LIMA.  LIMA-LAD is patent  Intervention  No interventions have been  documented.   STRESS TESTS  MYOCARDIAL PERFUSION IMAGING 02/26/2024   ECHOCARDIOGRAM  ECHOCARDIOGRAM COMPLETE 02/27/2024  Narrative ECHOCARDIOGRAM REPORT    Patient Name:   Glenn Roach Date of Exam: 02/27/2024 Medical Rec #:  [id number]        Height:       68.0 in Accession #:    [id number]       Weight:       184.8 lb Date of Birth:  [birth year]       BSA:          1.976 m Patient Age:    6 years         BP:           161/68 mmHg Patient Gender: M                HR:           67 bpm. Exam Location:  Inpatient  Procedure: 2D Echo, Cardiac Doppler and Color Doppler (Both Spectral and Color Flow Doppler were utilized during procedure).  Indications:    Acute ischemic heart disease  History:        Patient has prior history of Echocardiogram examinations, most recent 02/10/2023. CAD, Signs/Symptoms:Dyspnea; Risk Factors:Hypertension, Sleep Apnea and Dyslipidemia. Ckd stage 3.  Sonographer:    Philomena Daring Referring Phys: [id number] MATTHEW A CARLISLE  IMPRESSIONS   1. Left ventricular ejection fraction, by estimation, is 60 to 65%. The left ventricle has normal function. The left ventricle demonstrates regional wall motion abnormalities (see scoring diagram/findings for description). There is mild concentric left ventricular hypertrophy. Left ventricular diastolic parameters are consistent with Grade I diastolic dysfunction (impaired relaxation). There is mild hypokinesis of the left ventricular, basal-mid inferior wall. 2. Right ventricular systolic function is normal. The right ventricular size is normal. There is normal pulmonary artery systolic pressure. The estimated right ventricular systolic pressure is 16.1 mmHg. 3. The mitral valve is normal in structure. No evidence of mitral valve regurgitation. No evidence of mitral stenosis. 4. The aortic valve is tricuspid. Aortic valve regurgitation is trivial. Aortic valve sclerosis is present, with no evidence of aortic valve  stenosis. 5. The inferior vena cava is normal in size with greater than 50% respiratory variability, suggesting right atrial pressure of 3 mmHg.  Comparison(s): Prior images reviewed side by side. Both studies are technically difficult. There is a suggestion of inferobasal hypokinesis that was not seen on the previous echo, but confidence is limited.  FINDINGS Left Ventricle: Left ventricular ejection fraction, by estimation, is 60 to 65%. The left ventricle has normal function. The left ventricle demonstrates regional wall motion abnormalities. Mild hypokinesis of the left ventricular, basal-mid inferior wall. The left ventricular internal cavity size was normal in size. There is mild concentric left ventricular hypertrophy. Left ventricular diastolic parameters are consistent with Grade I diastolic dysfunction (impaired relaxation).  Right Ventricle: The right ventricular size is normal. No increase in right ventricular wall thickness. Right ventricular systolic function is normal. There is normal pulmonary artery systolic pressure. The tricuspid regurgitant velocity is 1.81 m/s, and with an assumed right atrial pressure of 3 mmHg, the estimated right  ventricular systolic pressure is 16.1 mmHg.  Left Atrium: Left atrial size was normal in size.  Right Atrium: Right atrial size was normal in size.  Pericardium: There is no evidence of pericardial effusion.  Mitral Valve: The mitral valve is normal in structure. Mild mitral annular calcification. No evidence of mitral valve regurgitation. No evidence of mitral valve stenosis.  Tricuspid Valve: The tricuspid valve is normal in structure. Tricuspid valve regurgitation is trivial.  Aortic Valve: The aortic valve is tricuspid. Aortic valve regurgitation is trivial. Aortic valve sclerosis is present, with no evidence of aortic valve stenosis.  Pulmonic Valve: The pulmonic valve was not well visualized. Pulmonic valve regurgitation is not  visualized. No evidence of pulmonic stenosis.  Aorta: The aortic root is normal in size and structure.  Venous: The inferior vena cava is normal in size with greater than 50% respiratory variability, suggesting right atrial pressure of 3 mmHg.  IAS/Shunts: No atrial level shunt detected by color flow Doppler.   LEFT VENTRICLE PLAX 2D LVIDd:         4.51 cm      Diastology LVIDs:         2.91 cm      LV e' medial:    6.96 cm/s LV PW:         1.23 cm      LV E/e' medial:  12.9 LV IVS:        1.26 cm      LV e' lateral:   9.79 cm/s LVOT diam:     2.29 cm      LV E/e' lateral: 9.2 LV SV:         79 LV SV Index:   40 LVOT Area:     4.12 cm  LV Volumes (MOD) LV vol d, MOD A2C: 93.4 ml LV vol d, MOD A4C: 120.0 ml LV vol s, MOD A2C: 33.0 ml LV vol s, MOD A4C: 40.3 ml LV SV MOD A2C:     60.4 ml LV SV MOD A4C:     120.0 ml LV SV MOD BP:      72.3 ml  RIGHT VENTRICLE             IVC RV S prime:     10.30 cm/s  IVC diam: 1.33 cm TAPSE (M-mode): 1.8 cm  LEFT ATRIUM             Index        RIGHT ATRIUM           Index LA diam:        3.72 cm 1.88 cm/m   RA Area:     16.80 cm LA Vol (A2C):   42.4 ml 21.46 ml/m  RA Volume:   39.70 ml  20.09 ml/m LA Vol (A4C):   43.8 ml 22.16 ml/m LA Biplane Vol: 43.9 ml 22.21 ml/m AORTIC VALVE LVOT Vmax:   78.20 cm/s LVOT Vmean:  55.700 cm/s LVOT VTI:    0.193 m  AORTA Ao Root diam: 2.47 cm Ao Asc diam:  3.06 cm  MITRAL VALVE               TRICUSPID VALVE MV Area (PHT): 3.07 cm    TR Peak grad:   13.1 mmHg MV Decel Time: 247 msec    TR Vmax:        181.00 cm/s MV E velocity: 89.60 cm/s MV A velocity: 99.00 cm/s  SHUNTS MV E/A ratio:  0.91  Systemic VTI:  0.19 m Systemic Diam: 2.29 cm  Jerel Croitoru MD Electronically signed by Jerel Balding MD Signature Date/Time: 02/27/2024/2:23:45 PM    Final          ______________________________________________________________________________________________      Risk  Assessment/Calculations {Does this patient have ATRIAL FIBRILLATION?:[phone]} No BP recorded.  {Refresh Note OR Click here to enter BP  :1}***       Physical Exam VS:  There were no vitals taken for this visit.       Wt Readings from Last 3 Encounters:  02/27/24 180 lb 12.8 oz (82 kg)  01/24/23 191 lb (86.6 kg)  01/17/23 191 lb 9.6 oz (86.9 kg)    GEN: Well nourished, well developed in no acute distress NECK: No JVD; No carotid bruits CARDIAC: ***RRR, no murmurs, rubs, gallops RESPIRATORY:  Clear to auscultation without rales, wheezing or rhonchi  ABDOMEN: Soft, non-tender, non-distended EXTREMITIES:  No edema; No deformity   ASSESSMENT AND PLAN CAD -      {Are you ordering a CV Procedure (e.g. stress test, cath, DCCV, TEE, etc)?   Press F2        :[id number]}  Dispo: ***  Signed, Delon JAYSON Hoover, NP

## 2024-04-05 DIAGNOSIS — Z7982 Long term (current) use of aspirin: Secondary | ICD-10-CM | POA: Diagnosis not present

## 2024-04-05 DIAGNOSIS — Z7902 Long term (current) use of antithrombotics/antiplatelets: Secondary | ICD-10-CM | POA: Diagnosis not present

## 2024-04-05 DIAGNOSIS — I252 Old myocardial infarction: Secondary | ICD-10-CM | POA: Diagnosis not present

## 2024-04-05 DIAGNOSIS — I2089 Other forms of angina pectoris: Secondary | ICD-10-CM | POA: Diagnosis not present

## 2024-04-05 DIAGNOSIS — Z951 Presence of aortocoronary bypass graft: Secondary | ICD-10-CM | POA: Diagnosis not present

## 2024-04-05 DIAGNOSIS — Z79899 Other long term (current) drug therapy: Secondary | ICD-10-CM | POA: Diagnosis not present

## 2024-04-08 DIAGNOSIS — I2089 Other forms of angina pectoris: Secondary | ICD-10-CM | POA: Diagnosis not present

## 2024-04-08 DIAGNOSIS — Z7902 Long term (current) use of antithrombotics/antiplatelets: Secondary | ICD-10-CM | POA: Diagnosis not present

## 2024-04-08 DIAGNOSIS — Z79899 Other long term (current) drug therapy: Secondary | ICD-10-CM | POA: Diagnosis not present

## 2024-04-08 DIAGNOSIS — I252 Old myocardial infarction: Secondary | ICD-10-CM | POA: Diagnosis not present

## 2024-04-08 DIAGNOSIS — Z7982 Long term (current) use of aspirin: Secondary | ICD-10-CM | POA: Diagnosis not present

## 2024-04-08 DIAGNOSIS — Z951 Presence of aortocoronary bypass graft: Secondary | ICD-10-CM | POA: Diagnosis not present

## 2024-04-10 ENCOUNTER — Other Ambulatory Visit: Payer: Self-pay | Admitting: Cardiology

## 2024-04-12 DIAGNOSIS — I2089 Other forms of angina pectoris: Secondary | ICD-10-CM | POA: Diagnosis not present

## 2024-04-12 DIAGNOSIS — I252 Old myocardial infarction: Secondary | ICD-10-CM | POA: Diagnosis not present

## 2024-04-12 DIAGNOSIS — Z7902 Long term (current) use of antithrombotics/antiplatelets: Secondary | ICD-10-CM | POA: Diagnosis not present

## 2024-04-12 DIAGNOSIS — Z7982 Long term (current) use of aspirin: Secondary | ICD-10-CM | POA: Diagnosis not present

## 2024-04-12 DIAGNOSIS — Z79899 Other long term (current) drug therapy: Secondary | ICD-10-CM | POA: Diagnosis not present

## 2024-04-12 DIAGNOSIS — Z951 Presence of aortocoronary bypass graft: Secondary | ICD-10-CM | POA: Diagnosis not present

## 2024-04-15 DIAGNOSIS — Z7902 Long term (current) use of antithrombotics/antiplatelets: Secondary | ICD-10-CM | POA: Diagnosis not present

## 2024-04-15 DIAGNOSIS — Z79899 Other long term (current) drug therapy: Secondary | ICD-10-CM | POA: Diagnosis not present

## 2024-04-15 DIAGNOSIS — Z7982 Long term (current) use of aspirin: Secondary | ICD-10-CM | POA: Diagnosis not present

## 2024-04-15 DIAGNOSIS — I2089 Other forms of angina pectoris: Secondary | ICD-10-CM | POA: Diagnosis not present

## 2024-04-15 DIAGNOSIS — Z951 Presence of aortocoronary bypass graft: Secondary | ICD-10-CM | POA: Diagnosis not present

## 2024-04-15 DIAGNOSIS — I252 Old myocardial infarction: Secondary | ICD-10-CM | POA: Diagnosis not present

## 2024-04-17 DIAGNOSIS — I2089 Other forms of angina pectoris: Secondary | ICD-10-CM | POA: Diagnosis not present

## 2024-04-17 DIAGNOSIS — I252 Old myocardial infarction: Secondary | ICD-10-CM | POA: Diagnosis not present

## 2024-04-17 DIAGNOSIS — Z7902 Long term (current) use of antithrombotics/antiplatelets: Secondary | ICD-10-CM | POA: Diagnosis not present

## 2024-04-17 DIAGNOSIS — Z7982 Long term (current) use of aspirin: Secondary | ICD-10-CM | POA: Diagnosis not present

## 2024-04-17 DIAGNOSIS — Z79899 Other long term (current) drug therapy: Secondary | ICD-10-CM | POA: Diagnosis not present

## 2024-04-17 DIAGNOSIS — Z951 Presence of aortocoronary bypass graft: Secondary | ICD-10-CM | POA: Diagnosis not present

## 2024-04-18 ENCOUNTER — Other Ambulatory Visit: Payer: Self-pay | Admitting: Cardiology

## 2024-04-19 DIAGNOSIS — Z951 Presence of aortocoronary bypass graft: Secondary | ICD-10-CM | POA: Diagnosis not present

## 2024-04-19 DIAGNOSIS — Z7982 Long term (current) use of aspirin: Secondary | ICD-10-CM | POA: Diagnosis not present

## 2024-04-19 DIAGNOSIS — Z7902 Long term (current) use of antithrombotics/antiplatelets: Secondary | ICD-10-CM | POA: Diagnosis not present

## 2024-04-19 DIAGNOSIS — Z79899 Other long term (current) drug therapy: Secondary | ICD-10-CM | POA: Diagnosis not present

## 2024-04-19 DIAGNOSIS — I252 Old myocardial infarction: Secondary | ICD-10-CM | POA: Diagnosis not present

## 2024-04-19 DIAGNOSIS — I2089 Other forms of angina pectoris: Secondary | ICD-10-CM | POA: Diagnosis not present

## 2024-04-22 DIAGNOSIS — I252 Old myocardial infarction: Secondary | ICD-10-CM | POA: Diagnosis not present

## 2024-04-22 DIAGNOSIS — Z7982 Long term (current) use of aspirin: Secondary | ICD-10-CM | POA: Diagnosis not present

## 2024-04-22 DIAGNOSIS — Z79899 Other long term (current) drug therapy: Secondary | ICD-10-CM | POA: Diagnosis not present

## 2024-04-22 DIAGNOSIS — I2089 Other forms of angina pectoris: Secondary | ICD-10-CM | POA: Diagnosis not present

## 2024-04-22 DIAGNOSIS — Z7902 Long term (current) use of antithrombotics/antiplatelets: Secondary | ICD-10-CM | POA: Diagnosis not present

## 2024-04-22 DIAGNOSIS — Z951 Presence of aortocoronary bypass graft: Secondary | ICD-10-CM | POA: Diagnosis not present

## 2024-04-24 DIAGNOSIS — I252 Old myocardial infarction: Secondary | ICD-10-CM | POA: Diagnosis not present

## 2024-04-24 DIAGNOSIS — Z79899 Other long term (current) drug therapy: Secondary | ICD-10-CM | POA: Diagnosis not present

## 2024-04-24 DIAGNOSIS — Z7982 Long term (current) use of aspirin: Secondary | ICD-10-CM | POA: Diagnosis not present

## 2024-04-24 DIAGNOSIS — Z7902 Long term (current) use of antithrombotics/antiplatelets: Secondary | ICD-10-CM | POA: Diagnosis not present

## 2024-04-24 DIAGNOSIS — I2089 Other forms of angina pectoris: Secondary | ICD-10-CM | POA: Diagnosis not present

## 2024-04-24 DIAGNOSIS — Z951 Presence of aortocoronary bypass graft: Secondary | ICD-10-CM | POA: Diagnosis not present

## 2024-04-26 ENCOUNTER — Other Ambulatory Visit: Payer: Self-pay | Admitting: Cardiology

## 2024-04-26 DIAGNOSIS — Z79899 Other long term (current) drug therapy: Secondary | ICD-10-CM | POA: Diagnosis not present

## 2024-04-26 DIAGNOSIS — Z7902 Long term (current) use of antithrombotics/antiplatelets: Secondary | ICD-10-CM | POA: Diagnosis not present

## 2024-04-26 DIAGNOSIS — Z7982 Long term (current) use of aspirin: Secondary | ICD-10-CM | POA: Diagnosis not present

## 2024-04-26 DIAGNOSIS — Z951 Presence of aortocoronary bypass graft: Secondary | ICD-10-CM | POA: Diagnosis not present

## 2024-04-26 DIAGNOSIS — I2089 Other forms of angina pectoris: Secondary | ICD-10-CM | POA: Diagnosis not present

## 2024-04-26 DIAGNOSIS — I252 Old myocardial infarction: Secondary | ICD-10-CM | POA: Diagnosis not present

## 2024-04-29 DIAGNOSIS — Z951 Presence of aortocoronary bypass graft: Secondary | ICD-10-CM | POA: Diagnosis not present

## 2024-04-29 DIAGNOSIS — Z7902 Long term (current) use of antithrombotics/antiplatelets: Secondary | ICD-10-CM | POA: Diagnosis not present

## 2024-04-29 DIAGNOSIS — Z7982 Long term (current) use of aspirin: Secondary | ICD-10-CM | POA: Diagnosis not present

## 2024-04-29 DIAGNOSIS — I252 Old myocardial infarction: Secondary | ICD-10-CM | POA: Diagnosis not present

## 2024-04-29 DIAGNOSIS — Z79899 Other long term (current) drug therapy: Secondary | ICD-10-CM | POA: Diagnosis not present

## 2024-04-29 DIAGNOSIS — I2089 Other forms of angina pectoris: Secondary | ICD-10-CM | POA: Diagnosis not present

## 2024-04-30 ENCOUNTER — Encounter: Payer: Self-pay | Admitting: Cardiology

## 2024-04-30 ENCOUNTER — Other Ambulatory Visit: Payer: Self-pay

## 2024-04-30 ENCOUNTER — Ambulatory Visit: Attending: Cardiology | Admitting: Cardiology

## 2024-04-30 VITALS — BP 120/60 | HR 78 | Ht 69.0 in | Wt 187.6 lb

## 2024-04-30 DIAGNOSIS — E088 Diabetes mellitus due to underlying condition with unspecified complications: Secondary | ICD-10-CM | POA: Insufficient documentation

## 2024-04-30 DIAGNOSIS — E782 Mixed hyperlipidemia: Secondary | ICD-10-CM | POA: Diagnosis not present

## 2024-04-30 DIAGNOSIS — N1831 Chronic kidney disease, stage 3a: Secondary | ICD-10-CM | POA: Diagnosis not present

## 2024-04-30 DIAGNOSIS — I25708 Atherosclerosis of coronary artery bypass graft(s), unspecified, with other forms of angina pectoris: Secondary | ICD-10-CM | POA: Insufficient documentation

## 2024-04-30 DIAGNOSIS — I10 Essential (primary) hypertension: Secondary | ICD-10-CM | POA: Insufficient documentation

## 2024-04-30 DIAGNOSIS — G4730 Sleep apnea, unspecified: Secondary | ICD-10-CM | POA: Insufficient documentation

## 2024-04-30 DIAGNOSIS — I2510 Atherosclerotic heart disease of native coronary artery without angina pectoris: Secondary | ICD-10-CM | POA: Diagnosis not present

## 2024-04-30 MED ORDER — FENOFIBRATE 160 MG PO TABS: 160.0000 mg | ORAL_TABLET | Freq: Every day | ORAL | 3 refills | Status: AC

## 2024-04-30 MED ORDER — METOPROLOL TARTRATE 25 MG PO TABS: 12.5000 mg | ORAL_TABLET | Freq: Two times a day (BID) | ORAL | 3 refills | Status: AC

## 2024-04-30 MED ORDER — RANOLAZINE ER 500 MG PO TB12: 500.0000 mg | ORAL_TABLET | Freq: Two times a day (BID) | ORAL | 3 refills | Status: AC

## 2024-04-30 MED ORDER — LOSARTAN POTASSIUM 25 MG PO TABS: 25.0000 mg | ORAL_TABLET | Freq: Every day | ORAL | 3 refills | Status: AC

## 2024-04-30 MED ORDER — CLOPIDOGREL BISULFATE 75 MG PO TABS: 75.0000 mg | ORAL_TABLET | Freq: Every day | ORAL | 3 refills | Status: AC

## 2024-04-30 MED ORDER — EZETIMIBE 10 MG PO TABS: 10.0000 mg | ORAL_TABLET | Freq: Every day | ORAL | 3 refills | Status: AC

## 2024-04-30 MED ORDER — LIVALO 2 MG PO TABS: 2.0000 mg | ORAL_TABLET | Freq: Every day | ORAL | 3 refills | Status: AC

## 2024-04-30 MED ORDER — ISOSORBIDE MONONITRATE ER 30 MG PO TB24: 30.0000 mg | ORAL_TABLET | Freq: Every day | ORAL | 3 refills | Status: AC

## 2024-04-30 MED ORDER — NITROGLYCERIN 0.4 MG SL SUBL: 0.4000 mg | SUBLINGUAL_TABLET | SUBLINGUAL | 11 refills | Status: AC | PRN

## 2024-04-30 NOTE — Patient Instructions (Signed)

## 2024-04-30 NOTE — Addendum Note (Signed)
 Addended by: Chesley Veasey, ANNABELLA L on: 04/30/2024 02:12 PM   Modules accepted: Orders

## 2024-04-30 NOTE — Progress Notes (Signed)
 Cardiology Office Note:    Date:  04/30/2024   ID:  Glenn Roach, DOB 1946/02/14, MRN [id number]  PCP:  Fernand Tracey LABOR, MD  Cardiologist:  Jennifer JONELLE Crape, MD   Referring MD: Fernand Tracey LABOR, MD    ASSESSMENT:    1. Atherosclerosis of coronary artery bypass graft of native heart with stable angina pectoris   2. Coronary artery disease involving native coronary artery of native heart without angina pectoris   3. Essential hypertension   4. Sleep apnea, unspecified type   5. Diabetes mellitus due to underlying condition with unspecified complications (HCC)   6. Stage 3a chronic kidney disease (HCC)   7. Mixed dyslipidemia    PLAN:    In order of problems listed above:  Coronary artery disease: Second prevention stressed with the patient.  Importance of compliance with diet medication stressed and patient verbalized standing.  He is asymptomatic from a cardiac standpoint.  I told him to split his blood pressure medications and take half of them before and half after cardiac rehab he is agreeable. Essential hypertension: Blood pressure is stable and diet was emphasized.  Lifestyle modification urged. Mixed dyslipidemia: On lipid-lowering medications followed by primary care.  Goal LDL should be less than 60. Diabetes mellitus: I discussed diet.  Risks of obesity explained weight reduction stressed and he promises to do better. Patient will be seen in follow-up appointment in 6 months or earlier if the patient has any concerns.    Medication Adjustments/Labs and Tests Ordered: Current medicines are reviewed at length with the patient today.  Concerns regarding medicines are outlined above.  No orders of the defined types were placed in this encounter.  No orders of the defined types were placed in this encounter.    No chief complaint on file.    History of Present Illness:    Glenn Roach is a 78 y.o. male.  Patient has past medical history of coronary artery disease  post CABG surgery, essential hypertension, mixed dyslipidemia and diabetes mellitus.  He has renal insufficiency.  He takes care of activities of daily living.  He is in cardiac rehab.  He mentions to me that his blood pressure dropped after cardiac rehab on 2 occasions but was asymptomatic.  At the time of my evaluation, the patient is alert awake oriented and in no distress.  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 10/28/2008   Qualifier: Diagnosis of   By: Justina Kos         BPH (benign prostatic hyperplasia) 03/07/2022   CAD (coronary artery disease)    Chest pain 02/27/2024   Chest tightness    CKD (chronic kidney disease) stage 3, GFR 30-59 ml/min (HCC) 02/03/2022   Coronary artery disease involving coronary bypass graft of native heart with angina pectoris 10/28/2008   Qualifier: Diagnosis of  By: Jaramillo, Luz     Diabetes mellitus due to underlying condition with unspecified complications (HCC) 10/28/2008   Qualifier: Diagnosis of  By: Jaramillo, Luz     Displaced trimalleolar fracture of left ankle 03/07/2022   Dyslipidemia    Dyspnea on exertion 02/27/2024   Essential hypertension 04/14/2015   GERD (gastroesophageal reflux disease)    History of kidney stones    Mild renal insufficiency 04/14/2015   Mixed dyslipidemia 01/19/2017   NEPHROLITHIASIS, HX OF 10/28/2008   Qualifier: Diagnosis  of  By: Jaramillo, Luz     Non morbid obesity due to excess calories 04/14/2015   Sleep apnea    Trimalleolar fracture of ankle, closed, left, initial encounter 02/03/2022   Type 2 diabetes mellitus without complication, with long-term current use of insulin  (HCC) 03/07/2022    Past Surgical History:  Procedure Laterality Date   CORONARY ARTERY BYPASS GRAFT     FINGER SURGERY Right    3rd   FINGER SURGERY Left    Thumb   ORIF ANKLE  FRACTURE Left 02/03/2022   Procedure: OPEN REDUCTION INTERNAL FIXATION (ORIF) ANKLE FRACTURE TRIMALLEOLAR FRACTURE;  Surgeon: Kit Rush, MD;  Location: [facility] OR;  Service: Orthopedics;  Laterality: Left;   RIGHT/LEFT HEART CATH AND CORONARY/GRAFT ANGIOGRAPHY N/A 02/27/2024   Procedure: RIGHT/LEFT HEART CATH AND CORONARY/GRAFT ANGIOGRAPHY;  Surgeon: Wonda Sharper, MD;  Location: [facility] INVASIVE CV LAB;  Service: Cardiovascular;  Laterality: N/A;    Current Medications: Current Meds  Medication Sig   alendronate (FOSAMAX) 70 MG tablet Take 70 mg by mouth once a week. Take with a full glass of water  on an empty stomach.   clopidogrel  (PLAVIX ) 75 MG tablet TAKE 1 TABLET BY MOUTH DAILY.   Coenzyme Q-10 200 MG CAPS Take 200 mg by mouth daily.    ezetimibe  (ZETIA ) 10 MG tablet TAKE 1 TABLET BY MOUTH DAILY.   fenofibrate  160 MG tablet Take 1 tablet (160 mg total) by mouth daily. Please keep upcoming appointment with Dr. Dulcy Sida on 10/21 in order to receive future refills. Thank You.   glimepiride (AMARYL) 2 MG tablet Take 2 mg by mouth daily with breakfast.   isosorbide  mononitrate (IMDUR ) 30 MG 24 hr tablet TAKE 1 TABLET BY MOUTH DAILY.   JARDIANCE 10 MG TABS tablet Take 10 mg by mouth every morning.   LIVALO  2 MG TABS Take 1 tablet (2 mg total) by mouth daily.   losartan  (COZAAR ) 25 MG tablet Take 25 mg by mouth daily.   Melatonin 10 MG TBCR Take 10 mg by mouth at bedtime as needed (sleep).   metoprolol  tartrate (LOPRESSOR ) 25 MG tablet TAKE 1/2 TABLET BY MOUTH 2 TIMES DAILY.   Multiple Vitamins-Minerals (CENTRUM SILVER PO) Take 1 tablet by mouth daily.   nitroGLYCERIN  (NITROSTAT ) 0.4 MG SL tablet PLACE 1 TABLET UNDER THE TONGUE EVERY 5 MINUTES AS NEEDED FOR CHEST PAIN.   Omega-3 Fatty Acids (FISH OIL TRIPLE STRENGTH) 1400 MG CAPS Take 1,400 mg by mouth daily.   pantoprazole  (PROTONIX ) 40 MG tablet Take 40 mg by mouth daily.    ranolazine  (RANEXA ) 500 MG 12 hr tablet TAKE 1 TABLET BY MOUTH TWO TIMES  DAILY.   tamsulosin  (FLOMAX ) 0.4 MG CAPS capsule Take 0.4 mg by mouth daily.   TRULICITY 1.5 MG/0.5ML SOPN Inject 1.5 mg into the skin every Sunday.   zolpidem  (AMBIEN ) 5 MG tablet Take 5 mg by mouth at bedtime as needed for sleep.     Allergies:   Ticlopidine hcl, Crestor [rosuvastatin], Morphine , and Ramipril   Social History   Socioeconomic History   Marital status: Married    Spouse name: Not on file   Number of children: Not on file   Years of education: Not on file   Highest education level: Not on file  Occupational History   Not on file  Tobacco Use   Smoking status: Former   Smokeless tobacco: Never  Vaping Use   Vaping status: Never Used  Substance and Sexual Activity   Alcohol use: No  Drug use: No   Sexual activity: Not on file  Other Topics Concern   Not on file  Social History Narrative   Not on file   Social Drivers of Health   Financial Resource Strain: Not on file  Food Insecurity: No Food Insecurity (02/26/2024)   Hunger Vital Sign    Worried About Running Out of Food in the Last Year: Never true    Ran Out of Food in the Last Year: Never true  Transportation Needs: No Transportation Needs (02/26/2024)   PRAPARE - Administrator, Civil Service (Medical): No    Lack of Transportation (Non-Medical): No  Physical Activity: Not on file  Stress: Not on file  Social Connections: Moderately Isolated (02/26/2024)   Social Connection and Isolation Panel    Frequency of Communication with Friends and Family: Three times a week    Frequency of Social Gatherings with Friends and Family: Never    Attends Religious Services: Never    Database administrator or Organizations: No    Attends Engineer, structural: Never    Marital Status: Married     Family History: The patient's family history includes Heart attack in his father; Heart disease in his mother; Heart failure in his mother; Stroke in his father.  ROS:   Please see the history  of present illness.    All other systems reviewed and are negative.  EKGs/Labs/Other Studies Reviewed:    The following studies were reviewed today: I discussed my findings with the patient at length   Recent Labs: 02/27/2024: TSH 1.126 02/28/2024: BUN 25; Creatinine, Ser 1.75; Hemoglobin 13.4; Platelets 195; Potassium 4.7; Sodium 141  Recent Lipid Panel    Component Value Date/Time   CHOL 122 02/27/2024 0506   CHOL 146 06/15/2021 1044   TRIG 147 02/27/2024 0506   HDL 39 (L) 02/27/2024 0506   HDL 51 06/15/2021 1044   CHOLHDL 3.1 02/27/2024 0506   VLDL 29 02/27/2024 0506   LDLCALC 54 02/27/2024 0506   LDLCALC 75 06/15/2021 1044    Physical Exam:    VS:  BP 120/60   Pulse 78   Ht 5' 9 (1.753 m)   Wt 187 lb 9.6 oz (85.1 kg)   SpO2 97%   BMI 27.70 kg/m     Wt Readings from Last 3 Encounters:  04/30/24 187 lb 9.6 oz (85.1 kg)  02/27/24 180 lb 12.8 oz (82 kg)  01/24/23 191 lb (86.6 kg)     GEN: Patient is in no acute distress HEENT: Normal NECK: No JVD; No carotid bruits LYMPHATICS: No lymphadenopathy CARDIAC: Hear sounds regular, 2/6 systolic murmur at the apex. RESPIRATORY:  Clear to auscultation without rales, wheezing or rhonchi  ABDOMEN: Soft, non-tender, non-distended MUSCULOSKELETAL:  No edema; No deformity  SKIN: Warm and dry NEUROLOGIC:  Alert and oriented x 3 PSYCHIATRIC:  Normal affect   Signed, Jennifer JONELLE Crape, MD  04/30/2024 1:39 PM    [facility]

## 2024-05-01 DIAGNOSIS — Z951 Presence of aortocoronary bypass graft: Secondary | ICD-10-CM | POA: Diagnosis not present

## 2024-05-01 DIAGNOSIS — Z7982 Long term (current) use of aspirin: Secondary | ICD-10-CM | POA: Diagnosis not present

## 2024-05-01 DIAGNOSIS — Z7902 Long term (current) use of antithrombotics/antiplatelets: Secondary | ICD-10-CM | POA: Diagnosis not present

## 2024-05-01 DIAGNOSIS — Z79899 Other long term (current) drug therapy: Secondary | ICD-10-CM | POA: Diagnosis not present

## 2024-05-01 DIAGNOSIS — I2089 Other forms of angina pectoris: Secondary | ICD-10-CM | POA: Diagnosis not present

## 2024-05-01 DIAGNOSIS — I252 Old myocardial infarction: Secondary | ICD-10-CM | POA: Diagnosis not present

## 2024-05-03 DIAGNOSIS — Z7902 Long term (current) use of antithrombotics/antiplatelets: Secondary | ICD-10-CM | POA: Diagnosis not present

## 2024-05-03 DIAGNOSIS — I252 Old myocardial infarction: Secondary | ICD-10-CM | POA: Diagnosis not present

## 2024-05-03 DIAGNOSIS — Z7982 Long term (current) use of aspirin: Secondary | ICD-10-CM | POA: Diagnosis not present

## 2024-05-03 DIAGNOSIS — Z951 Presence of aortocoronary bypass graft: Secondary | ICD-10-CM | POA: Diagnosis not present

## 2024-05-03 DIAGNOSIS — Z79899 Other long term (current) drug therapy: Secondary | ICD-10-CM | POA: Diagnosis not present

## 2024-05-03 DIAGNOSIS — I2089 Other forms of angina pectoris: Secondary | ICD-10-CM | POA: Diagnosis not present

## 2024-05-06 DIAGNOSIS — I2089 Other forms of angina pectoris: Secondary | ICD-10-CM | POA: Diagnosis not present

## 2024-05-06 DIAGNOSIS — I252 Old myocardial infarction: Secondary | ICD-10-CM | POA: Diagnosis not present

## 2024-05-06 DIAGNOSIS — Z951 Presence of aortocoronary bypass graft: Secondary | ICD-10-CM | POA: Diagnosis not present

## 2024-05-06 DIAGNOSIS — Z79899 Other long term (current) drug therapy: Secondary | ICD-10-CM | POA: Diagnosis not present

## 2024-05-06 DIAGNOSIS — Z7982 Long term (current) use of aspirin: Secondary | ICD-10-CM | POA: Diagnosis not present

## 2024-05-06 DIAGNOSIS — Z7902 Long term (current) use of antithrombotics/antiplatelets: Secondary | ICD-10-CM | POA: Diagnosis not present

## 2024-05-07 DIAGNOSIS — F5102 Adjustment insomnia: Secondary | ICD-10-CM | POA: Diagnosis not present

## 2024-05-07 DIAGNOSIS — Z23 Encounter for immunization: Secondary | ICD-10-CM | POA: Diagnosis not present

## 2024-05-07 DIAGNOSIS — E669 Obesity, unspecified: Secondary | ICD-10-CM | POA: Diagnosis not present

## 2024-05-07 DIAGNOSIS — G4733 Obstructive sleep apnea (adult) (pediatric): Secondary | ICD-10-CM | POA: Diagnosis not present

## 2024-05-07 DIAGNOSIS — E1169 Type 2 diabetes mellitus with other specified complication: Secondary | ICD-10-CM | POA: Diagnosis not present

## 2024-05-08 DIAGNOSIS — I2089 Other forms of angina pectoris: Secondary | ICD-10-CM | POA: Diagnosis not present

## 2024-05-08 DIAGNOSIS — I252 Old myocardial infarction: Secondary | ICD-10-CM | POA: Diagnosis not present

## 2024-05-08 DIAGNOSIS — Z79899 Other long term (current) drug therapy: Secondary | ICD-10-CM | POA: Diagnosis not present

## 2024-05-08 DIAGNOSIS — Z7902 Long term (current) use of antithrombotics/antiplatelets: Secondary | ICD-10-CM | POA: Diagnosis not present

## 2024-05-08 DIAGNOSIS — Z7982 Long term (current) use of aspirin: Secondary | ICD-10-CM | POA: Diagnosis not present

## 2024-05-08 DIAGNOSIS — Z951 Presence of aortocoronary bypass graft: Secondary | ICD-10-CM | POA: Diagnosis not present

## 2024-05-10 DIAGNOSIS — Z951 Presence of aortocoronary bypass graft: Secondary | ICD-10-CM | POA: Diagnosis not present

## 2024-05-10 DIAGNOSIS — Z7902 Long term (current) use of antithrombotics/antiplatelets: Secondary | ICD-10-CM | POA: Diagnosis not present

## 2024-05-10 DIAGNOSIS — I2089 Other forms of angina pectoris: Secondary | ICD-10-CM | POA: Diagnosis not present

## 2024-05-10 DIAGNOSIS — Z79899 Other long term (current) drug therapy: Secondary | ICD-10-CM | POA: Diagnosis not present

## 2024-05-10 DIAGNOSIS — I252 Old myocardial infarction: Secondary | ICD-10-CM | POA: Diagnosis not present

## 2024-05-10 DIAGNOSIS — Z7982 Long term (current) use of aspirin: Secondary | ICD-10-CM | POA: Diagnosis not present

## 2024-05-13 DIAGNOSIS — Z951 Presence of aortocoronary bypass graft: Secondary | ICD-10-CM | POA: Diagnosis not present

## 2024-05-13 DIAGNOSIS — I252 Old myocardial infarction: Secondary | ICD-10-CM | POA: Diagnosis not present

## 2024-05-13 DIAGNOSIS — Z7902 Long term (current) use of antithrombotics/antiplatelets: Secondary | ICD-10-CM | POA: Diagnosis not present

## 2024-05-13 DIAGNOSIS — Z7982 Long term (current) use of aspirin: Secondary | ICD-10-CM | POA: Diagnosis not present

## 2024-05-13 DIAGNOSIS — Z79899 Other long term (current) drug therapy: Secondary | ICD-10-CM | POA: Diagnosis not present

## 2024-05-13 DIAGNOSIS — I2089 Other forms of angina pectoris: Secondary | ICD-10-CM | POA: Diagnosis not present

## 2024-05-15 DIAGNOSIS — I252 Old myocardial infarction: Secondary | ICD-10-CM | POA: Diagnosis not present

## 2024-05-15 DIAGNOSIS — Z7982 Long term (current) use of aspirin: Secondary | ICD-10-CM | POA: Diagnosis not present

## 2024-05-15 DIAGNOSIS — Z79899 Other long term (current) drug therapy: Secondary | ICD-10-CM | POA: Diagnosis not present

## 2024-05-15 DIAGNOSIS — I2089 Other forms of angina pectoris: Secondary | ICD-10-CM | POA: Diagnosis not present

## 2024-05-15 DIAGNOSIS — Z951 Presence of aortocoronary bypass graft: Secondary | ICD-10-CM | POA: Diagnosis not present

## 2024-05-15 DIAGNOSIS — Z7902 Long term (current) use of antithrombotics/antiplatelets: Secondary | ICD-10-CM | POA: Diagnosis not present

## 2024-05-17 DIAGNOSIS — I252 Old myocardial infarction: Secondary | ICD-10-CM | POA: Diagnosis not present

## 2024-05-17 DIAGNOSIS — I2089 Other forms of angina pectoris: Secondary | ICD-10-CM | POA: Diagnosis not present

## 2024-05-17 DIAGNOSIS — Z7902 Long term (current) use of antithrombotics/antiplatelets: Secondary | ICD-10-CM | POA: Diagnosis not present

## 2024-05-17 DIAGNOSIS — Z7982 Long term (current) use of aspirin: Secondary | ICD-10-CM | POA: Diagnosis not present

## 2024-05-17 DIAGNOSIS — Z79899 Other long term (current) drug therapy: Secondary | ICD-10-CM | POA: Diagnosis not present

## 2024-05-17 DIAGNOSIS — Z951 Presence of aortocoronary bypass graft: Secondary | ICD-10-CM | POA: Diagnosis not present

## 2024-05-20 DIAGNOSIS — Z79899 Other long term (current) drug therapy: Secondary | ICD-10-CM | POA: Diagnosis not present

## 2024-05-20 DIAGNOSIS — I2089 Other forms of angina pectoris: Secondary | ICD-10-CM | POA: Diagnosis not present

## 2024-05-20 DIAGNOSIS — I252 Old myocardial infarction: Secondary | ICD-10-CM | POA: Diagnosis not present

## 2024-05-20 DIAGNOSIS — Z7902 Long term (current) use of antithrombotics/antiplatelets: Secondary | ICD-10-CM | POA: Diagnosis not present

## 2024-05-20 DIAGNOSIS — Z7982 Long term (current) use of aspirin: Secondary | ICD-10-CM | POA: Diagnosis not present

## 2024-05-20 DIAGNOSIS — Z951 Presence of aortocoronary bypass graft: Secondary | ICD-10-CM | POA: Diagnosis not present

## 2024-05-22 DIAGNOSIS — I252 Old myocardial infarction: Secondary | ICD-10-CM | POA: Diagnosis not present

## 2024-05-22 DIAGNOSIS — Z7982 Long term (current) use of aspirin: Secondary | ICD-10-CM | POA: Diagnosis not present

## 2024-05-22 DIAGNOSIS — Z951 Presence of aortocoronary bypass graft: Secondary | ICD-10-CM | POA: Diagnosis not present

## 2024-05-22 DIAGNOSIS — I2089 Other forms of angina pectoris: Secondary | ICD-10-CM | POA: Diagnosis not present

## 2024-05-22 DIAGNOSIS — Z7902 Long term (current) use of antithrombotics/antiplatelets: Secondary | ICD-10-CM | POA: Diagnosis not present

## 2024-05-22 DIAGNOSIS — Z79899 Other long term (current) drug therapy: Secondary | ICD-10-CM | POA: Diagnosis not present

## 2024-05-23 ENCOUNTER — Other Ambulatory Visit: Payer: Self-pay | Admitting: Cardiology

## 2024-05-24 DIAGNOSIS — I2089 Other forms of angina pectoris: Secondary | ICD-10-CM | POA: Diagnosis not present

## 2024-05-24 DIAGNOSIS — Z951 Presence of aortocoronary bypass graft: Secondary | ICD-10-CM | POA: Diagnosis not present

## 2024-05-24 DIAGNOSIS — Z7982 Long term (current) use of aspirin: Secondary | ICD-10-CM | POA: Diagnosis not present

## 2024-05-24 DIAGNOSIS — I252 Old myocardial infarction: Secondary | ICD-10-CM | POA: Diagnosis not present

## 2024-05-24 DIAGNOSIS — Z7902 Long term (current) use of antithrombotics/antiplatelets: Secondary | ICD-10-CM | POA: Diagnosis not present

## 2024-05-24 DIAGNOSIS — Z79899 Other long term (current) drug therapy: Secondary | ICD-10-CM | POA: Diagnosis not present

## 2024-05-27 DIAGNOSIS — Z7982 Long term (current) use of aspirin: Secondary | ICD-10-CM | POA: Diagnosis not present

## 2024-05-27 DIAGNOSIS — Z7902 Long term (current) use of antithrombotics/antiplatelets: Secondary | ICD-10-CM | POA: Diagnosis not present

## 2024-05-27 DIAGNOSIS — Z951 Presence of aortocoronary bypass graft: Secondary | ICD-10-CM | POA: Diagnosis not present

## 2024-05-27 DIAGNOSIS — I2089 Other forms of angina pectoris: Secondary | ICD-10-CM | POA: Diagnosis not present

## 2024-05-27 DIAGNOSIS — I252 Old myocardial infarction: Secondary | ICD-10-CM | POA: Diagnosis not present

## 2024-05-27 DIAGNOSIS — Z79899 Other long term (current) drug therapy: Secondary | ICD-10-CM | POA: Diagnosis not present

## 2024-05-29 DIAGNOSIS — Z79899 Other long term (current) drug therapy: Secondary | ICD-10-CM | POA: Diagnosis not present

## 2024-05-29 DIAGNOSIS — Z7982 Long term (current) use of aspirin: Secondary | ICD-10-CM | POA: Diagnosis not present

## 2024-05-29 DIAGNOSIS — Z7902 Long term (current) use of antithrombotics/antiplatelets: Secondary | ICD-10-CM | POA: Diagnosis not present

## 2024-05-29 DIAGNOSIS — I252 Old myocardial infarction: Secondary | ICD-10-CM | POA: Diagnosis not present

## 2024-05-29 DIAGNOSIS — Z951 Presence of aortocoronary bypass graft: Secondary | ICD-10-CM | POA: Diagnosis not present

## 2024-05-29 DIAGNOSIS — I2089 Other forms of angina pectoris: Secondary | ICD-10-CM | POA: Diagnosis not present

## 2024-05-31 DIAGNOSIS — Z951 Presence of aortocoronary bypass graft: Secondary | ICD-10-CM | POA: Diagnosis not present

## 2024-05-31 DIAGNOSIS — I252 Old myocardial infarction: Secondary | ICD-10-CM | POA: Diagnosis not present

## 2024-05-31 DIAGNOSIS — Z7982 Long term (current) use of aspirin: Secondary | ICD-10-CM | POA: Diagnosis not present

## 2024-05-31 DIAGNOSIS — I2089 Other forms of angina pectoris: Secondary | ICD-10-CM | POA: Diagnosis not present

## 2024-05-31 DIAGNOSIS — Z79899 Other long term (current) drug therapy: Secondary | ICD-10-CM | POA: Diagnosis not present

## 2024-05-31 DIAGNOSIS — Z7902 Long term (current) use of antithrombotics/antiplatelets: Secondary | ICD-10-CM | POA: Diagnosis not present

## 2024-06-03 DIAGNOSIS — Z7982 Long term (current) use of aspirin: Secondary | ICD-10-CM | POA: Diagnosis not present

## 2024-06-03 DIAGNOSIS — Z79899 Other long term (current) drug therapy: Secondary | ICD-10-CM | POA: Diagnosis not present

## 2024-06-03 DIAGNOSIS — Z951 Presence of aortocoronary bypass graft: Secondary | ICD-10-CM | POA: Diagnosis not present

## 2024-06-03 DIAGNOSIS — I2089 Other forms of angina pectoris: Secondary | ICD-10-CM | POA: Diagnosis not present

## 2024-06-03 DIAGNOSIS — I252 Old myocardial infarction: Secondary | ICD-10-CM | POA: Diagnosis not present

## 2024-06-03 DIAGNOSIS — Z7902 Long term (current) use of antithrombotics/antiplatelets: Secondary | ICD-10-CM | POA: Diagnosis not present

## 2024-06-05 DIAGNOSIS — I2089 Other forms of angina pectoris: Secondary | ICD-10-CM | POA: Diagnosis not present

## 2024-06-05 DIAGNOSIS — Z7982 Long term (current) use of aspirin: Secondary | ICD-10-CM | POA: Diagnosis not present

## 2024-06-05 DIAGNOSIS — Z79899 Other long term (current) drug therapy: Secondary | ICD-10-CM | POA: Diagnosis not present

## 2024-06-05 DIAGNOSIS — Z951 Presence of aortocoronary bypass graft: Secondary | ICD-10-CM | POA: Diagnosis not present

## 2024-06-05 DIAGNOSIS — Z7902 Long term (current) use of antithrombotics/antiplatelets: Secondary | ICD-10-CM | POA: Diagnosis not present

## 2024-06-05 DIAGNOSIS — I252 Old myocardial infarction: Secondary | ICD-10-CM | POA: Diagnosis not present
